# Patient Record
Sex: Male | Born: 1950 | Race: White | Hispanic: No | Smoking: Never smoker
Health system: Southern US, Community
[De-identification: ages and names within clinical notes are randomized; demographics above are authoritative.]

## PROBLEM LIST (undated history)

## (undated) DIAGNOSIS — I480 Paroxysmal atrial fibrillation: Secondary | ICD-10-CM

## (undated) DIAGNOSIS — F32A Depression, unspecified: Secondary | ICD-10-CM

## (undated) DIAGNOSIS — D126 Benign neoplasm of colon, unspecified: Secondary | ICD-10-CM

## (undated) DIAGNOSIS — K5790 Diverticulosis of intestine, part unspecified, without perforation or abscess without bleeding: Secondary | ICD-10-CM

## (undated) DIAGNOSIS — E538 Deficiency of other specified B group vitamins: Secondary | ICD-10-CM

## (undated) DIAGNOSIS — E559 Vitamin D deficiency, unspecified: Secondary | ICD-10-CM

## (undated) DIAGNOSIS — H269 Unspecified cataract: Secondary | ICD-10-CM

## (undated) DIAGNOSIS — R635 Abnormal weight gain: Secondary | ICD-10-CM

## (undated) DIAGNOSIS — Z9889 Other specified postprocedural states: Secondary | ICD-10-CM

## (undated) DIAGNOSIS — F329 Major depressive disorder, single episode, unspecified: Secondary | ICD-10-CM

## (undated) DIAGNOSIS — E785 Hyperlipidemia, unspecified: Secondary | ICD-10-CM

## (undated) DIAGNOSIS — A770 Spotted fever due to Rickettsia rickettsii: Secondary | ICD-10-CM

## (undated) DIAGNOSIS — F419 Anxiety disorder, unspecified: Secondary | ICD-10-CM

## (undated) DIAGNOSIS — G459 Transient cerebral ischemic attack, unspecified: Secondary | ICD-10-CM

## (undated) DIAGNOSIS — M419 Scoliosis, unspecified: Secondary | ICD-10-CM

## (undated) DIAGNOSIS — I10 Essential (primary) hypertension: Secondary | ICD-10-CM

## (undated) DIAGNOSIS — C61 Malignant neoplasm of prostate: Secondary | ICD-10-CM

## (undated) HISTORY — DX: Vitamin D deficiency, unspecified: E55.9

## (undated) HISTORY — DX: Hyperlipidemia, unspecified: E78.5

## (undated) HISTORY — DX: Anxiety disorder, unspecified: F41.9

## (undated) HISTORY — DX: Spotted fever due to Rickettsia rickettsii: A77.0

## (undated) HISTORY — PX: HERNIA REPAIR: SHX51

## (undated) HISTORY — DX: Malignant neoplasm of prostate: C61

## (undated) HISTORY — DX: Essential (primary) hypertension: I10

## (undated) HISTORY — DX: Transient cerebral ischemic attack, unspecified: G45.9

## (undated) HISTORY — DX: Major depressive disorder, single episode, unspecified: F32.9

## (undated) HISTORY — DX: Unspecified cataract: H26.9

## (undated) HISTORY — DX: Other specified postprocedural states: Z98.890

## (undated) HISTORY — DX: Abnormal weight gain: R63.5

## (undated) HISTORY — DX: Deficiency of other specified B group vitamins: E53.8

## (undated) HISTORY — DX: Scoliosis, unspecified: M41.9

## (undated) HISTORY — PX: CATARACT EXTRACTION W/ INTRAOCULAR LENS  IMPLANT, BILATERAL: SHX1307

## (undated) HISTORY — PX: NASAL SEPTUM SURGERY: SHX37

## (undated) HISTORY — DX: Diverticulosis of intestine, part unspecified, without perforation or abscess without bleeding: K57.90

## (undated) HISTORY — DX: Paroxysmal atrial fibrillation: I48.0

## (undated) HISTORY — DX: Depression, unspecified: F32.A

## (undated) HISTORY — DX: Benign neoplasm of colon, unspecified: D12.6

---

## 1999-08-22 ENCOUNTER — Ambulatory Visit (HOSPITAL_COMMUNITY): Admission: RE | Admit: 1999-08-22 | Discharge: 1999-08-22 | Payer: Self-pay | Admitting: Cardiology

## 2004-05-04 ENCOUNTER — Encounter (INDEPENDENT_AMBULATORY_CARE_PROVIDER_SITE_OTHER): Payer: Self-pay | Admitting: Specialist

## 2004-05-04 ENCOUNTER — Ambulatory Visit (HOSPITAL_COMMUNITY): Admission: RE | Admit: 2004-05-04 | Discharge: 2004-05-04 | Payer: Self-pay | Admitting: General Surgery

## 2004-12-11 ENCOUNTER — Ambulatory Visit: Payer: Self-pay | Admitting: Internal Medicine

## 2005-01-11 ENCOUNTER — Ambulatory Visit: Payer: Self-pay | Admitting: Gastroenterology

## 2005-01-24 ENCOUNTER — Ambulatory Visit: Payer: Self-pay | Admitting: Gastroenterology

## 2005-01-24 HISTORY — PX: COLONOSCOPY: SHX174

## 2006-11-21 ENCOUNTER — Ambulatory Visit (HOSPITAL_BASED_OUTPATIENT_CLINIC_OR_DEPARTMENT_OTHER): Admission: RE | Admit: 2006-11-21 | Discharge: 2006-11-21 | Payer: Self-pay | Admitting: General Surgery

## 2006-11-21 ENCOUNTER — Encounter (INDEPENDENT_AMBULATORY_CARE_PROVIDER_SITE_OTHER): Payer: Self-pay | Admitting: Specialist

## 2007-01-14 ENCOUNTER — Inpatient Hospital Stay (HOSPITAL_COMMUNITY): Admission: EM | Admit: 2007-01-14 | Discharge: 2007-01-15 | Payer: Self-pay | Admitting: Emergency Medicine

## 2007-10-20 ENCOUNTER — Ambulatory Visit: Payer: Self-pay | Admitting: Internal Medicine

## 2007-10-21 LAB — CONVERTED CEMR LAB
ALT: 14 units/L (ref 0–53)
Albumin: 3.6 g/dL (ref 3.5–5.2)
Bacteria, UA: NEGATIVE
CO2: 30 meq/L (ref 19–32)
Calcium: 9 mg/dL (ref 8.4–10.5)
Cholesterol: 180 mg/dL (ref 0–200)
Creatinine, Ser: 1.1 mg/dL (ref 0.4–1.5)
Crystals: NEGATIVE
GFR calc non Af Amer: 74 mL/min
Glucose, Bld: 112 mg/dL — ABNORMAL HIGH (ref 70–99)
HCT: 44.4 % (ref 39.0–52.0)
HDL: 54.4 mg/dL (ref >39.0)
Hemoglobin: 15.6 g/dL (ref 13.0–17.0)
Ketones, ur: NEGATIVE mg/dL
LDL Cholesterol: 114 mg/dL — ABNORMAL HIGH (ref 0–99)
Leukocytes, UA: NEGATIVE
Lymphocytes Relative: 20.6 % (ref 12.0–46.0)
MCHC: 35.1 g/dL (ref 30.0–36.0)
MCV: 98.2 fL (ref 78.0–100.0)
Nitrite: NEGATIVE
Potassium: 4.6 meq/L (ref 3.5–5.1)
RBC: 4.52 M/uL (ref 4.22–5.81)
RDW: 11.7 % (ref 11.5–14.6)
Sodium: 138 meq/L (ref 135–145)
Specific Gravity, Urine: >=1.03 (ref 1.000–1.03)
TSH: 0.87 microintl units/mL (ref 0.35–5.50)
Urine Glucose: NEGATIVE mg/dL
WBC: 4.7 10*3/uL (ref 4.5–10.5)

## 2007-10-24 ENCOUNTER — Ambulatory Visit: Payer: Self-pay | Admitting: Internal Medicine

## 2007-10-24 DIAGNOSIS — I4891 Unspecified atrial fibrillation: Secondary | ICD-10-CM | POA: Insufficient documentation

## 2007-10-24 DIAGNOSIS — R972 Elevated prostate specific antigen [PSA]: Secondary | ICD-10-CM | POA: Insufficient documentation

## 2007-10-24 DIAGNOSIS — E785 Hyperlipidemia, unspecified: Secondary | ICD-10-CM

## 2007-10-24 DIAGNOSIS — N529 Male erectile dysfunction, unspecified: Secondary | ICD-10-CM | POA: Insufficient documentation

## 2007-10-24 DIAGNOSIS — N419 Inflammatory disease of prostate, unspecified: Secondary | ICD-10-CM | POA: Insufficient documentation

## 2007-10-24 DIAGNOSIS — F329 Major depressive disorder, single episode, unspecified: Secondary | ICD-10-CM | POA: Insufficient documentation

## 2008-03-02 ENCOUNTER — Ambulatory Visit: Payer: Self-pay | Admitting: Internal Medicine

## 2008-03-02 DIAGNOSIS — M66259 Spontaneous rupture of extensor tendons, unspecified thigh: Secondary | ICD-10-CM

## 2008-04-20 ENCOUNTER — Ambulatory Visit: Payer: Self-pay | Admitting: Internal Medicine

## 2008-04-20 LAB — CONVERTED CEMR LAB
BUN: 11 mg/dL (ref 6–23)
Calcium: 8.7 mg/dL (ref 8.4–10.5)
Creatinine, Ser: 1 mg/dL (ref 0.4–1.5)
Glucose, Bld: 108 mg/dL — ABNORMAL HIGH (ref 70–99)
Hgb A1c MFr Bld: 5.4 % (ref 4.6–6.0)

## 2008-04-23 ENCOUNTER — Ambulatory Visit: Payer: Self-pay | Admitting: Internal Medicine

## 2008-06-07 ENCOUNTER — Telehealth: Payer: Self-pay | Admitting: Internal Medicine

## 2008-07-09 ENCOUNTER — Telehealth: Payer: Self-pay | Admitting: Internal Medicine

## 2008-07-10 ENCOUNTER — Ambulatory Visit: Payer: Self-pay | Admitting: Internal Medicine

## 2008-07-10 DIAGNOSIS — R197 Diarrhea, unspecified: Secondary | ICD-10-CM

## 2008-07-11 ENCOUNTER — Encounter: Payer: Self-pay | Admitting: Internal Medicine

## 2008-07-12 ENCOUNTER — Telehealth: Payer: Self-pay | Admitting: Internal Medicine

## 2008-07-22 ENCOUNTER — Encounter: Payer: Self-pay | Admitting: Internal Medicine

## 2008-07-26 ENCOUNTER — Encounter: Payer: Self-pay | Admitting: Internal Medicine

## 2008-09-07 ENCOUNTER — Encounter: Payer: Self-pay | Admitting: Internal Medicine

## 2008-09-15 ENCOUNTER — Encounter: Payer: Self-pay | Admitting: Internal Medicine

## 2008-11-12 HISTORY — PX: PROSTATECTOMY: SHX69

## 2008-11-18 ENCOUNTER — Encounter (INDEPENDENT_AMBULATORY_CARE_PROVIDER_SITE_OTHER): Payer: Self-pay | Admitting: Urology

## 2008-11-18 ENCOUNTER — Inpatient Hospital Stay (HOSPITAL_COMMUNITY): Admission: RE | Admit: 2008-11-18 | Discharge: 2008-11-19 | Payer: Self-pay | Admitting: Urology

## 2008-11-26 ENCOUNTER — Encounter: Payer: Self-pay | Admitting: Internal Medicine

## 2008-12-17 ENCOUNTER — Telehealth: Payer: Self-pay | Admitting: Internal Medicine

## 2009-08-10 ENCOUNTER — Telehealth: Payer: Self-pay | Admitting: Internal Medicine

## 2009-08-11 ENCOUNTER — Ambulatory Visit: Payer: Self-pay | Admitting: Internal Medicine

## 2009-08-11 LAB — CONVERTED CEMR LAB
AST: 15 units/L (ref 0–37)
Albumin: 3.9 g/dL (ref 3.5–5.2)
Alkaline Phosphatase: 53 units/L (ref 39–117)
Bilirubin, Direct: 0.2 mg/dL (ref 0.0–0.3)
CO2: 33 meq/L — ABNORMAL HIGH (ref 19–32)
Calcium: 8.7 mg/dL (ref 8.4–10.5)
Cholesterol: 177 mg/dL (ref 0–200)
Eosinophils Absolute: 0 10*3/uL (ref 0.0–0.7)
Eosinophils Relative: 0.9 % (ref 0.0–5.0)
Glucose, Bld: 97 mg/dL (ref 70–99)
Ketones, ur: NEGATIVE mg/dL
LDL Cholesterol: 114 mg/dL — ABNORMAL HIGH (ref 0–99)
Lymphocytes Relative: 26.5 % (ref 12.0–46.0)
MCHC: 34.2 g/dL (ref 30.0–36.0)
MCV: 101.4 fL — ABNORMAL HIGH (ref 78.0–100.0)
Monocytes Absolute: 0.3 10*3/uL (ref 0.1–1.0)
Platelets: 145 10*3/uL — ABNORMAL LOW (ref 150.0–400.0)
RDW: 12.2 % (ref 11.5–14.6)
Sodium: 140 meq/L (ref 135–145)
Total Protein: 6.4 g/dL (ref 6.0–8.3)
Urobilinogen, UA: 0.2 (ref 0.0–1.0)
WBC: 4.1 10*3/uL — ABNORMAL LOW (ref 4.5–10.5)

## 2009-08-15 ENCOUNTER — Ambulatory Visit: Payer: Self-pay | Admitting: Internal Medicine

## 2009-08-15 DIAGNOSIS — R490 Dysphonia: Secondary | ICD-10-CM | POA: Insufficient documentation

## 2009-08-15 DIAGNOSIS — R799 Abnormal finding of blood chemistry, unspecified: Secondary | ICD-10-CM | POA: Insufficient documentation

## 2009-08-15 DIAGNOSIS — Z8546 Personal history of malignant neoplasm of prostate: Secondary | ICD-10-CM

## 2009-08-18 LAB — CONVERTED CEMR LAB: Vitamin B-12: 191 pg/mL — ABNORMAL LOW (ref 211–911)

## 2009-08-19 ENCOUNTER — Ambulatory Visit: Payer: Self-pay | Admitting: Internal Medicine

## 2009-08-19 DIAGNOSIS — E538 Deficiency of other specified B group vitamins: Secondary | ICD-10-CM | POA: Insufficient documentation

## 2009-09-05 ENCOUNTER — Ambulatory Visit: Payer: Self-pay | Admitting: Internal Medicine

## 2009-09-19 ENCOUNTER — Encounter: Payer: Self-pay | Admitting: Internal Medicine

## 2009-11-29 ENCOUNTER — Ambulatory Visit: Payer: Self-pay | Admitting: Internal Medicine

## 2009-12-01 LAB — CONVERTED CEMR LAB
Basophils Absolute: 0 10*3/uL (ref 0.0–0.1)
CO2: 31 meq/L (ref 19–32)
Chloride: 108 meq/L (ref 96–112)
HCT: 48 % (ref 39.0–52.0)
Hemoglobin: 15.9 g/dL (ref 13.0–17.0)
Lymphs Abs: 0.9 10*3/uL (ref 0.7–4.0)
MCV: 100.5 fL — ABNORMAL HIGH (ref 78.0–100.0)
Monocytes Relative: 6.8 % (ref 3.0–12.0)
Neutro Abs: 2.5 10*3/uL (ref 1.4–7.7)
Potassium: 4.3 meq/L (ref 3.5–5.1)
Sodium: 142 meq/L (ref 135–145)
Vitamin B-12: 467 pg/mL (ref 211–911)
WBC: 3.7 10*3/uL — ABNORMAL LOW (ref 4.5–10.5)

## 2009-12-09 ENCOUNTER — Encounter: Payer: Self-pay | Admitting: Internal Medicine

## 2010-01-14 ENCOUNTER — Ambulatory Visit: Payer: Self-pay | Admitting: Family Medicine

## 2010-01-14 DIAGNOSIS — J159 Unspecified bacterial pneumonia: Secondary | ICD-10-CM | POA: Insufficient documentation

## 2010-01-16 ENCOUNTER — Telehealth: Payer: Self-pay | Admitting: Internal Medicine

## 2010-02-10 ENCOUNTER — Ambulatory Visit: Payer: Self-pay | Admitting: Internal Medicine

## 2010-02-10 DIAGNOSIS — R002 Palpitations: Secondary | ICD-10-CM

## 2010-02-13 DIAGNOSIS — F411 Generalized anxiety disorder: Secondary | ICD-10-CM

## 2010-02-13 DIAGNOSIS — F419 Anxiety disorder, unspecified: Secondary | ICD-10-CM | POA: Insufficient documentation

## 2010-03-21 ENCOUNTER — Ambulatory Visit: Payer: Self-pay | Admitting: Internal Medicine

## 2010-03-22 LAB — CONVERTED CEMR LAB
CO2: 31 meq/L (ref 19–32)
Calcium: 8.8 mg/dL (ref 8.4–10.5)
GFR calc non Af Amer: 71.03 mL/min (ref >60)
TSH: 1.04 microintl units/mL (ref 0.35–5.50)
Vitamin B-12: 499 pg/mL (ref 211–911)

## 2010-03-30 ENCOUNTER — Ambulatory Visit: Payer: Self-pay | Admitting: Internal Medicine

## 2010-03-30 DIAGNOSIS — E559 Vitamin D deficiency, unspecified: Secondary | ICD-10-CM | POA: Insufficient documentation

## 2010-06-02 ENCOUNTER — Encounter: Payer: Self-pay | Admitting: Internal Medicine

## 2010-08-08 ENCOUNTER — Ambulatory Visit: Payer: Self-pay | Admitting: Cardiology

## 2010-08-11 ENCOUNTER — Telehealth: Payer: Self-pay | Admitting: Internal Medicine

## 2010-08-15 ENCOUNTER — Ambulatory Visit: Payer: Self-pay | Admitting: Internal Medicine

## 2010-09-20 ENCOUNTER — Ambulatory Visit: Payer: Self-pay | Admitting: Internal Medicine

## 2010-11-07 ENCOUNTER — Ambulatory Visit
Admission: RE | Admit: 2010-11-07 | Discharge: 2010-11-07 | Payer: Self-pay | Source: Home / Self Care | Attending: Internal Medicine | Admitting: Internal Medicine

## 2010-11-07 LAB — CONVERTED CEMR LAB
ALT: 16 units/L (ref 0–53)
BUN: 23 mg/dL (ref 6–23)
CO2: 30 meq/L (ref 19–32)
Calcium: 8.6 mg/dL (ref 8.4–10.5)
Cholesterol: 187 mg/dL (ref 0–200)
Glucose, Bld: 86 mg/dL (ref 70–99)
HCT: 46.3 % (ref 39.0–52.0)
Hemoglobin: 15.9 g/dL (ref 13.0–17.0)
Leukocytes, UA: NEGATIVE
MCHC: 34.3 g/dL (ref 30.0–36.0)
Monocytes Absolute: 0.3 10*3/uL (ref 0.1–1.0)
Neutrophils Relative %: 82 % — ABNORMAL HIGH (ref 43.0–77.0)
Nitrite: NEGATIVE
PSA: 0.01 ng/mL — ABNORMAL LOW (ref 0.10–4.00)
Platelets: 173 10*3/uL (ref 150.0–400.0)
RDW: 13.3 % (ref 11.5–14.6)
Sodium: 139 meq/L (ref 135–145)
Specific Gravity, Urine: >=1.03 (ref 1.000–1.030)
TSH: 0.46 microintl units/mL (ref 0.35–5.50)
Total CHOL/HDL Ratio: 4
Total Protein, Urine: NEGATIVE mg/dL
Urine Glucose: NEGATIVE mg/dL
pH: 5.5 (ref 5.0–8.0)

## 2010-11-08 ENCOUNTER — Ambulatory Visit: Payer: Self-pay | Admitting: Internal Medicine

## 2010-11-08 ENCOUNTER — Encounter: Payer: Self-pay | Admitting: Internal Medicine

## 2010-12-06 ENCOUNTER — Encounter: Payer: Self-pay | Admitting: Internal Medicine

## 2010-12-12 NOTE — Assessment & Plan Note (Signed)
Summary: 4 MO ROV /NWS  #   Vital Signs:  Patient profile:   60 year old male Height:      68 inches Weight:      160 pounds BMI:     24.42 O2 Sat:      97 % on Room air Temp:     97.9 degrees F oral Pulse rate:   58 / minute BP sitting:   120 / 72  (left arm) Cuff size:   large  Vitals Entered By: Lucious Groves (Mar 30, 2010 10:13 AM)  O2 Flow:  Room air CC: 4 mo rtn ov/follow up./kb Is Patient Diabetic? No Pain Assessment Patient in pain? no        Primary Care Provider:  Georgina Quint Plotnikov MD  CC:  4 mo rtn ov/follow up./kb.  History of Present Illness: F/u anxiety, depression, palpitations  Current Medications (verified): 1)  Lipitor 10 Mg Tabs (Atorvastatin Calcium) .Marland Kitchen.. 1 By Mouth Qd 2)  Aspir-Low 81 Mg Tbec (Aspirin) .Marland Kitchen.. 1 Once Daily Pc 3)  Flecainide Acetate 100 Mg  Tabs (Flecainide Acetate) .Marland Kitchen.. 1 By Mouth Bid 4)  Atenolol 50 Mg Tabs (Atenolol) .Marland Kitchen.. 1 By Mouth Qd 5)  Levitra 20 Mg Tabs (Vardenafil Hcl) .... 1/2-1 Once Daily As Needed 6)  Vitamin D3 1000 Unit  Tabs (Cholecalciferol) .Marland Kitchen.. 1 By Mouth Daily 7)  Nascobal 500 Mcg/0.59ml Soln (Cyanocobalamin) .... As Dirrected 8)  Zoloft 100 Mg Tabs (Sertraline Hcl) .Marland Kitchen.. 1 By Mouth Qd 9)  Alprazolam 0.5 Mg Tabs (Alprazolam) .Marland Kitchen.. 1 By Mouth Two Times A Day As Needed Anxiety  Allergies (verified): 1)  ! Clonidine Hcl 2)  ! 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    Physical Exam  General:   Well-developed,well-nourished,in no acute distress; alert,appropriate and cooperative throughout examination Eyes:  No corneal or conjunctival inflammation noted. EOMI. Perrla.  Nose:  no external deformity.   Mouth:  Oral mucosa and oropharynx without lesions or exudates.  Teeth in good repair. Lungs:  Normal respiratory effort, chest expands symmetrically. bilateral crackles in lung bases. No rhonchi. No wheezes. Heart:  Normal rate and regular rhythm. S1 and S2 normal without gallop, murmur, click, rub or other extra sounds. Abdomen:  Bowel sounds positive,abdomen soft and non-tender without masses, organomegaly or hernias noted. Msk:  LS NT Extremities:  No clubbing, cyanosis, edema, or deformity noted with normal full range of motion of all joints.   Neurologic:  alert & oriented X3 and gait normal.   Skin:  turgor normal, color normal, no rashes, and no suspicious lesions.   Psych:  Cognition and judgment appear intact. Alert and cooperative with normal attention span and concentration. No apparent delusions, illusions, hallucinations   Impression & Recommendations:  Problem # 1:  ANXIETY (ICD-300.00) Assessment Improved  His updated medication list for this problem includes:    Zoloft 100 Mg Tabs (Sertraline hcl) .Marland Kitchen... 1 by mouth qd    Alprazolam 0.5 Mg Tabs (Alprazolam) .Marland Kitchen... 1 by mouth two times a day as needed anxiety  Problem #  2:  B12 DEFICIENCY (ICD-266.2) Assessment: Improved  Problem # 3:  DEPRESSION (ICD-311) Assessment: Improved  His updated medication list for this problem includes:    Zoloft 100 Mg Tabs (Sertraline hcl) .Marland Kitchen... 1 by mouth qd    Alprazolam 0.5 Mg Tabs (Alprazolam) .Marland Kitchen... 1 by mouth two times a day as needed anxiety  Problem # 4:  VITAMIN D DEFICIENCY (ICD-268.9) Assessment: Improved The labs were reviewed with the patient. Risks of noncompliance with treatment discussed. Compliance encouraged.  Problem # 5:  PALPITATIONS (ICD-785.1) Assessment:  Improved  His updated medication list for this problem includes:    Atenolol 50 Mg Tabs (Atenolol) .Marland Kitchen... 1 by mouth qd  Problem # 6:  ATRIAL FIBRILLATION (ICD-427.31) Assessment: Unchanged  His updated medication list for this problem includes:    Aspir-low 81 Mg Tbec (Aspirin) .Marland Kitchen... 1 once daily pc    Flecainide Acetate 100 Mg Tabs (Flecainide acetate) .Marland Kitchen... 1 by mouth bid    Atenolol 50 Mg Tabs (Atenolol) .Marland Kitchen... 1 by mouth qd  Complete Medication List: 1)  Lipitor 10 Mg Tabs (Atorvastatin calcium) .Marland Kitchen.. 1 by mouth qd 2)  Aspir-low 81 Mg Tbec (Aspirin) .Marland Kitchen.. 1 once daily pc 3)  Flecainide Acetate 100 Mg Tabs (Flecainide acetate) .Marland Kitchen.. 1 by mouth bid 4)  Atenolol 50 Mg Tabs (Atenolol) .Marland Kitchen.. 1 by mouth qd 5)  Levitra 20 Mg Tabs (Vardenafil hcl) .... 1/2-1 once daily as needed 6)  Vitamin D3 1000 Unit Tabs (Cholecalciferol) .Marland Kitchen.. 1 by mouth daily 7)  Nascobal 500 Mcg/0.49ml Soln (Cyanocobalamin) .... As dirrected 8)  Zoloft 100 Mg Tabs (Sertraline hcl) .Marland Kitchen.. 1 by mouth qd 9)  Alprazolam 0.5 Mg Tabs (Alprazolam) .Marland Kitchen.. 1 by mouth two times a day as needed anxiety  Patient Instructions: 1)  Please schedule a follow-up appointment in 6 months well w/labs and vit D v70.0  268.9. Prescriptions: LIPITOR 10 MG TABS (ATORVASTATIN CALCIUM) 1 by mouth qd  #30 x 12   Entered and Authorized by:   [name] MD   Signed by:   [name] MD on 03/30/2010   Method used:   Print then Give to Patient   RxID:   [id number]

## 2010-12-12 NOTE — Assessment & Plan Note (Signed)
Summary: PER KELLY--INCREASE BP--DIFFICULTY EATING--STC   Vital Signs:  Patient profile:   60 year old male Height:      68 inches Weight:      161 pounds BMI:     24.57 O2 Sat:      97 % on Room air Temp:     97.0 degrees F oral Pulse rate:   56 / minute Pulse rhythm:   regular BP sitting:   118 / 70  (left arm) Cuff size:   large  Vitals Entered By: Rock Nephew CMA (February 10, 2010 4:15 PM)  O2 Flow:  Room air CC: discuss lack of appetite and anxiety Is Patient Diabetic? No Pain Assessment Patient in pain? no        Primary Care Provider:  Georgina Quint Plotnikov MD  CC:  discuss lack of appetite and anxiety.  History of Present Illness: C/o anxiety - worse C/o palpitations; had a CP on Fri or Sat x 1 sec. He had a CL 1-1.5 y ago.  Current Medications (verified): 1)  Lipitor 10 Mg Tabs (Atorvastatin Calcium) .Marland Kitchen.. 1 By Mouth Qd 2)  Aspir-Low 81 Mg Tbec (Aspirin) .Marland Kitchen.. 1 Once Daily Pc 3)  Flecainide Acetate 100 Mg  Tabs (Flecainide Acetate) .Marland Kitchen.. 1 By Mouth Bid 4)  Atenolol 50 Mg Tabs (Atenolol) .Marland Kitchen.. 1 By Mouth Qd 5)  Levitra 20 Mg Tabs (Vardenafil Hcl) .... 1/2-1 Once Daily As Needed 6)  Zoloft 50 Mg Tabs (Sertraline Hcl) .Marland Kitchen.. 1 By Mouth Daily 7)  Vitamin D3 1000 Unit  Tabs (Cholecalciferol) .Marland Kitchen.. 1 By Mouth Daily 8)  Nascobal 500 Mcg/0.77ml Soln (Cyanocobalamin) .... As Dirrected  Allergies (verified): 1)  ! Clonidine Hcl 2)  ! 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    Physical Exam  General:   Well-developed,well-nourished,in no acute distress; alert,appropriate and cooperative throughout examination Nose:  no external deformity.   Mouth:  Oral mucosa and oropharynx without lesions or exudates.  Teeth in good repair. Neck:  No deformities, masses, or tenderness noted. Lungs:  Normal respiratory effort, chest expands symmetrically. bilateral crackles in lung bases. No rhonchi. No wheezes. Heart:  Normal rate and regular rhythm. S1 and S2 normal without gallop, murmur, click, rub or other extra sounds. Abdomen:  Bowel sounds positive,abdomen soft and non-tender without masses, organomegaly or hernias noted. Msk:  LS NT Neurologic:  alert & oriented X3 and gait normal.   Skin:  turgor normal, color normal, no rashes, and no suspicious lesions.   Psych:  Cognition and judgment appear intact. Alert and cooperative with normal attention span and concentration. No apparent delusions, illusions, hallucinations   Impression & Recommendations:  Problem # 1:  ATRIAL FIBRILLATION (ICD-427.31) - in NSR Assessment Improved  His updated medication list for this problem includes:    Aspir-low 81 Mg Tbec (Aspirin) .Marland Kitchen... 1 once daily pc    Flecainide Acetate 100 Mg Tabs (Flecainide acetate) .Marland Kitchen... 1 by mouth bid    Atenolol 50 Mg Tabs (Atenolol) .Marland Kitchen... 1 by mouth qd  Problem # 2:  B12 DEFICIENCY (ICD-266.2) Assessment: Improved On prescription/OTC  therapy  Problem # 3:  DEPRESSION (ICD-311)/ anxiety Assessment: Deteriorated  Alprazolam prn Increase Zoloft  Complete Medication List: 1)  Lipitor 10 Mg Tabs (Atorvastatin calcium) .Marland Kitchen.. 1 by mouth qd 2)  Aspir-low 81 Mg Tbec (Aspirin) .Marland Kitchen.. 1 once daily pc 3)  Flecainide Acetate 100 Mg Tabs (Flecainide acetate) .Marland Kitchen.. 1 by mouth bid 4)  Atenolol 50 Mg Tabs (Atenolol) .Marland Kitchen.. 1 by mouth qd 5)  Levitra 20 Mg Tabs (Vardenafil hcl) .... 1/2-1 once daily as needed 6)  Vitamin D3 1000 Unit Tabs (Cholecalciferol) .Marland Kitchen.. 1 by mouth daily 7)  Nascobal 500  Mcg/0.87ml Soln (Cyanocobalamin) .... As dirrected 8)  Zoloft 100 Mg Tabs (Sertraline hcl) .Marland Kitchen.. 1 by mouth qd 9)  Alprazolam 0.5 Mg Tabs (Alprazolam) .Marland Kitchen.. 1 by mouth two times a day as needed anxiety  Other Orders: EKG w/ Interpretation (93000)  Patient Instructions: 1)  Please schedule a follow-up appointment in 3 months. 2)  BMP prior to visit, ICD-9: 3)  Vit B12 4)  TSH 266.20 786.50 Prescriptions: ALPRAZOLAM 0.5 MG TABS (ALPRAZOLAM) 1 by mouth two times a day as needed anxiety  #60 x 1   Entered and Authorized by:   [name] MD   Signed by:   [name] MD on 02/10/2010   Method used:   Print then Give to Patient   RxID:   [id number] ZOLOFT 100 MG TABS (SERTRALINE HCL) 1 by mouth qd  #30 x 6   Entered and Authorized by:   [name] MD   Signed by:   [name] MD on 02/10/2010   Method used:   Print then Give to Patient   RxID:   [id number]   Laboratory Results

## 2010-12-12 NOTE — Assessment & Plan Note (Signed)
Summary: 3 MOS F/U #/CD   Vital Signs:  Patient profile:   60 year old male Weight:      163 pounds Temp:     96.7 degrees F oral Pulse rate:   65 / minute BP sitting:   144 / 76  (left arm)  Vitals Entered By: Tora Perches (November 29, 2009 9:38 AM) CC: f/u Is Patient Diabetic? No   Primary Care Provider:  [name] MD  CC:  f/u.  History of Present Illness: F/u  B12 def, A fib, prostate CA  Preventive Screening-Counseling & Management  Alcohol-Tobacco     Smoking Status: never  Current Medications (verified): 1)  Lipitor 10 Mg Tabs (Atorvastatin Calcium) .Marland Kitchen.. 1 By Mouth Qd 2)  Aspir-Low 81 Mg Tbec (Aspirin) .Marland Kitchen.. 1 Once Daily Pc 3)  Flecainide Acetate 100 Mg  Tabs (Flecainide Acetate) .Marland Kitchen.. 1 By Mouth Bid 4)  Atenolol 50 Mg Tabs (Atenolol) .Marland Kitchen.. 1 By Mouth Qd 5)  Levitra 20 Mg Tabs (Vardenafil Hcl) .... 1/2-1 Once Daily As Needed 6)  Zoloft 50 Mg Tabs (Sertraline Hcl) .Marland Kitchen.. 1 By Mouth Daily 7)  Vitamin D3 1000 Unit  Tabs (Cholecalciferol) .Marland Kitchen.. 1 By Mouth Daily 8)  Calomist .... 1 Spr Each Nostril Qd  Allergies: 1)  ! Clonidine Hcl 2)  ! 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  The patient denies fever, abdominal pain, melena, muscle weakness, difficulty walking, and breast masses.         Better  Physical Exam  General:  Well-developed,well-nourished,in no acute distress; alert,appropriate and cooperative throughout examination Mouth:  Oral mucosa and  oropharynx without lesions or exudates.  Teeth in good repair. Neck:  No deformities, masses, or tenderness noted. Heart:  Normal rate and regular rhythm. S1 and S2 normal without gallop, murmur, click, rub or other extra sounds. Abdomen:  Bowel sounds positive,abdomen soft and non-tender without masses, organomegaly or hernias noted. Msk:  LS NT Neurologic:  alert & oriented X3 and gait normal.  cranial nerves II-XII intact, strength normal in all extremities, and DTRs symmetrical and normal.   Skin:  turgor normal, color normal, no rashes, and no suspicious lesions.   Psych:  Oriented X3, not depressed appearing, not agitated, and not suicidal.     Impression & Recommendations:  Problem # 1:  B12 DEFICIENCY (ICD-266.2) Assessment Improved  On prescription therapy   Orders: TLB-B12, Serum-Total ONLY (16109-U04) TLB-CBC Platelet - w/Differential (85025-CBCD) TLB-BMP (Basic Metabolic Panel-BMET) (80048-METABOL)  Problem # 2:  CBC, ABNORMAL (ICD-790.99) Assessment: Comment Only Get labs  Problem # 3:  PROSTATE CANCER, HX OF (ICD-V10.46) Assessment: Comment Only  Problem # 4:  ATRIAL FIBRILLATION (ICD-427.31) Assessment: Unchanged  His updated medication list for this problem includes:    Aspir-low 81 Mg Tbec (Aspirin) .Marland Kitchen... 1 once daily pc    Flecainide Acetate 100 Mg Tabs (Flecainide acetate) .Marland Kitchen... 1 by mouth bid    Atenolol 50 Mg Tabs (Atenolol) .Marland Kitchen... 1 by mouth qd  Complete Medication List: 1)  Lipitor 10 Mg Tabs (Atorvastatin calcium) .Marland KitchenMarland KitchenMarland Kitchen  1 by mouth qd 2)  Aspir-low 81 Mg Tbec (Aspirin) .Marland Kitchen.. 1 once daily pc 3)  Flecainide Acetate 100 Mg Tabs (Flecainide acetate) .Marland Kitchen.. 1 by mouth bid 4)  Atenolol 50 Mg Tabs (Atenolol) .Marland Kitchen.. 1 by mouth qd 5)  Levitra 20 Mg Tabs (Vardenafil hcl) .... 1/2-1 once daily as needed 6)  Zoloft 50 Mg Tabs (Sertraline hcl) .Marland Kitchen.. 1 by mouth daily 7)  Vitamin D3 1000 Unit Tabs (Cholecalciferol) .Marland Kitchen.. 1 by mouth daily 8)  Nascobal 500 Mcg/0.27ml Soln  (Cyanocobalamin) .... As dirrected  Patient Instructions: 1)  Please schedule a follow-up appointment in 4 months. 2)  BMP prior to visit, ICD-9: 3)  TSH prior to visit, ICD-9:266.20 4)  Vit D Prescriptions: NASCOBAL 500 MCG/0.1ML SOLN (CYANOCOBALAMIN) as dirrected  #1 x 12   Entered and Authorized by:   [name] MD   Signed by:   [name] MD on 11/29/2009   Method used:   Print then Give to Patient   RxID:   [id number]

## 2010-12-12 NOTE — Progress Notes (Signed)
Summary: Call A NURSE  Phone Note From Other Clinic   Summary of Call: [facility] Triage Call Report [place] Martes Operator: [id number] Triage Record Num: Call Date & Time: Haston Casebolt Patient Name: 01/14/2010 10:53:49AM PCP: [phone] Patient Phone: PCP Fax : Patient Gender: Male 1951-08-31 Patient DOB: Practice Name: [facility] Reason for Call: Pt sts that he has had a fever x 4 days and is requesting appt; declines triage. Transferred to Lupita Leash in office for appt. Office Note Protocol(s) Used: Information Noted and Sent to Office Recommended Outcome per Protocol: Reason for Outcome: Caller information to office Initial call taken by: Lamar Sprinkles, CMA,  January 16, 2010 1:38 PM

## 2010-12-12 NOTE — Assessment & Plan Note (Signed)
Summary: FEVER/COLD/DLO   Vital Signs:  Patient profile:   60 year old male Weight:      165 pounds Temp:     100.4 degrees F oral Pulse rate:   88 / minute Pulse rhythm:   regular BP sitting:   140 / 64  (left arm) Cuff size:   regular  Vitals Entered By: Lowella Petties CMA (January 14, 2010 12:12 PM) CC: Flu like sxs, fever   Primary Care Provider:  [name] MD   History of Present Illness: 60 year old male:  2 weeks ago, got some col dor flu  Wed has had a fever, got up to almost 102 Some congestion and headache.  coughing, has had some  sputum production.  Fever, to a MAXIMUM TEMPERATURE of 102. No nausea, vomiting, diarrhea, minimal bodyaches.  No significant sore throat. No earache.  Review systems as above: No rashes:  No night sweats: Patient is on multiple cardiac medications.Marland Kitchen  He is having some mild nasal congestion  Allergies: 1)  ! Clonidine Hcl 2)  ! Procainamide Hcl (Procainamide Hcl)  Past History:  Past medical, surgical, family and social histories (including risk factors) reviewed, and no changes noted (except as noted below).  Past Medical History: Reviewed history from 11/29/2009 and no changes required.  Atrial fibrillation  Dr Deborah Chalk Depression h/o Hyperlipidemia H/o prostatitis Part rupture of l quad 2008 Prostate cancer 2010 [name] Vit B12 def 2010  Past Surgical History: Reviewed history from 08/15/2009 and no changes required. Prostatectomy, robotic 11/2008  Family History: Reviewed history from 10/24/2007 and no changes required. Family History Lung cancer Family History of Prostate CA 1st degree relative <50  Social History: Reviewed history from 08/19/2009 and no changes required. 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 No apparent alopecia or balding. Ears:  External ear exam shows no significant lesions or deformities.  Otoscopic examination reveals clear canals, tympanic membranes are intact bilaterally without bulging, retraction, inflammation or discharge. Hearing is grossly normal bilaterally. Nose:  no external deformity.   Mouth:  Oral mucosa and oropharynx without lesions or exudates.  Teeth in good repair. Neck:  No deformities, masses, or tenderness noted. Lungs:  Normal respiratory effort, chest expands symmetrically. bilateral crackles in lung bases. No rhonchi. No wheezes. Heart:  Normal rate and regular rhythm. S1 and S2 normal without gallop, murmur, click, rub or other extra sounds. Extremities:  No clubbing, cyanosis, edema, or deformity noted with normal full range of motion of all joints.   Neurologic:  alert & oriented X3 and gait normal.   Cervical Nodes:  No lymphadenopathy noted Psych:  Cognition and judgment appear intact. Alert and cooperative with normal attention span and concentration. No apparent delusions, illusions, hallucinations   Impression & Recommendations:  Problem # 1:  BACTERIAL PNEUMONIA (ICD-482.9) Assessment New the crackles on exam, consistent with pneumonia.  Will treat with high-dose amoxicillin.  No distress and stable for outpatient management.  His updated medication list for this problem includes:    Amoxicillin 875 Mg Tabs (Amoxicillin) .Marland Kitchen... 1 by mouth two times a day  Problem # 2:  ATRIAL FIBRILLATION (ICD-427.31) on flecainide  His updated medication list for this problem includes:    Aspir-low 81 Mg Tbec (Aspirin) .Marland Kitchen... 1 once daily pc  Flecainide Acetate 100 Mg Tabs (Flecainide acetate) .Marland Kitchen... 1 by mouth bid    Atenolol 50 Mg Tabs (Atenolol) .Marland Kitchen... 1 by mouth qd  Complete Medication List: 1)  Lipitor 10 Mg Tabs (Atorvastatin calcium) .Marland Kitchen.. 1 by mouth  qd 2)  Aspir-low 81 Mg Tbec (Aspirin) .Marland Kitchen.. 1 once daily pc 3)  Flecainide Acetate 100 Mg Tabs (Flecainide acetate) .Marland Kitchen.. 1 by mouth bid 4)  Atenolol 50 Mg Tabs (Atenolol) .Marland Kitchen.. 1 by mouth qd 5)  Levitra 20 Mg Tabs (Vardenafil hcl) .... 1/2-1 once daily as needed 6)  Zoloft 50 Mg Tabs (Sertraline hcl) .Marland Kitchen.. 1 by mouth daily 7)  Vitamin D3 1000 Unit Tabs (Cholecalciferol) .Marland Kitchen.. 1 by mouth daily 8)  Nascobal 500 Mcg/0.64ml Soln (Cyanocobalamin) .... As dirrected 9)  Amoxicillin 875 Mg Tabs (Amoxicillin) .Marland Kitchen.. 1 by mouth two times a day Prescriptions: AMOXICILLIN 875 MG TABS (AMOXICILLIN) 1 by mouth two times a day  #20 x 0   Entered and Authorized by:   Hannah Beat MD   Signed by:   Hannah Beat MD on 01/14/2010   Method used:   Electronically to        [facility] (retail)       [address]       [place], [place]  [address]       Ph: [phone] or [phone]       Fax: [phone]   RxID:   [id number]

## 2010-12-12 NOTE — Progress Notes (Signed)
Summary: REQ FOR RX  Phone Note Call from Patient Call back at Home Phone [phone]   Summary of Call: Patient is requesting written prescription - did not give name of med. Also said he may need a b12 shot.  Initial call taken by: Lamar Sprinkles, CMA,  August 11, 2010 3:26 PM  Follow-up for Phone Call        left mess to call office back.................Marland KitchenLamar Sprinkles, CMA  August 11, 2010 5:57 PM   Additional Follow-up for Phone Call Additional follow up Details #1::        MEDICINE IS NASCOBAL 2.300  NEEDS WRITTEN RX TO MAIL ORDER.  ALSO NEEDS TO KNOW IF HE NEED TO COME FOR B12 SHOT TO LAST UNTIL MAIL ORDER COMES. Additional Follow-up by: Hilarie Fredrickson,  August 14, 2010 10:26 AM    Additional Follow-up for Phone Call Additional follow up Details #2::    OK to come for a shot OK Nascobal -pls mail him a discount card too Follow-up by: [name] MD,  August 14, 2010 12:52 PM  Additional Follow-up for Phone Call Additional follow up Details #3:: Details for Additional Follow-up Action Taken: Patient notified/ will pick up rx (upfront). Pt transferred to scheduling for nurse visit Additional Follow-up by: Rock Nephew CMA,  August 14, 2010 4:29 PM  New/Updated Medications: NASCOBAL 500 MCG/0.1ML SOLN (CYANOCOBALAMIN) 1 spr in 1 nostril q 1 wk for Vit B12 deficiency Prescriptions: NASCOBAL 500 MCG/0.1ML SOLN (CYANOCOBALAMIN) 1 spr in 1 nostril q 1 wk for Vit B12 deficiency  #3 x 3   Entered and Authorized by:   [name] MD   Signed by:   Lamar Sprinkles, CMA on 08/14/2010   Method used:     RxID:   [id number]

## 2010-12-12 NOTE — Assessment & Plan Note (Signed)
Summary: b12 shot/plot/cd  Nurse Visit   Allergies: 1)  ! Clonidine Hcl 2)  ! Procainamide Hcl (Procainamide Hcl)  Medication Administration  Injection # 1:    Medication: Vit B12 1000 mcg    Diagnosis: B12 DEFICIENCY (ICD-266.2)    Route: IM    Site: R deltoid    Exp Date: 05/12/2012    Lot #: [id number]    Mfr: [employer]ent    Patient tolerated injection without complications    Given by: Lamar Sprinkles, CMA (August 15, 2010 5:34 PM)  Orders Added: 1)  Vit B12 1000 mcg [J3420] 2)  Admin of Therapeutic Inj  intramuscular or subcutaneous [96372]   Medication Administration  Injection # 1:    Medication: Vit B12 1000 mcg    Diagnosis: B12 DEFICIENCY (ICD-266.2)    Route: IM    Site: R deltoid    Exp Date: 05/12/2012    Lot #: [id number]    Mfr: [employer]ent    Patient tolerated injection without complications    Given by: Lamar Sprinkles, CMA (August 15, 2010 5:34 PM)  Orders Added: 1)  Vit B12 1000 mcg [J3420] 2)  Admin of Therapeutic Inj  intramuscular or subcutaneous [[id number]]

## 2010-12-12 NOTE — Letter (Signed)
Summary: [facility]   Imported By: Sherian Rein 12/20/2009 14:46:46  _____________________________________________________________________  External Attachment:    Type:   Image     Comment:   External Document

## 2010-12-12 NOTE — Letter (Signed)
Summary: [facility]   Imported By: Sherian Rein 06/13/2010 13:12:05  _____________________________________________________________________  External Attachment:    Type:   Image     Comment:   External Document

## 2010-12-14 NOTE — Assessment & Plan Note (Signed)
Summary: 6 mos well/cigna/#/cd   Vital Signs:  Patient profile:   60 year old male Height:      68 inches Weight:      167 pounds BMI:     25.48 Temp:     99.2 degrees F oral Pulse rate:   72 / minute Pulse rhythm:   regular Resp:     16 per minute BP sitting:   150 / 88  (right arm) Cuff size:   regular  Vitals Entered By: Lanier Prude, Beverly Gust) (November 08, 2010 2:18 PM) CC: CPX Is Patient Diabetic? No Comments he is not taking alprazolam   History of Present Illness: The patient presents for a preventive health examination  He fell on a console 2 wks ago - hit L chest, much better now  Current Medications (verified): 1)  Lipitor 10 Mg Tabs (Atorvastatin Calcium) .Marland Kitchen.. 1 By Mouth Qd 2)  Aspir-Low 81 Mg Tbec (Aspirin) .Marland Kitchen.. 1 Once Daily Pc 3)  Flecainide Acetate 100 Mg  Tabs (Flecainide Acetate) .Marland Kitchen.. 1 By Mouth Bid 4)  Atenolol 50 Mg Tabs (Atenolol) .Marland Kitchen.. 1 By Mouth Qd 5)  Levitra 20 Mg Tabs (Vardenafil Hcl) .... 1/2-1 Once Daily As Needed 6)  Vitamin D3 1000 Unit  Tabs (Cholecalciferol) .Marland Kitchen.. 1 By Mouth Daily 7)  Nascobal 500 Mcg/0.80ml Soln (Cyanocobalamin) .... As Dirrected 8)  Zoloft 100 Mg Tabs (Sertraline Hcl) .Marland Kitchen.. 1 By Mouth Qd 9)  Alprazolam 0.5 Mg Tabs (Alprazolam) .Marland Kitchen.. 1 By Mouth Two Times A Day As Needed Anxiety 10)  Nascobal 500 Mcg/0.46ml Soln (Cyanocobalamin) .Marland Kitchen.. 1 Spr in 1 Nostril Q 1 Wk For Vit B12 Deficiency  Allergies (verified): 1)  ! Clonidine Hcl 2)  ! Procainamide Hcl (Procainamide Hcl)  Past History:  Past Medical History: Last updated: 02/10/2010  Atrial fibrillation  Dr Deborah Chalk Depression h/o Hyperlipidemia H/o prostatitis Part rupture of l quad 2008 Prostate cancer 2010 [name] Vit B12 def 2010 Anxiety  Family History: Last updated: 10/24/2007 Family History Lung cancer Family History of Prostate CA 1st degree relative <50  Social History: Last updated: 11/08/2010 Occupation: post office retired 2009; working part  time Divorced Never Smoked   Alcohol use-no Drug use-no Regular exercise-yes  Past Surgical History: Prostatectomy, robotic 11/2008 Colon Dr Jarold Motto  Social History: Occupation: post office retired 2009; working part time Divorced Never Smoked   Alcohol use-no Drug use-no Regular exercise-yes  Review of Systems  The patient denies anorexia, fever, weight loss, weight gain, vision loss, decreased hearing, hoarseness, chest pain, syncope, dyspnea on exertion, peripheral edema, prolonged cough, headaches, hemoptysis, abdominal pain, melena, hematochezia, severe indigestion/heartburn, hematuria, incontinence, genital sores, muscle weakness, suspicious skin lesions, transient blindness, difficulty walking, depression, unusual weight change, abnormal bleeding, enlarged lymph nodes, angioedema, and testicular masses.         BP is OK at home  Physical Exam  General:  Well-developed,well-nourished,in no acute distress; alert,appropriate and cooperative throughout examination Head:  Normocephalic and atraumatic without obvious abnormalities. No apparent alopecia or balding. Eyes:  No corneal or conjunctival inflammation noted. EOMI. Perrla.  Ears:  External ear exam shows no significant lesions or deformities.  Otoscopic examination reveals clear canals, tympanic membranes are intact bilaterally without bulging, retraction, inflammation or discharge. Hearing is grossly normal bilaterally. Nose:  no external deformity.   Mouth:  Oral mucosa and oropharynx without lesions or exudates.  Teeth in good repair. Neck:  No deformities, masses, or tenderness noted. Chest Wall:  L rib is tender lat Lungs:  Normal  respiratory effort, chest expands symmetrically. bilateral crackles in lung bases. No rhonchi. No wheezes. Heart:  Normal rate and regular rhythm. S1 and S2 normal without gallop, murmur, click, rub or other extra sounds. Abdomen:  Bowel sounds positive,abdomen soft and non-tender without  masses, organomegaly or hernias noted. Msk:  LS NT Neurologic:  alert & oriented X3 and gait normal.   Skin:  turgor normal, color normal, no rashes, and no suspicious lesions.   Psych:  Cognition and judgment appear intact. Alert and cooperative with normal attention span and concentration. No apparent delusions, illusions, hallucinations   Impression & Recommendations:  Problem # 1:  HEALTH MAINTENANCE EXAM (ICD-V70.0) Assessment New Health and age related issues were discussed. Available screening tests and vaccinations were discussed as well. Healthy life style including good diet and exercise was discussed.  The labs were reviewed with the patient.   Problem # 2:  B12 DEFICIENCY (ICD-266.2) Assessment: Comment Only On the regimen of medicine(s) reflected in the chart    Problem # 3:  PROSTATE CANCER, HX OF (ICD-V10.46) Dr Laverle Patter appt is pending   Problem # 4:  Rib contusion,better   Complete Medication List: 1)  Lipitor 10 Mg Tabs (Atorvastatin calcium) .Marland Kitchen.. 1 by mouth qd 2)  Aspir-low 81 Mg Tbec (Aspirin) .Marland Kitchen.. 1 once daily pc 3)  Flecainide Acetate 100 Mg Tabs (Flecainide acetate) .Marland Kitchen.. 1 by mouth bid 4)  Atenolol 50 Mg Tabs (Atenolol) .Marland Kitchen.. 1 by mouth qd 5)  Levitra 20 Mg Tabs (Vardenafil hcl) .... 1/2-1 once daily as needed 6)  Vitamin D3 1000 Unit Tabs (Cholecalciferol) .Marland Kitchen.. 1 by mouth daily 7)  Nascobal 500 Mcg/0.67ml Soln (Cyanocobalamin) .... As dirrected 8)  Zoloft 100 Mg Tabs (Sertraline hcl) .Marland Kitchen.. 1 by mouth qd 9)  Alprazolam 0.5 Mg Tabs (Alprazolam) .Marland Kitchen.. 1 by mouth two times a day as needed anxiety 10)  Nascobal 500 Mcg/0.64ml Soln (Cyanocobalamin) .Marland Kitchen.. 1 spr in 1 nostril q 1 wk for vit b12 deficiency  Other Orders: EKG w/ Interpretation (93000) Flu Vaccine 4yrs + (04540) Admin 1st Vaccine (98119)  Patient Instructions: 1)  Please schedule a follow-up appointment in 6 months. 2)  BMP prior to visit, ICD-9: 3)  Lipid Panel prior to visit, ICD-9:272.0 4)  Vit B12  266.20   Orders Added: 1)  EKG w/ Interpretation [93000] 2)  Flu Vaccine 90yrs + [90658] 3)  Admin 1st Vaccine [90471] 4)  Est. Patient age 87-64 [67]   Immunizations Administered:  Influenza Vaccine # 1:    Vaccine Type: Fluvax 3+    Site: left deltoid    Mfr: Sanofi Pasteur    Dose: 0.5 ml    Route: IM    Given by: Lanier Prude, CMA(AAMA)    Exp. Date: 05/12/2011    Lot #: [id number]    VIS given: 06/06/10 version given November 08, 2010.   Immunizations Administered:  Influenza Vaccine # 1:    Vaccine Type: Fluvax 3+    Site: left deltoid    Mfr: Sanofi Pasteur    Dose: 0.5 ml    Route: IM    Given by: Lanier Prude, CMA(AAMA)    Exp. Date: 05/12/2011    Lot #: [id number]    VIS given: 06/06/10 version given November 08, 2010.

## 2010-12-28 NOTE — Letter (Signed)
[facility]   Imported By: Sherian Rein 12/20/2010 11:08:24  _____________________________________________________________________  External Attachment:    Type:   Image     Comment:   External Document

## 2011-01-19 ENCOUNTER — Emergency Department (HOSPITAL_COMMUNITY): Payer: Managed Care, Other (non HMO)

## 2011-01-19 ENCOUNTER — Observation Stay (HOSPITAL_COMMUNITY)
Admission: EM | Admit: 2011-01-19 | Discharge: 2011-01-20 | Disposition: A | Discharge status: Home / Self Care | Payer: Managed Care, Other (non HMO) | Attending: Internal Medicine | Admitting: Internal Medicine

## 2011-01-19 DIAGNOSIS — F329 Major depressive disorder, single episode, unspecified: Secondary | ICD-10-CM | POA: Insufficient documentation

## 2011-01-19 DIAGNOSIS — Z9079 Acquired absence of other genital organ(s): Secondary | ICD-10-CM | POA: Insufficient documentation

## 2011-01-19 DIAGNOSIS — R209 Unspecified disturbances of skin sensation: Principal | ICD-10-CM | POA: Insufficient documentation

## 2011-01-19 DIAGNOSIS — F3289 Other specified depressive episodes: Secondary | ICD-10-CM | POA: Insufficient documentation

## 2011-01-19 DIAGNOSIS — I4891 Unspecified atrial fibrillation: Secondary | ICD-10-CM | POA: Insufficient documentation

## 2011-01-19 DIAGNOSIS — I10 Essential (primary) hypertension: Secondary | ICD-10-CM | POA: Insufficient documentation

## 2011-01-19 DIAGNOSIS — E785 Hyperlipidemia, unspecified: Secondary | ICD-10-CM | POA: Insufficient documentation

## 2011-01-19 DIAGNOSIS — Z79899 Other long term (current) drug therapy: Secondary | ICD-10-CM | POA: Insufficient documentation

## 2011-01-19 LAB — CBC
Hemoglobin: 16.1 g/dL (ref 13.0–17.0)
MCV: 96.3 fL (ref 78.0–100.0)
RBC: 4.88 MIL/uL (ref 4.22–5.81)
WBC: 5.3 10*3/uL (ref 4.0–10.5)

## 2011-01-19 LAB — POCT CARDIAC MARKERS
CKMB, poc: <1 ng/mL — ABNORMAL LOW (ref 1.0–8.0)
Troponin i, poc: <0.05 ng/mL (ref 0.00–0.09)

## 2011-01-19 LAB — DIFFERENTIAL
Basophils Relative: 1 % (ref 0–1)
Eosinophils Absolute: 0 10*3/uL (ref 0.0–0.7)
Eosinophils Relative: 0 % (ref 0–5)
Lymphs Abs: 1.1 10*3/uL (ref 0.7–4.0)
Monocytes Absolute: 0.3 10*3/uL (ref 0.1–1.0)
Neutro Abs: 3.8 10*3/uL (ref 1.7–7.7)

## 2011-01-19 LAB — POCT I-STAT, CHEM 8: Creatinine, Ser: 1.3 mg/dL (ref 0.4–1.5)

## 2011-01-20 ENCOUNTER — Observation Stay (HOSPITAL_COMMUNITY): Payer: Managed Care, Other (non HMO)

## 2011-01-20 DIAGNOSIS — I517 Cardiomegaly: Secondary | ICD-10-CM

## 2011-01-20 LAB — CARDIAC PANEL(CRET KIN+CKTOT+MB+TROPI)
Relative Index: INVALID (ref 0.0–2.5)
Total CK: 55 U/L (ref 7–232)

## 2011-01-20 LAB — URINALYSIS, ROUTINE W REFLEX MICROSCOPIC
Specific Gravity, Urine: 1.028 (ref 1.005–1.030)
Urobilinogen, UA: 1 mg/dL (ref 0.0–1.0)

## 2011-01-20 LAB — CBC
HCT: 45.1 % (ref 39.0–52.0)
Hemoglobin: 15.4 g/dL (ref 13.0–17.0)
RBC: 4.68 MIL/uL (ref 4.22–5.81)
RDW: 12.3 % (ref 11.5–15.5)
WBC: 5 10*3/uL (ref 4.0–10.5)

## 2011-01-20 LAB — COMPREHENSIVE METABOLIC PANEL
ALT: 14 U/L (ref 0–53)
Calcium: 8.5 mg/dL (ref 8.4–10.5)
Glucose, Bld: 119 mg/dL — ABNORMAL HIGH (ref 70–99)
Sodium: 138 mEq/L (ref 135–145)
Total Protein: 5.9 g/dL — ABNORMAL LOW (ref 6.0–8.3)

## 2011-01-20 LAB — LIPID PANEL
Cholesterol: 210 mg/dL — ABNORMAL HIGH (ref 0–200)
HDL: 52 mg/dL (ref >39)
LDL Cholesterol: 142 mg/dL — ABNORMAL HIGH (ref 0–99)
Total CHOL/HDL Ratio: 4 RATIO

## 2011-01-20 LAB — POCT CARDIAC MARKERS
Myoglobin, poc: 62.4 ng/mL (ref 12–200)
Troponin i, poc: <0.05 ng/mL (ref 0.00–0.09)

## 2011-01-20 LAB — APTT: aPTT: 29 seconds (ref 24–37)

## 2011-01-20 LAB — URINE MICROSCOPIC-ADD ON

## 2011-01-20 LAB — GLUCOSE, CAPILLARY: Glucose-Capillary: 83 mg/dL (ref 70–99)

## 2011-01-21 NOTE — Discharge Summary (Signed)
NAME:  Evan Farmer, Evan Farmer                ACCOUNT NO.:  [id number]  MEDICAL RECORD NO.:  [id number]           PATIENT TYPE:  I  LOCATION:  1440                         FACILITY:  [facility]  PHYSICIAN:  Talmage Nap, MD  DATE OF BIRTH:  1951-03-22  DATE OF ADMISSION:  01/19/2011 DATE OF DISCHARGE:  01/20/2011                        DISCHARGE SUMMARY - REFERRING   PRIMARY CARE PHYSICIAN:  Georgina Quint. Plotnikov, MD  CARDIOLOGIST:  Colleen Can. Deborah Chalk, MD  DISCHARGE DIAGNOSES: 1. Numbness/tingling left arm and face, resolved, questionable     transient ischemic attack. 2. Atrial fibrillation. 3. Hypertension. 4. Dyslipidemia. 5. History of depression.  The patient is a very pleasant 60 year old Caucasian male with history of atrial fibrillation status post cardioversion, on flecainide, was admitted to the hospital on January 19, 2011, by Dr. Conley Canal with complaint of numbness of the left arm and the left face.  There was, however, no associated chest pain.  There is no history of shortness of breath.  No fever.  No chills.  No rigor.  In the emergency room, the patient was found to have a markedly elevated blood pressure 197/100 and subsequently was admitted for further evaluation and stabilization.  Preadmission medications without doses include atenolol, Tambocor, and Zoloft.  ALLERGIES: 1. QUININE. 2. PRONESTYL.  PAST SURGICAL HISTORY:  Atrial fibrillation status post cardioversion and prostatectomy.  SOCIAL HISTORY:  Negative for tobacco use.  Occasionally takes alcohol and lives with his son.  FAMILY HISTORY:  York Spaniel to be positive for atrial fibrillation.  REVIEW OF SYSTEMS:  Essentially documented in initial history and physical.  PHYSICAL EXAMINATION:  GENERAL:  At time the patient was seen by the admitting physician, he was said to be very pleasant, not in any distress. VITAL SIGNS:  Blood pressure was 137/82, pulse 60, temperature 98.4, respiratory rate 16,  saturating 97% on room air. HEENT:  Pupils were reactive to light and extraocular muscles are intact. NECK:  No jugular venous distention.  No carotid bruit.  No lymphadenopathy. CHEST:  Clear to auscultation. CARDIAC:  Heart sounds 1 and 2.  No murmur. ABDOMEN:  Soft, nontender.  Liver, spleen tip not palpable.  Bowel sounds are positive. EXTREMITIES:  No pedal edema. NEUROLOGIC:  Nonfocal. MUSCULOSKELETAL SYSTEM:  Unremarkable. SKIN:  Normal turgor.  LABORATORY DATA:  Chemistry stat showed sodium of 142, potassium of 4.1, chloride of 103, glucose is 101, BUN is 19, creatinine is 1.3.  Complete blood count with differential showed a WBC of 5.3, hemoglobin of 16.1, hematocrit of 7.0, MCV of 96.3 with a platelet count of 179.  Cardiac markers, troponin-I less than 0.05 and 0.01. Urinalysis unremarkable. Urine microscopy unremarkable.  Coagulation profile showed PT 13.5, INR 1.01, and APTT of 29.  Lipid panel showed total cholesterol 210, triglyceride 80, HDL 52, LDL 142, hemoglobin A1c 5.2.  A repeat complete blood count with no differential done on January 19, 2009, showed WBC of 5.0, hemoglobin of 15.4, hematocrit of 45.1, MCV of 96.4 with a platelet count of 146.  Comprehensive metabolic panel showed sodium of 138, potassium of 3.9, chloride of 105 with a bicarbonate of 27, glucose is  101, BUN is 16, creatinine is 1.08.  EKG done on admission was said to have showed normal sinus rhythm with no significant ST-wave changes.  IMAGING STUDIES DONE:  Chest x-ray which showed hyperinflated lungs with severe rotatory scoliosis, no acute cardiopulmonary process seen.  CT of the head without contrast did not show any acute infarct.  MRI of the head without contrast showed no acute infarct. MRI of the brain without contrast showed no aneurysm.  There are scattered minimal nonspecific white matter changes.  A 2-D echo at time of this discharge summary was pending.  HOSPITAL COURSE:  The  patient was admitted to the cardiac tele, he had neuro checks done q.2 h. and he was also given atenolol 50 mg p.o. daily.  Other medication given to the patient include Lipitor 10 mg p.o. nightly, Protonix 40 mg p.o. daily, Tambocor/flecainide 100 mg p.o. b.i.d., Zoloft 50 mg p.o. daily, Zocor 20 mg p.o. daily, and also aspirin 325 mg p.o. daily.  The patient was seen by me for the very first time in this admission today, denied any tingling sensation in his left arm or left face.  The patient also said he would like to be discharged because he had a wedding to attend.  Examination of the patient was essentially unremarkable.  His vital signs blood pressure is 137/72, temperature 97.9, pulse 65, respiratory rate 18, medically stable.  Plan is for the patient to be discharged home today on activity as tolerated.  Low-sodium, low-cholesterol diet.  Follow up with his primary care physician as well as his cardiologist in 1-2 weeks. Medication to be taken at home will include simvastatin and Zocor 20 mg p.o. daily, atenolol 50 mg p.o. b.i.d., aspirin 325 mg p.o. daily, flecainide/Tambocor 100 mg 1 p.o. b.i.d., cyanocobalamin nasal spray 1 spray weekly, and Zoloft (sertraline) 100 mg 1 p.o. daily.     Talmage Nap, MD     CN/MEDQ  D:  01/20/2011  T:  01/20/2011  Job:  [id number]  cc:   Georgina Quint. Plotnikov, MD [address] [place] [address]  Colleen Can. Deborah Chalk, M.D. Fax: [phone]  Electronically Signed by Talmage Nap  on 01/21/2011 06:29:15 AM

## 2011-01-25 ENCOUNTER — Encounter: Payer: Self-pay | Admitting: Cardiology

## 2011-01-25 DIAGNOSIS — I480 Paroxysmal atrial fibrillation: Secondary | ICD-10-CM | POA: Insufficient documentation

## 2011-01-25 DIAGNOSIS — R635 Abnormal weight gain: Secondary | ICD-10-CM | POA: Insufficient documentation

## 2011-02-02 ENCOUNTER — Encounter: Payer: Self-pay | Admitting: Nurse Practitioner

## 2011-02-02 ENCOUNTER — Ambulatory Visit (INDEPENDENT_AMBULATORY_CARE_PROVIDER_SITE_OTHER): Payer: Managed Care, Other (non HMO) | Admitting: Nurse Practitioner

## 2011-02-02 VITALS — BP 138/90 | HR 58 | Ht 68.0 in | Wt 164.0 lb

## 2011-02-02 DIAGNOSIS — I10 Essential (primary) hypertension: Secondary | ICD-10-CM

## 2011-02-02 DIAGNOSIS — I4891 Unspecified atrial fibrillation: Secondary | ICD-10-CM

## 2011-02-02 DIAGNOSIS — G459 Transient cerebral ischemic attack, unspecified: Secondary | ICD-10-CM

## 2011-02-02 NOTE — Assessment & Plan Note (Signed)
Blood pressure is currently satisfactory. He does have a resting bradycardia. If he needs antihypertensives in the future, I would consider other alternatives such as ACE or ARB.   He will continue to monitor at home. He is encouraged to restrict salt and have alcohol in moderation.

## 2011-02-02 NOTE — Progress Notes (Signed)
History of Present Illness: Evan Farmer is seen today for a post hospital visit. He is seen for Dr. Deborah Chalk. He was hospitalized earlier this month with a questionable TIA.  He says he just did not feel right. He may have had some tingling in his left arm. He admits that he panicked but sought care. His CT and MRI showed no acute abnormality. Echo showed normal LV systolic function with grade 2 diastolic dysfunction. He has mild LVH.  His blood pressure was grossly elevated. He was discharged on an increased dose of Atenolol. He did not take the double dose because of his resting bradycardia. He bought himself a blood pressure monitor. His diary from home is reviewed and is shows very good control. In thinking back, he reports that he had probably had too much salt, alcohol and was under more stress.He is now trying to watch his salt and minimize his alcohol.  He feels good now with no complaint.   Current Outpatient Prescriptions on File Prior to Visit  Medication Sig Dispense Refill  . aspirin 81 MG tablet Take 81 mg by mouth daily.        Marland Kitchen atenolol (TENORMIN) 50 MG tablet Take 50 mg by mouth daily.        Marland Kitchen atorvastatin (LIPITOR) 10 MG tablet Take 10 mg by mouth daily. 2-3 TIMES WEEKLY       . flecainide (TAMBOCOR) 100 MG tablet Take 100 mg by mouth 2 (two) times daily.        . tadalafil (CIALIS) 5 MG tablet Take 5 mg by mouth daily as needed.        Marland Kitchen VITAMIN D, ERGOCALCIFEROL, PO Take by mouth. Patient taking one a day. Strength is unknown.         Allergies  Allergen Reactions  . Clonidine Hydrochloride   . Procainamide Hcl     REACTION: sun dermatitis    Past Medical History  Diagnosis Date  . Paroxysmal a-fib   . Weight gain   . HTN (hypertension)   . Hyperlipidemia   . [place] spotted fever     age 60  . Scoliosis   . TIA (transient ischemic attack)     2012    Past Surgical History  Procedure Date  . Prostatectomy   . Nasal septum surgery     History  Smoking  status  . Former Smoker  . Types: Cigarettes  . Quit date: 11/12/1968  Smokeless tobacco  . Not on file    History  Alcohol Use  . Yes    Social alcohol use    Family History  Problem Relation Age of Onset  . Arrhythmia Mother     A-FIB  . Lung cancer Father     Review of Systems: He denies chest pain. He is not short of breath. He is exercising some. His rhythm has been good. He is not lightheaded or dizzy.  All other systems were reviewed and are negative.  Physical Exam: BP 138/90  Pulse 58  Ht 5\' 8"  (1.727 m)  Wt 164 lb (74.39 kg)  BMI 24.94 kg/m2 He is pleasant. He is a little anxious. Skin is warm and dry. Color is normal. HEENT is unremarkable. Lungs are clear. Cardiac exam shows a regular rate and rhythm. Abdomen is soft. He has no edema. Gait and ROM are intact. He has no gross neurologic deficits.   ECG: N/A  Assessment / Plan:

## 2011-02-02 NOTE — Patient Instructions (Signed)
Must monitor your blood pressure daily. Call for consistent readings above 140 systolic See me in 3 months.  We need to check lab and EKG on return.

## 2011-02-02 NOTE — Assessment & Plan Note (Signed)
According to his account, it seems this was mostly triggered by his HTN. He has not had recurrence. MRI and CT were negative. Echo showed no embolus.

## 2011-02-09 NOTE — H&P (Signed)
NAME:  Evan Farmer, Evan Farmer NO.:  [id number]  MEDICAL RECORD NO.:  [id number]           PATIENT TYPE:  E  LOCATION:  WLED                         FACILITY:  [facility]  PHYSICIAN:  Conley Canal, MD      DATE OF BIRTH:  [birth year]  DATE OF ADMISSION:  01/19/2011 DATE OF DISCHARGE:                             HISTORY & PHYSICAL   PRIMARY CARE PHYSICIAN:  Georgina Quint. Plotnikov, MD.  CARDIOLOGIST:  Colleen Can. Deborah Chalk, MD.  CHIEF COMPLAINT:  Numbness of the left arm and left face.  HISTORY OF PRESENT ILLNESS:  This is a pleasant 60 year old male with history of hypertension, atrial fibrillation, status post cardioversion, on aspirin, hyperlipidemia, depression, came in with complaints of numbness and tingling sensation of the left arm for the last few days followed by numbness on the left side of the face.  This was not associated with any chest pain.  No headaches.  No fevers.  When he came to the emergency room, his blood pressure was noted 197/100.  He states that he has been checking his blood pressure three times a week at a local drug store and the blood pressure has not been this high.  CT of the brain was normal and labs so far including cardiac enzymes. Electrolytes have been unremarkable.  PAST MEDICAL HISTORY: 1. Atrial fibrillation. 2. Depression. 3. Hypertension. 4. Hyperlipidemia. 5. Status post prostatectomy.  ALLERGIES:  QUINIDINE, PRONESTYL.  HOME MEDICATIONS:  Atenolol, Tambocor, Zoloft.  SOCIAL HISTORY:  The patient lives with his son.  Denies cigarette smoking.  No illicit drugs.  Occasionally drinks.  FAMILY HISTORY:  Mother had atrial fibrillation, died in her 45s.  REVIEW OF SYSTEMS:  Unremarkable except as highlighted in the history of present illness.  PHYSICAL EXAMINATION:  GENERAL:  On examination, this is a pleasant, middle-aged gentleman, who is not in acute distress. VITAL SIGNS:  His blood pressure is 137/82, heart rate is in the  60s, temperature 98.4, respirations 16, oxygen saturation 97% on room air. HEAD, EYES, EARS, NOSE, AND THROAT:  Pupils are equal and reacting to light.  No jugular venous distention.  No carotid bruits.  No facial symmetry. RESPIRATORY SYSTEM:  Good air entry bilaterally with no rhonchi, rales, or wheezes. CARDIOVASCULAR SYSTEM:  First and second heart sounds heard.  No murmurs.  Pulse regular. ABDOMEN:  Scaphoid, soft, nontender.  No palpable organomegaly.  Bowel sounds are normal. CNS:  The patient is alert and oriented to person, place, and time.  He does not have any acute focal neurological deficits. EXTREMITIES:  No pedal edema.  Peripheral pulses are equal.  LABS:  Discussed above.  EKG shows normal sinus rhythm with no significant ST-T wave changes.  CT of the brain was also normal.  Chest x-ray showed no acute cardiopulmonary disease, some hyperinflated lungs with severe rotatory scoliosis.  IMPRESSION:  Transient ischemic attack in a 60 year old male with history of atrial fibrillation who is status post cardioversion and is currently in normal sinus rhythm.  Transient ischemic attack is most likely related to uncontrolled hypertension.  PLAN: 1. TIA.  We will admit the patient  to telemetry for rule out stroke     workup including MRI, carotid duplex, 2-D echocardiogram, check     lipids panel.  Meanwhile, risk factor modification with     optimization of blood pressure control, statin, aspirin. 2. Atrial fibrillation.  The patient currently in sinus rhythm, rate     controlled.  We will continue atenolol and flecainide. 3. Depression.  Zoloft to adjust dosage once confirmed. 4. DVT, GI prophylaxis. 5. The patient's condition is fair.     Conley Canal, MD     SR/MEDQ  D:  01/20/2011  T:  01/20/2011  Job:  [id number]  cc:   Colleen Can. Deborah Chalk, M.D. Fax: [phone]  Georgina Quint. Plotnikov, MD [address] [place] [address]  Electronically Signed by  Conley Canal  on 02/09/2011 02:08:49 AM

## 2011-02-11 ENCOUNTER — Emergency Department (HOSPITAL_COMMUNITY): Payer: Managed Care, Other (non HMO)

## 2011-02-11 ENCOUNTER — Inpatient Hospital Stay (HOSPITAL_COMMUNITY)
Admission: EM | Admit: 2011-02-11 | Discharge: 2011-02-12 | DRG: 287 | Disposition: A | Discharge status: Home / Self Care | Payer: Managed Care, Other (non HMO) | Attending: Cardiology | Admitting: Cardiology

## 2011-02-11 DIAGNOSIS — F329 Major depressive disorder, single episode, unspecified: Secondary | ICD-10-CM | POA: Diagnosis present

## 2011-02-11 DIAGNOSIS — E785 Hyperlipidemia, unspecified: Secondary | ICD-10-CM | POA: Diagnosis present

## 2011-02-11 DIAGNOSIS — I4891 Unspecified atrial fibrillation: Secondary | ICD-10-CM | POA: Diagnosis present

## 2011-02-11 DIAGNOSIS — F3289 Other specified depressive episodes: Secondary | ICD-10-CM | POA: Diagnosis present

## 2011-02-11 DIAGNOSIS — R079 Chest pain, unspecified: Secondary | ICD-10-CM

## 2011-02-11 DIAGNOSIS — Z9889 Other specified postprocedural states: Secondary | ICD-10-CM

## 2011-02-11 DIAGNOSIS — Z7982 Long term (current) use of aspirin: Secondary | ICD-10-CM

## 2011-02-11 DIAGNOSIS — R0789 Other chest pain: Principal | ICD-10-CM | POA: Diagnosis present

## 2011-02-11 DIAGNOSIS — R0609 Other forms of dyspnea: Secondary | ICD-10-CM

## 2011-02-11 DIAGNOSIS — Z8546 Personal history of malignant neoplasm of prostate: Secondary | ICD-10-CM

## 2011-02-11 DIAGNOSIS — R0989 Other specified symptoms and signs involving the circulatory and respiratory systems: Secondary | ICD-10-CM

## 2011-02-11 DIAGNOSIS — I10 Essential (primary) hypertension: Secondary | ICD-10-CM | POA: Diagnosis present

## 2011-02-11 HISTORY — DX: Other specified postprocedural states: Z98.890

## 2011-02-12 DIAGNOSIS — R079 Chest pain, unspecified: Secondary | ICD-10-CM

## 2011-02-12 LAB — BASIC METABOLIC PANEL
Chloride: 103 mEq/L (ref 96–112)
GFR calc non Af Amer: >60 mL/min (ref >60)
Glucose, Bld: 109 mg/dL — ABNORMAL HIGH (ref 70–99)
Potassium: 3.4 mEq/L — ABNORMAL LOW (ref 3.5–5.1)
Sodium: 138 mEq/L (ref 135–145)

## 2011-02-12 LAB — COMPREHENSIVE METABOLIC PANEL
AST: 16 U/L (ref 0–37)
Albumin: 3.6 g/dL (ref 3.5–5.2)
BUN: 12 mg/dL (ref 6–23)
Calcium: 9.1 mg/dL (ref 8.4–10.5)
Creatinine, Ser: 0.98 mg/dL (ref 0.4–1.5)
GFR calc Af Amer: >60 mL/min (ref >60)
Total Bilirubin: 0.8 mg/dL (ref 0.3–1.2)
Total Protein: 6 g/dL (ref 6.0–8.3)

## 2011-02-12 LAB — CK TOTAL AND CKMB (NOT AT ARMC): CK, MB: 0.6 ng/mL (ref 0.3–4.0)

## 2011-02-12 LAB — CARDIAC PANEL(CRET KIN+CKTOT+MB+TROPI)
CK, MB: 0.5 ng/mL (ref 0.3–4.0)
Relative Index: INVALID (ref 0.0–2.5)
Total CK: 46 U/L (ref 7–232)
Troponin I: 0.01 ng/mL (ref 0.00–0.06)

## 2011-02-12 LAB — CBC
HCT: 44 % (ref 39.0–52.0)
Hemoglobin: 15.7 g/dL (ref 13.0–17.0)
RBC: 4.7 MIL/uL (ref 4.22–5.81)
WBC: 4.8 10*3/uL (ref 4.0–10.5)

## 2011-02-12 LAB — POCT CARDIAC MARKERS
CKMB, poc: <1 ng/mL — ABNORMAL LOW (ref 1.0–8.0)
Myoglobin, poc: 41.2 ng/mL (ref 12–200)
Troponin i, poc: <0.05 ng/mL (ref 0.00–0.09)

## 2011-02-12 LAB — D-DIMER, QUANTITATIVE: D-Dimer, Quant: <0.22 ug/mL-FEU (ref 0.00–0.48)

## 2011-02-12 LAB — PROTIME-INR
INR: 0.95 (ref 0.00–1.49)
Prothrombin Time: 12.9 seconds (ref 11.6–15.2)

## 2011-02-19 ENCOUNTER — Telehealth: Payer: Self-pay | Admitting: Cardiovascular Disease

## 2011-02-19 NOTE — Telephone Encounter (Signed)
Spoke w/pt he states there is a dark spot at cath site and greenish color around it like a bruise, advised bruising is normal, he states no knot no oozing or drainage, he just wanted to make sure it was ok advised ok cont. To monitor and let us know if dev a knot or drainage

## 2011-02-26 LAB — BASIC METABOLIC PANEL
CO2: 29 mEq/L (ref 19–32)
Calcium: 9 mg/dL (ref 8.4–10.5)
Chloride: 106 mEq/L (ref 96–112)
Creatinine, Ser: 1 mg/dL (ref 0.4–1.5)
GFR calc Af Amer: >60 mL/min (ref >60)
Sodium: 140 mEq/L (ref 135–145)

## 2011-02-26 LAB — CBC
HCT: 31.3 % — ABNORMAL LOW (ref 39.0–52.0)
Hemoglobin: 16.9 g/dL (ref 13.0–17.0)
MCV: 98.3 fL (ref 78.0–100.0)
MCV: 98.4 fL (ref 78.0–100.0)
Platelets: 117 10*3/uL — ABNORMAL LOW (ref 150–400)
RBC: 5.08 MIL/uL (ref 4.22–5.81)
RDW: 12.5 % (ref 11.5–15.5)
WBC: 3.7 10*3/uL — ABNORMAL LOW (ref 4.0–10.5)

## 2011-02-26 LAB — ABO/RH: ABO/RH(D): A NEG

## 2011-02-26 LAB — TYPE AND SCREEN
ABO/RH(D): A NEG
Antibody Screen: NEGATIVE

## 2011-02-26 LAB — HEMOGLOBIN AND HEMATOCRIT, BLOOD
HCT: 40.6 % (ref 39.0–52.0)
Hemoglobin: 11.5 g/dL — ABNORMAL LOW (ref 13.0–17.0)
Hemoglobin: 13.7 g/dL (ref 13.0–17.0)

## 2011-02-28 ENCOUNTER — Encounter: Payer: Self-pay | Admitting: Nurse Practitioner

## 2011-02-28 NOTE — H&P (Signed)
NAME:  Evan Farmer, Evan Farmer NO.:  [id number]  MEDICAL RECORD NO.:  [id number]           PATIENT TYPE:  E  LOCATION:  [facility]                         FACILITY:  [facility]  PHYSICIAN:  Georga Hacking, M.D.DATE OF BIRTH:  10/20/1951  DATE OF ADMISSION:  02/12/2011                              HISTORY & PHYSICAL   This 60 year old male was admitted to rule out myocardial infarction. He has a history of hypertensive heart disease and had atrial fibrillation with a previous cardioversion in March of 2008.  He has largely maintained sinus rhythm since then he has a history of significant depression as well as hyperlipidemia.  He was admitted about 3 weeks ago with numbness and tingling in the left arm for past few days followed by numbness in the left side of his face.  His blood pressure was high and the echocardiogram showed concentric LVH with normal systolic function.  MRI was unremarkable and he was discharged to followup with Dr. Deborah Chalk.  He had the onset of dyspnea with exertion and dyspnea that did not feel well, they were worried that he might be out of rhythm and then began to have vague substernal tightness.  The tightness occurred at rest and was described as a tight feeling in the center part of his chest, it is not pleuritic and not associated with diaphoresis or with sweating.  He did not have radiation to the arm or up into the neck.  He came to the emergency room and initial point-of- care enzymes was negative, but he had new T-wave inversions in V3 and V4.  He is admitted at this time to rule out myocardial infarction for further therapy.  PAST HISTORY:  Remarkable for atrial fibrillation, depression, hypertension, hyperlipidemia.  He has a previous diagnosis of prostate cancer and has had previous prostatectomy.  PREVIOUS SURGERIES:  Hernia repair x2, prostatectomy for prostate cancer 2 years ago.  ALLERGIES:  To QUINIDINE and PROCAINAMIDE.  CURRENT  MEDICATIONS: 1. Atenolol 50 mg daily. 2. Flecainide 100 mg twice a day. 3. Zoloft 100 mg daily. 4. Vitamin B12 nasal spray. 5. Protonix 40 mg daily. 6. Lipitor 10 mg nightly. 7. Aspirin 325 mg daily.  FAMILY HISTORY:  Positive for atrial fibrillation.  Negative for premature cardiac disease.  SOCIAL HISTORY:  He lives with his son.  He does not use alcohol to excess, but does drink socially.  He has no significant tobacco use.  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  REVIEW OF SYSTEMS:  He has been hypertensive for years.  He has a history of anxiety, situational depression.  He has had some recent numbness of his arm and his face, but this has not recurred.  He does not normally have exercise intolerance.  He has no GI problems.  No diarrhea, constipation, or hematochezia.  He does have some bladder urgency.  Does have impotence since his prostatectomy.  No significant arthritis other than as noted above.  The remainder of review of systems is unremarkable.  PHYSICAL EXAMINATION:  GENERAL:  He is a pleasant male appearing  stated age in no acute distress. VITAL SIGNS:  Blood pressure is currently 137/80, pulse is currently 50 and regular. SKIN:  Warm and dry. HEENT:  EOMI.  PERRLA.  Pharynx negative. CNS:  Clear.  Funduscopic exam unremarkable. NECK:  Supple without masses, JVD, thyromegaly, or bruits. LUNGS:  Clear to A and P. CARDIAC:  Normal S1 and S2.  No S3, S4, or murmur. ABDOMEN:  Soft and nontender. EXTREMITIES:  Femoral pulses are 2+ without bruits.  His pulses are 2+. There is no edema.  No Denna Haggard' sign noted. NEUROLOGIC:  Shows normal cranial nerves.  Sensory motor intact.  No cerebellar deficits.  Chest x-ray was unremarkable.  EKG shows an IV conduction delay.  There is no T-wave inversions in V3 and V4.  His potassium is 3.2 on admission, otherwise lab is normal with normal BUN and creatinine. Glucose is  100.  IMPRESSION: 1. Chest pain and dyspnea consistent with unstable angina factors, new     T-wave changes in V3 and V4. 2. Atrial fibrillation with previous cardioversion in 2008, currently     maintaining sinus rhythm. 3. Hypertensive heart disease. 4. History of prostate cancer with previous prostatectomy. 5. Hyperlipidemia, under treatment. 6. Anxiety, situational stress. 7. Hypokalemia.  RECOMMENDATIONS:  He will be placed on IV heparin.  We will continue his medicines and check serial cardiac enzymes, keep him p.o. for possible catheterization, replete potassium, and recheck.     Georga Hacking, M.D.     WST/MEDQ  D:  02/12/2011  T:  02/12/2011  Job:  [id number]  cc:   Colleen Can. Deborah Chalk, M.D. Georgina Quint. Plotnikov, MD  Electronically Signed by Lacretia Nicks. Donnie Aho M.D. on 02/28/2011 02:05:20 PM

## 2011-03-05 ENCOUNTER — Ambulatory Visit: Payer: Managed Care, Other (non HMO) | Admitting: Nurse Practitioner

## 2011-03-07 NOTE — Discharge Summary (Signed)
  NAME:  Evan Farmer, Evan Farmer NO.:  [id number]  MEDICAL RECORD NO.:  [id number]           PATIENT TYPE:  I  LOCATION:  6522                         FACILITY:  [facility]  PHYSICIAN:  Veverly Fells. Excell Seltzer, MD  DATE OF BIRTH:  02-26-1951  DATE OF ADMISSION:  02/11/2011 DATE OF DISCHARGE:  02/12/2011                              DISCHARGE SUMMARY   PROCEDURES: 1. Cardiac catheterization. 2. Coronary arteriogram. 3. Left ventriculogram. 4. Portable chest x-ray.  PRIMARY FINAL DISCHARGE DIAGNOSES: 1. Chest pain, coronary arteries without disease and ejection fraction     normal.  Secondary Diagnoses: 1. Paroxysmal atrial fibrillation, status post cardioversion in 2008. 2. Depression. 3. Hypertension. 4. Hyperlipidemia. 5. Possible transient ischemic attack, discharged on January 20, 2011. 6. Allergy or intolerance to QUINIDINE and PRONESTYL. 7. History of prostate cancer, status post laparoscopic radical    prostatectomy in 2010. 8. History of mild left ventricular hypertrophy with an ejection     fraction of 65% and grade 2 diastolic dysfunction by echocardiogram     on January 20, 2011.  TIME AT DISCHARGE:  32 minutes.  HOSPITAL COURSE:  Evan Farmer is a 60 year old male with a history of PAF, but no history of coronary artery disease.  He had chest pain and came to the hospital where he was admitted for further evaluation.  His cardiac enzymes were negative for MI.  It was felt that the best option was to do a heart catheterization and further define his anatomy. The heart catheterization showed no obstructive disease and normal left ventricular function.  On his labs, his platelets were slightly low at 145 and his potassium was low at 3.2, but this was supplemented.  On February 12, 2011 p.m., Evan Farmer was ambulating without chest pain or shortness of breath.  He is considered stable for discharge, to follow up as an outpatient.  DISCHARGE INSTRUCTIONS: 1. His activity  level is to be increased gradually. 2. He is to call our office for problems with the cath site. 3. He is encouraged to stick to a low-sodium, heart-healthy diet. 4. He is to follow up with Dr. Deborah Chalk on March 04, 2101 at 11:00 a.m.     and with Dr. Posey Rea as needed.  DISCHARGE MEDICATIONS: 1. Flecainide 100 mg p.o. b.i.d. 2. Zoloft 100 mg a day. 3. Atenolol 50 mg a day. 4. Lipitor 10 mg a day. 5. Aspirin 325 mg daily. 6. Nascobal nasal spray weekly. 7. Levitra daily p.r.n. 8. Vitamin D OTC daily.     Theodore Demark, PA-C   ______________________________ Veverly Fells. Excell Seltzer, MD    RB/MEDQ  D:  02/12/2011  T:  02/13/2011  Job:  [id number]  cc:   Georgina Quint. Plotnikov, MD  Electronically Signed by Theodore Demark PA-C on 02/14/2011 01:36:30 PM Electronically Signed by Tonny Bollman MD on 03/07/2011 10:26:41 AM

## 2011-03-27 NOTE — Op Note (Signed)
NAME:  Evan, Farmer NO.:  [id number]   MEDICAL RECORD NO.:  [id number]          PATIENT TYPE:  INP   LOCATION:  0003                         FACILITY:  [facility]   PHYSICIAN:  Heloise Purpura, MD      DATE OF BIRTH:  03-May-1951   DATE OF PROCEDURE:  11/18/2008  DATE OF DISCHARGE:                               OPERATIVE REPORT   PREOPERATIVE DIAGNOSIS:  Clinically localized adenocarcinoma of prostate  (clinical stage T1C NX MX).   POSTOPERATIVE DIAGNOSIS:  Clinically localized adenocarcinoma of  prostate (clinical stage T1C NX MX).   PROCEDURE:  Robotic assisted laparoscopic radical prostatectomy  (bilateral nerve sparing).   SURGEON:  Dr. Heloise Purpura.   ASSISTANT:  Delia Chimes, nurse practitioner.   ANESTHESIA:  General.   COMPLICATIONS:  None.   ESTIMATED BLOOD LOSS:  75 mL.   SPECIMENS:  Prostate and seminal vesicles specimens to pathology.   DRAINS:  1. A 20-French coude catheter.  2. A #19 Blake pelvic drain.   INDICATION:  Evan Farmer is a 60 year old gentleman with clinically  localized adenocarcinoma of the prostate.  After discussion regarding  management options for treatment, he elected to proceed with surgical  therapy and the above procedure.  Potential risks, complications, and  alternative options were discussed in detail with the patient and  informed consent was obtained.   DESCRIPTION OF PROCEDURE:  The patient was taken to the operating room  and a general anesthetic was administered.  He was given preoperative  antibiotics, placed in the dorsal lithotomy position, and prepped and  draped in the usual sterile fashion.  Next a preoperative time-out was  performed.  A Foley catheter was then inserted into the bladder and a  site was selected just superior to the umbilicus for placement of the  camera port.  This was placed using a standard open Hasson technique.  This allowed entry into the peritoneal cavity under direct vision  and  without difficulty.  A 12 mm port was then placed and a pneumoperitoneum  established.  The 0 degree lens was used to inspect the abdomen and  there was no evidence for any intra-abdominal injuries or other  abnormalities.  The remaining ports were then placed.  Bilateral 8 mm  robotic ports were placed 10 cm lateral to and just inferior to the  camera port site.  An additional 8 mm robotic port was placed in the far  left lateral abdominal wall.  A 5 mm port was placed between the camera  port and the right robotic port and an additional 12 mm port was placed  in the far right lateral abdominal wall for laparoscopic assistance.  All ports were placed under direct vision without difficulty.  The  surgical cart was then docked.  With the aid of the cautery scissors,  the bladder was reflected posteriorly allowing entry into the space of  Retzius and identification of the endopelvic fascia and prostate.  The  endopelvic fascia was then incised from the apex back to the base of  prostate bilaterally and the underlying levator muscle fibers were  swept  laterally off the prostate thereby isolating the dorsal venous complex.  The dorsal venous complex was then stapled and divided with a 45 mm flex  ETS stapler.  The bladder neck was then identified with the aid of Foley  catheter manipulation and divided anteriorly thereby exposing the Foley  catheter.  The catheter balloon was deflated and the catheter was  brought into the operative field and used to retract the prostate  anteriorly.  On inspection of the posterior bladder neck, there was  noted to be a small median lobe which was excised.  Dissection then  proceeded posteriorly between the bladder and the prostate until the  vasa deferentia and seminal vesicles were identified.  The vasa  deferentia were isolated, divided and lifted anteriorly.  The seminal  vesicles were then dissected down to their tips with care to control the   seminal vesicle arterial blood supply.  The seminal vesicles were then  lifted anteriorly and the space between Denonvilliers fascia and the  anterior rectum was bluntly developed thereby isolating the vascular  pedicles of the prostate.  The vascular pedicles of the prostate was  then ligated with Hem-o-lok clips after the lateral prostatic fascia had  been incised thereby releasing the neurovascular bundles.  The vascular  pedicles of prostate were then divided with sharp cold scissor  dissection and the neurovascular bundles were swept off the apex of the  prostate and urethra.  The urethra was sharply transected allowing the  prostate specimen to be disarticulated.  The pelvis was then copiously  irrigated and hemostasis was ensured.  The attention then turned to the  urethral anastomosis.  A 2-0 Vicryl slip-knot was placed between  Denonvilliers fascia, the posterior bladder neck and the posterior  urethra to reapproximate these structures.  A double-armed 3-0 Monocryl  suture was then used to perform a 360 degree running tension-free  anastomosis between the bladder neck and urethra.  A new 20-French coude  catheter was inserted into the bladder and irrigated.  The anastomosis  appeared to be watertight and there were no blood clots within the  bladder.  At this point, there was noted to be some venous oozing from  the neurovascular bundles bilaterally.  A small piece of Surgicel was  therefore placed over these venous bleeding sites and hemostasis  appeared to be excellent even with the pneumoperitoneum brought down.  A  #19 Blake drain was brought through the left robotic port and  appropriately positioned in the pelvis.  It was secured to the skin with  a nylon suture.  The surgical cart was then undocked.  The right lateral  12 mm port site was closed with a 0 Vicryl suture placed with aid of the  suture passer device.  All remaining ports were removed under direct  vision.  The  prostate specimen was then removed intact within the  Endopouch retrieval bag via the periumbilical port site.  This fascial  opening was then closed with a running 0 Vicryl suture.  All port sites  were then injected with 0.25% Marcaine and reapproximated at the skin  level with staples.  Sterile dressings were applied.  The patient  appeared to tolerate the procedure well without complications.  He was  able to be extubated and transferred to the recovery unit in  satisfactory condition.      Heloise Purpura, MD  Electronically Signed     LB/MEDQ  D:  11/18/2008  T:  11/18/2008  Job:  [id number] 

## 2011-03-30 NOTE — Op Note (Signed)
NAME:  Evan Farmer, Evan Farmer                          ACCOUNT NO.:  [id number]   MEDICAL RECORD NO.:  [id number]                   PATIENT TYPE:  AMB   LOCATION:  DAY                                  FACILITY:  [facility]   PHYSICIAN:  Timothy E. Earlene Plater, M.D.              DATE OF BIRTH:  May 27, 1951   DATE OF PROCEDURE:  05/04/2004  DATE OF DISCHARGE:                                 OPERATIVE REPORT   PREOPERATIVE DIAGNOSES:  Left inguinal hernia.   POSTOPERATIVE DIAGNOSES:  Left direct and indirect inguinal hernia.   PROCEDURE:  Repair of hernia with mesh.   SURGEON:  Timothy E. Earlene Plater, M.D.   ANESTHESIA:  General.   Evan Farmer is 42 in excellent health, cardiovascular disease uncontrolled,  prior right inguinal hernia now has a symptomatic left inguinal hernia.  After careful discussion, he wishes to proceed.  His laboratory data are  reviewed, he is identified and the correct site marked and identified and  permit signed.   The patient was taken to the operating room, general endotracheal anesthesia  administered.  The entire lower abdomen was prepped and draped in the usual  fashion.  I did check the right side as he had been having some symptoms, I  do not detect a hernia. The left side had an obvious defect.  The left side  was approached by first anesthetizing the skin, subcu and fascia with 0.25%  Marcaine with epinephrine. A slanting horizontal incision made over the  palpable defect, the scant subcutaneous tissue dissected. The external  oblique opened carefully in line with its fibers to the external ring.  The  ilioinguinal nerve protected and reflected medially. These cord tissues were  dissected from the pubic tubercle and dissected from the floor of the canal  which showed a visible and palpable direct defect. Likewise the cord was  thickened at the internal ring and a small lipoma and small indirect sac  were dissected from the cord structures, ligated at the neck at the  internal  ring with 2-0 Vicryl suture.  One suture was placed across the lower border  of the internal ring of #0 Prolene and then a flat piece of mesh was cut and  fashioned to fit the floor and sutured down with 2-0 and #0 Prolene suture  up to and around the internal ring and the cord and the cord exited normally  with adequate room.  The external oblique was closed with running 2-0  Vicryl, deep subcu 2-0 Vicryl and skin with 3-0 Monocryl.  Counts correct.  Steri-Strips and dry sterile dressing applied. He tolerated it well and was  awakened and taken to the recovery room in good condition.   Written and verbal instructions were given to the patient and his family and  he will be seen and followed as an outpatient.  Timothy E. Earlene Plater, M.D.    TED/MEDQ  D:  05/04/2004  T:  05/04/2004  Job:  [id number]   cc:   L. Lupe Carney, M.D.  [address] [place]  [place] [address]  Fax: [phone]   Colleen Can. Deborah Chalk, M.D.  Fax: [phone]

## 2011-03-30 NOTE — H&P (Signed)
NAME:  Evan Farmer, Evan Farmer NO.:  [id number]   MEDICAL RECORD NO.:  [id number]          PATIENT TYPE:  EMS   LOCATION:  [facility]JO                         FACILITY:  [facility]   PHYSICIAN:  Colleen Can. Deborah Chalk, M.D.DATE OF BIRTH:  Apr 06, 1951   DATE OF ADMISSION:  01/14/2007  DATE OF DISCHARGE:                              HISTORY & PHYSICAL   ADMISSION HISTORY AND PHYSICAL   CHIEF COMPLAINT:  Elevated heart rate.   HISTORY OF PRESENT ILLNESS:  Evan Farmer is a 60 year old white male who  has a known history of paroxysmal atrial fibrillation who presents to  the office as a work-in appointment this afternoon with elevation in his  heart rate that started last night.  It lasted for a few minutes and all  day today.  He really felt out of sorts.  He saw the nurse at work,  who noted his heart rate was elevated and he was referred here to the  office.  He has had no chest pain or shortness of breath.  He has had  very minimal lightheadedness and dizziness with no frank syncope.  He  has missed a couple of doses of Flecainide and has been under more  stress.  He was subsequently seen in the office.  He was treated with  one dose of IV Cardizem with some slowing.  He was noted to be in atrial  fibrillation.  He is now admitted for further medical management.   PAST MEDICAL HISTORY:  1. History of atrial fibrillation.  He has been maintained on      Flecainide but has had some noncompliance.  He has had a negative      EP study demonstrating no pre-excitation.  2. Hyperlipidemia.  3. Borderline hypertension.  4. Situational stress.  5. Depression.  6. Mild LVH.  7. Chronic anti-arrhythmic therapy.  8. History of hernia repair in 2005.   ALLERGIES:  PRONESTYL WHICH CAUSED A RASH AND QUINIDINE WHICH CAUSED  DRUG FEVER.   CURRENT MEDICATIONS:  Include:  Flecainide 100 mg b.i.d.  Baby aspirin daily.  Tenormin 50 mg a day.  Lipitor 10 mg taken occasionally.  Zoloft 1 a day  started recently.   FAMILY HISTORY:  Noncontributory.   SOCIAL HISTORY:  He works in the [employer].  He is divorced.  He has  no smoking or alcohol history.   REVIEW OF SYSTEMS:  Is basically as noted above.  He does admit he has  been under more stress just in general.  He has had no recent fever, flu  or cough.  No chest pain or shortness of breath.   PHYSICAL EXAMINATION:  On exam he is currently in no acute distress.  Weight is 156 pounds.  Blood pressure is 140/98 sitting.  Heart rate is  initially up into the 150s and irregular.  Respirations 18.  He is  afebrile.  Skin is warm and dry.  Color is unremarkable.  Heart shows an  irregular and tachycardic rhythm.  Abdomen is soft.  Extremities are  without edema.  Neurologic shows no gross focal deficits.  EKG shows atrial fibrillation with RVR.  Other labs are pending.   OVERALL IMPRESSION:  1. Recurrent atrial fibrillation with rapid ventricular response.  2. History of chronic anti-arrhythmic therapy.  3. Hyperlipidemia.  4. Situational stress.   PLAN:  He was treated with one dose of Cardizem here in the office with  some slowing, however it did not maintain.  He is subsequently admitted  for further evaluation and will be started on heparin as well as  continuous Cardizem drip.  It may be that we will proceed on with  cardioversion if he does convert spontaneously.  The further treatment  plan to follow per Dr. Ronnald Nian discretion.      Sharlee Blew, N.P.      Colleen Can. Deborah Chalk, M.D.  Electronically Signed    LC/MEDQ  D:  01/14/2007  T:  01/14/2007  Job:  [id number]

## 2011-03-30 NOTE — Discharge Summary (Signed)
NAME:  Evan Farmer, Evan Farmer NO.:  [id number]   MEDICAL RECORD NO.:  [id number]          PATIENT TYPE:  INP   LOCATION:  6529                         FACILITY:  [facility]   PHYSICIAN:  Colleen Can. Deborah Chalk, M.D.DATE OF BIRTH:  01-29-1951   DATE OF ADMISSION:  01/14/2007  DATE OF DISCHARGE:  01/15/2007                               DISCHARGE SUMMARY   PRIMARY DISCHARGE DIAGNOSES:  1. Paroxysmal atrial fibrillation with subsequent return to normal      sinus rhythm following cardioversion.  2. Antiarrhythmic therapy.  3. Hyperlipidemia.  4. Borderline hypertension.  5. Situational stress.  6. History of depression.  7. Known mild left ventricular hypertrophy.   HISTORY OF PRESENT ILLNESS:  The patient is a 60 year old white male who  has had a known history of paroxysmal atrial fibrillation.  He has been  on and off of his flecainide periodically.  He presented to the office  as a work-in appointment on the afternoon of admission with elevation in  his heart rate that had started the previous evening.  At that time, it  had lasted for a few minutes and he felt somewhat out of sorts.  He went  on the work the next morning where the nurse noted that his heart rate  was elevated.  He was subsequently referred to the office.  He was  treated with IV Cardizem and was admitted for further medical  management.   Please see dictated history and physical for further patient  presentation and profile.   LABORATORY DATA ON ADMISSION:  His EKG showed atrial fibrillation  initially with rapid ventricular response.   His chemistries showed a sodium 142, potassium 3.9, chloride 110, CO2  26, BUN 21, creatinine 1.  CBC was normal with hemoglobin 17, hematocrit  51, white count was 9 and platelets of 201.  PT and PTT were  unremarkable.  CK total was 166 with an MB of 5.1.  Troponin was 0.22.  Urinalysis was negative except for a small amount of bilirubin. His  flecainide level was  0.22.   Chest x-ray showed no active lung disease.  There was severe  thoracolumbar scoliosis.   HOSPITAL COURSE:  The patient was admitted electively.  He reported that  he had missed a few doses of flecainide which has been somewhat of a  chronic issue.  He was placed on IV heparin.  His rate was controlled  with IV Cardizem.  We proceeded on with cardioversion the following  afternoon and after having induction of anesthesia using 110 mg of IV  Pentothal 150 watt-seconds of biphasic energy was delivered in the  synchronize manner which converted the patient to normal sinus rhythm.  Postprocedure, the patient was watched for several hours and then was  found be a satisfactory candidate for discharge.   DISCHARGE CONDITION:  Stable.   DISCHARGE MEDICINES:  1. Flecainide 100 b.i.d.  2. Baby aspirin daily.  3. Tenormin 50 mg a day.  4. Lipitor 10 mg a day.  5. Zoloft as he was recently started on.   We will plan on seeing him  back in the office in 1 week.  He was  encouraged to stay compliant with his medicines.  He is to call if any  problems arise in the interim.      Sharlee Blew, N.P.      Colleen Can. Deborah Chalk, M.D.  Electronically Signed    LC/MEDQ  D:  01/17/2007  T:  01/17/2007  Job:  [id number]

## 2011-03-30 NOTE — Op Note (Signed)
NAME:  Evan Farmer, Evan Farmer                ACCOUNT NO.:  [id number]   MEDICAL RECORD NO.:  [id number]          PATIENT TYPE:  AMB   LOCATION:  [facility]                         FACILITY:  [facility]   PHYSICIAN:  Timothy E. Earlene Plater, M.D. DATE OF BIRTH:  13-May-1951   DATE OF PROCEDURE:  11/21/2006  DATE OF DISCHARGE:                               OPERATIVE REPORT   PREOPERATIVE DIAGNOSIS:  Lipoma.   POSTOPERATIVE DIAGNOSIS:  Lipoma.   PROCEDURE:  Excision of lipoma.   SURGEON:  Timothy E. Earlene Plater, M.D.   ANESTHESIA:  Sedation with local.   Mr. Grealish is an otherwise healthy 60 year old and has an enlarging  lipoma, right upper back, some interference in both leisure time and  work, and wishes to have it removed.  He was seen and identified, the  area marked.   He was taken to the operating room, proper monitors were applied and IV  started. He was turned towards his left so that the right shoulder was  exposed.  IV sedation was given.  The area was clipped, prepped and  draped in the usual fashion.  The lipoma palpated 7 x 5 cm.  The area  was anesthetized with 0.5% Marcaine with epinephrine and a shorter  incision made.  The lipoma was well encapsulated. Gentle blunt and sharp  dissection released the lipoma and it was delivered out of the wound.  It was submitted to pathology.  The wound was carefully scrutinized,  evaluated, irrigated, and cautery was used until dry.  The wound was  closed in layers with Vicryl and Monocryl.  Steri-Strips for the skin.  Counts were correct.  The patient tolerated it well and was removed to  recovery room.   Written and verbal instructions given including Vicodin for pain.  He  will be seen in follow up as an outpatient.      Timothy E. Earlene Plater, M.D.  Electronically Signed     TED/MEDQ  D:  11/21/2006  T:  11/21/2006  Job:  [id number]   cc:   L. Lupe Carney, M.D.  Fax: [phone]

## 2011-03-30 NOTE — Cardiovascular Report (Signed)
NAME:  Evan Farmer, Evan Farmer NO.:  [id number]   MEDICAL RECORD NO.:  [id number]          PATIENT TYPE:  INP   LOCATION:  6529                         FACILITY:  [facility]   PHYSICIAN:  Colleen Can. Deborah Chalk, M.D.DATE OF BIRTH:  04/29/1951   DATE OF PROCEDURE:  01/15/2007  DATE OF DISCHARGE:  01/15/2007                            CARDIAC CATHETERIZATION   PROCEDURE:  Cardioversion.   INDICATIONS FOR PROCEDURE:  Atrial fibrillation.   ANESTHESIA:  Provided by Dr. Diamantina Monks using 110 mg of IV Pentothal.   Using anterior/posterior paddles, 150 watt seconds of biphasic energy  was delivered in a synchronized manner converting the atrial  fibrillation to a normal sinus rhythm.  The patient tolerated well.      Colleen Can. Deborah Chalk, M.D.  Electronically Signed     SNT/MEDQ  D:  01/15/2007  T:  01/15/2007  Job:  [id number]

## 2011-04-12 ENCOUNTER — Other Ambulatory Visit: Payer: Self-pay | Admitting: Internal Medicine

## 2011-04-13 NOTE — Telephone Encounter (Signed)
Ok to RF? 

## 2011-04-14 ENCOUNTER — Telehealth: Payer: Self-pay | Admitting: Physician Assistant

## 2011-04-14 NOTE — Telephone Encounter (Signed)
Pt called this evening to report that his blood pressure is running high. Earlier it was 200/93, most recent BP was 173/86 with heart rate upper 60's-70s. He was wondering if he could take an extra atenolol tonight (he is on flecainide and atenolol 50mg ) - I advised him that this was okay, and to keep a close eye on his blood pressure over the next day or so. If he is still finding that it's markedly elevated, I told him to let Dr. Ronnald Nian office know as he may require an additional agent. I also advised him to go to ER if he develops symptoms related to high BP including blurry vision, headache, weakness or CP/SOB. He expressed understanding and gratitude.

## 2011-04-16 NOTE — Telephone Encounter (Signed)
Error - encounter was opened accidentally.

## 2011-04-18 ENCOUNTER — Telehealth: Payer: Self-pay | Admitting: Cardiology

## 2011-04-18 NOTE — Telephone Encounter (Signed)
Pt said bp has been high over last few days and wants to know if he should come in

## 2011-04-18 NOTE — Telephone Encounter (Signed)
Pt called c/o elevated BP over the last few days.  BP 140-150's/ 80's.  Pt does not feel like he is in a.fib.  But, pt has noticed his heart pounding some.  Pt would like to be evaluated by Dr. Deborah Chalk before he leaves.  RN set pt up to see Dr. Deborah Chalk on 04/20/11 at 10 am.

## 2011-04-19 ENCOUNTER — Other Ambulatory Visit: Payer: Self-pay | Admitting: *Deleted

## 2011-04-19 DIAGNOSIS — Z79899 Other long term (current) drug therapy: Secondary | ICD-10-CM

## 2011-04-20 ENCOUNTER — Ambulatory Visit (INDEPENDENT_AMBULATORY_CARE_PROVIDER_SITE_OTHER): Payer: Managed Care, Other (non HMO) | Admitting: Cardiology

## 2011-04-20 ENCOUNTER — Other Ambulatory Visit (INDEPENDENT_AMBULATORY_CARE_PROVIDER_SITE_OTHER): Payer: Managed Care, Other (non HMO) | Admitting: *Deleted

## 2011-04-20 ENCOUNTER — Encounter: Payer: Self-pay | Admitting: Cardiology

## 2011-04-20 DIAGNOSIS — Z79899 Other long term (current) drug therapy: Secondary | ICD-10-CM

## 2011-04-20 DIAGNOSIS — I10 Essential (primary) hypertension: Secondary | ICD-10-CM

## 2011-04-20 DIAGNOSIS — I4891 Unspecified atrial fibrillation: Secondary | ICD-10-CM

## 2011-04-20 LAB — BASIC METABOLIC PANEL
BUN: 18 mg/dL (ref 6–23)
Calcium: 8.9 mg/dL (ref 8.4–10.5)
Creatinine, Ser: 1.1 mg/dL (ref 0.4–1.5)
GFR: 73.76 mL/min (ref >60.00)
Glucose, Bld: 110 mg/dL — ABNORMAL HIGH (ref 70–99)

## 2011-04-20 LAB — HEPATIC FUNCTION PANEL
AST: 16 U/L (ref 0–37)
Albumin: 4.2 g/dL (ref 3.5–5.2)

## 2011-04-20 NOTE — Progress Notes (Signed)
Subjective:   Evan Farmer comes in today for followup visit. He had remote history of paroxysmal atrial fibrillation and at one time, had a dull away with preexcitation that has disappeared since his been on flecainide therapy. Overall, he continues to do well has not had any recurrent atrial fibrillation. 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 He is gained approximately 10 pounds since retiring. He does have a history of hypertension as been well-controlled. His anticoagulation his with aspirin.  Current Outpatient Prescriptions  Medication Sig Dispense Refill  . aspirin 81 MG tablet Take 81 mg by mouth daily.        Marland Kitchen atenolol (TENORMIN) 50 MG tablet Take 50 mg by mouth daily.        Marland Kitchen atorvastatin (LIPITOR) 10 MG tablet Take 10 mg by mouth daily. 3-4 TIMES WEEKLY      . flecainide (TAMBOCOR) 100 MG tablet Take 100 mg by mouth 2 (two) times daily.        Marland Kitchen NASCOBAL 500 MCG/0.1ML SOLN Inhale 1 spray into the lungs Once a week.      . sertraline (ZOLOFT) 100 MG tablet Take 100 mg by mouth daily.        . vardenafil (LEVITRA) 20 MG tablet Take 20 mg by mouth daily as needed.        Marland Kitchen VITAMIN D, ERGOCALCIFEROL, PO Take by mouth. Patient taking one a day. Strength is unknown.       Marland Kitchen DISCONTD: tadalafil (CIALIS) 5 MG tablet Take 5 mg by mouth daily as needed.          Allergies  Allergen Reactions  . Clonidine Hydrochloride   . Procainamide Hcl     REACTION: sun dermatitis    Patient Active Problem List  Diagnoses  . DIARRHEA OF PRESUMED INFECTIOUS ORIGIN  . B12 DEFICIENCY  . VITAMIN D DEFICIENCY  . HYPERLIPIDEMIA  . ANXIETY  . DEPRESSION  . ATRIAL FIBRILLATION  . BACTERIAL PNEUMONIA  . PROSTATITIS, RECURRENT  . ERECTILE DYSFUNCTION  . RUPTURE, QUADRICEPS TENDON  . HOARSENESS  . PALPITATIONS  . ELEVATED PROSTATE SPECIFIC ANTIGEN  . CBC, ABNORMAL  . PROSTATE CANCER, HX OF  . Paroxysmal a-fib  . Weight gain  . HTN (hypertension)  . TIA (transient ischemic  attack)    History  Smoking status  . Never Smoker   Smokeless tobacco  . Never Used    History  Alcohol Use  . Yes    Social alcohol use    Family History  Problem Relation Age of Onset  . Arrhythmia Mother     A-FIB  . Lung cancer Father     Review of Systems:   The patient denies any heat or cold intolerance.  No weight gain or weight loss.  The patient denies headaches or blurry vision.  There is no cough or sputum production.  The patient denies dizziness.  There is no hematuria or hematochezia.  The patient denies any muscle aches or arthritis.  The patient denies any rash.  The patient denies frequent falling or instability.  There is no history of depression or anxiety.  All other systems were reviewed and are negative.   Physical Exam:   He has scoliosis. Blood pressure 134 bradycardia. Heart rate 68. Weights 166.The head is normocephalic and atraumatic.  Pupils are equally round and reactive to light.  Sclerae nonicteric.  Conjunctiva is clear.  Oropharynx is unremarkable.  There's adequate oral airway.  Neck is supple  there are no masses.  Thyroid is not enlarged.  There is no lymphadenopathy.  Lungs are clear.  Chest is symmetric.  Heart shows a regular rate and rhythm.  S1 and S2 are normal.  There is no murmur click or gallop.  Abdomen is soft normal bowel sounds.  There is no organomegaly.  Genital and rectal deferred.  Extremities are without edema.  Peripheral pulses are adequate.  Neurologically intact.  Full range of motion.  The patient is not depressed.  Skin is warm and dry.  Assessment / Plan:

## 2011-04-20 NOTE — Assessment & Plan Note (Signed)
Blood pressure is well-controlled today. 

## 2011-04-20 NOTE — Assessment & Plan Note (Signed)
He does have a remote history of paroxysmal atrial fibrillation as been managed on flecainide therapy without recurrence of arrhythmia. 5 benign has diminished his degree of pre-excitation. I'll have him see Dr. Johney Frame.  He was admitted to the hospital on January 19, 2011 with numbness of his left arm and left face and associated with hypertension. Final diagnoses was questionable TIA. With his questionable diagnosis, I will not start him on warfarin anticoagulation now but it certainly needs to be a consideration in the future.

## 2011-04-23 NOTE — Progress Notes (Signed)
Patient called with lab results. Pt verbalized understanding. Jodette Tykel Badie RN  

## 2011-05-04 ENCOUNTER — Other Ambulatory Visit: Payer: Self-pay | Admitting: Internal Medicine

## 2011-05-04 ENCOUNTER — Other Ambulatory Visit: Payer: Self-pay

## 2011-05-04 DIAGNOSIS — E7800 Pure hypercholesterolemia, unspecified: Secondary | ICD-10-CM

## 2011-05-04 DIAGNOSIS — E538 Deficiency of other specified B group vitamins: Secondary | ICD-10-CM

## 2011-05-09 ENCOUNTER — Encounter: Payer: Self-pay | Admitting: Internal Medicine

## 2011-05-09 ENCOUNTER — Other Ambulatory Visit (INDEPENDENT_AMBULATORY_CARE_PROVIDER_SITE_OTHER): Payer: Managed Care, Other (non HMO)

## 2011-05-09 DIAGNOSIS — E7800 Pure hypercholesterolemia, unspecified: Secondary | ICD-10-CM

## 2011-05-09 DIAGNOSIS — E538 Deficiency of other specified B group vitamins: Secondary | ICD-10-CM

## 2011-05-09 LAB — LIPID PANEL
HDL: 57.6 mg/dL (ref >39.00)
Total CHOL/HDL Ratio: 3
Triglycerides: 67 mg/dL (ref 0.0–149.0)
VLDL: 13.4 mg/dL (ref 0.0–40.0)

## 2011-05-09 LAB — BASIC METABOLIC PANEL
CO2: 30 mEq/L (ref 19–32)
Calcium: 8.8 mg/dL (ref 8.4–10.5)
Chloride: 103 mEq/L (ref 96–112)
Potassium: 4.2 mEq/L (ref 3.5–5.1)
Sodium: 140 mEq/L (ref 135–145)

## 2011-05-10 ENCOUNTER — Encounter: Payer: Self-pay | Admitting: Internal Medicine

## 2011-05-11 ENCOUNTER — Encounter: Payer: Self-pay | Admitting: Internal Medicine

## 2011-05-11 ENCOUNTER — Ambulatory Visit (INDEPENDENT_AMBULATORY_CARE_PROVIDER_SITE_OTHER): Payer: Managed Care, Other (non HMO) | Admitting: Internal Medicine

## 2011-05-11 DIAGNOSIS — E538 Deficiency of other specified B group vitamins: Secondary | ICD-10-CM

## 2011-05-11 DIAGNOSIS — F329 Major depressive disorder, single episode, unspecified: Secondary | ICD-10-CM

## 2011-05-11 DIAGNOSIS — E785 Hyperlipidemia, unspecified: Secondary | ICD-10-CM

## 2011-05-11 DIAGNOSIS — I10 Essential (primary) hypertension: Secondary | ICD-10-CM

## 2011-05-11 DIAGNOSIS — I4891 Unspecified atrial fibrillation: Secondary | ICD-10-CM

## 2011-05-11 MED ORDER — SERTRALINE HCL 100 MG PO TABS: ORAL_TABLET | ORAL | Status: DC

## 2011-05-11 NOTE — Assessment & Plan Note (Signed)
Cont rx

## 2011-05-11 NOTE — Progress Notes (Signed)
Subjective:    Patient ID: Evan Farmer, male    DOB: [birth year], 60 y.o.   MRN: [id number]  HPI   The patient presents for a follow-up of  chronic hypertension, chronic dyslipidemia, depression controlled with medicines   Review of Systems  Constitutional: Negative for appetite change, fatigue and unexpected weight change.  HENT: Negative for nosebleeds, congestion, sore throat, sneezing, trouble swallowing and neck pain.   Eyes: Negative for itching and visual disturbance.  Respiratory: Negative for cough.   Cardiovascular: Negative for chest pain, palpitations and leg swelling.  Gastrointestinal: Negative for nausea, diarrhea, blood in stool and abdominal distention.  Genitourinary: Negative for frequency and hematuria.  Musculoskeletal: Negative for back pain, joint swelling and gait problem.  Skin: Negative for rash.  Neurological: Negative for dizziness, tremors, speech difficulty and weakness.  Psychiatric/Behavioral: Negative for suicidal ideas, sleep disturbance, dysphoric mood and agitation. The patient is nervous/anxious.        Objective:   Physical Exam  Constitutional: He is oriented to person, place, and time. He appears well-developed.  HENT:  Mouth/Throat: Oropharynx is clear and moist.  Eyes: Conjunctivae are normal. Pupils are equal, round, and reactive to light.  Neck: Normal range of motion. No JVD present. No thyromegaly present.  Cardiovascular: Normal rate, regular rhythm, normal heart sounds and intact distal pulses.  Exam reveals no gallop and no friction rub.   No murmur heard. Pulmonary/Chest: Effort normal and breath sounds normal. No respiratory distress. He has no wheezes. He has no rales. He exhibits no tenderness.  Abdominal: Soft. Bowel sounds are normal. He exhibits no distension and no mass. There is no tenderness. There is no rebound and no guarding.  Musculoskeletal: Normal range of motion. He exhibits no edema and no tenderness.    Lymphadenopathy:    He has no cervical adenopathy.  Neurological: He is alert and oriented to person, place, and time. He has normal reflexes. No cranial nerve deficit. He exhibits normal muscle tone. Coordination normal.  Skin: Skin is warm and dry. No rash noted.  Psychiatric: He has a normal mood and affect. His behavior is normal. Judgment and thought content normal.       Lab Results  Component Value Date   WBC 4.8 02/11/2011   HGB 15.7 02/11/2011   HCT 44.0 02/11/2011   PLT 145* 02/11/2011   CHOL 195 05/09/2011   TRIG 67.0 05/09/2011   HDL 57.60 05/09/2011   ALT 13 04/20/2011   AST 16 04/20/2011   NA 140 05/09/2011   K 4.2 05/09/2011   CL 103 05/09/2011   CREATININE 1.0 05/09/2011   BUN 14 05/09/2011   CO2 30 05/09/2011   TSH 0.46 11/07/2010   PSA 0.01* 11/07/2010   INR 0.95 02/12/2011   HGBA1C  Value: 5.2 (NOTE)                                                                       According to the ADA Clinical Practice Recommendations for 2011, when HbA1c is used as a screening test:   >=6.5%   Diagnostic of Diabetes Mellitus           (if abnormal result  is confirmed)  5.7-6.4%   Increased risk of developing Diabetes  [place]  References:Diagnosis and Classification of Diabetes Mellitus,Diabetes Care,2011,34(Suppl 1):S62-S69 and Standards of Medical Care in         Diabetes - 2011,Diabetes Care,2011,34  (Suppl 1):S11-S61. 01/20/2011      Assessment & Plan:

## 2011-05-11 NOTE — Assessment & Plan Note (Signed)
On Rx 

## 2011-05-11 NOTE — Assessment & Plan Note (Signed)
On rx 

## 2011-09-17 ENCOUNTER — Other Ambulatory Visit: Payer: Self-pay | Admitting: Internal Medicine

## 2011-09-17 MED ORDER — ATENOLOL 50 MG PO TABS: 50.0000 mg | ORAL_TABLET | Freq: Every day | ORAL | Status: DC

## 2011-09-18 ENCOUNTER — Other Ambulatory Visit: Payer: Self-pay | Admitting: *Deleted

## 2011-09-18 MED ORDER — FLECAINIDE ACETATE 100 MG PO TABS: 100.0000 mg | ORAL_TABLET | Freq: Two times a day (BID) | ORAL | Status: DC

## 2011-09-24 ENCOUNTER — Other Ambulatory Visit: Payer: Self-pay | Admitting: Internal Medicine

## 2011-09-24 NOTE — Telephone Encounter (Signed)
Spoke to [place] at [facility]  Atenolol was shipped on 09/19/11 and the Flecanide will be shipped on 09/25/11  Spoke with patient and he says he has enough to last until it arives

## 2011-09-24 NOTE — Telephone Encounter (Signed)
Pt said [facility] will not refill pt RX until we return refill request. Pt send request to pharmacy one week ago and now pt is out of the atenolol. Please call [facility] to get pt RX's filled.

## 2011-10-03 ENCOUNTER — Other Ambulatory Visit: Payer: Self-pay | Admitting: *Deleted

## 2011-10-03 MED ORDER — CYANOCOBALAMIN 500 MCG/0.1ML NA SOLN: 1.0000 mL | NASAL | Status: DC

## 2011-10-05 ENCOUNTER — Ambulatory Visit (INDEPENDENT_AMBULATORY_CARE_PROVIDER_SITE_OTHER): Payer: Managed Care, Other (non HMO)

## 2011-10-05 DIAGNOSIS — E538 Deficiency of other specified B group vitamins: Secondary | ICD-10-CM

## 2011-10-05 MED ORDER — CYANOCOBALAMIN 1000 MCG/ML IJ SOLN
1000.0000 ug | Freq: Once | INTRAMUSCULAR | Status: AC
  Administered 2011-10-05: 1000 ug via INTRAMUSCULAR

## 2011-10-16 ENCOUNTER — Other Ambulatory Visit: Payer: Self-pay | Admitting: *Deleted

## 2011-10-16 MED ORDER — CYANOCOBALAMIN 500 MCG/0.1ML NA SOLN: 1.0000 mL | NASAL | Status: DC

## 2011-10-26 ENCOUNTER — Ambulatory Visit: Payer: Managed Care, Other (non HMO) | Admitting: Internal Medicine

## 2011-12-31 ENCOUNTER — Other Ambulatory Visit: Payer: Self-pay | Admitting: Internal Medicine

## 2011-12-31 MED ORDER — ATENOLOL 50 MG PO TABS: 50.0000 mg | ORAL_TABLET | Freq: Every day | ORAL | Status: DC

## 2011-12-31 NOTE — Telephone Encounter (Signed)
[facility] [phone]

## 2012-03-07 ENCOUNTER — Ambulatory Visit (INDEPENDENT_AMBULATORY_CARE_PROVIDER_SITE_OTHER): Payer: Managed Care, Other (non HMO) | Admitting: Nurse Practitioner

## 2012-03-07 ENCOUNTER — Encounter: Payer: Self-pay | Admitting: Nurse Practitioner

## 2012-03-07 ENCOUNTER — Ambulatory Visit: Payer: Managed Care, Other (non HMO) | Admitting: Physician Assistant

## 2012-03-07 VITALS — BP 120/88 | HR 56 | Ht 68.0 in | Wt 168.0 lb

## 2012-03-07 DIAGNOSIS — E785 Hyperlipidemia, unspecified: Secondary | ICD-10-CM

## 2012-03-07 DIAGNOSIS — I10 Essential (primary) hypertension: Secondary | ICD-10-CM

## 2012-03-07 DIAGNOSIS — I480 Paroxysmal atrial fibrillation: Secondary | ICD-10-CM

## 2012-03-07 DIAGNOSIS — I498 Other specified cardiac arrhythmias: Secondary | ICD-10-CM

## 2012-03-07 DIAGNOSIS — R001 Bradycardia, unspecified: Secondary | ICD-10-CM

## 2012-03-07 DIAGNOSIS — I4891 Unspecified atrial fibrillation: Secondary | ICD-10-CM

## 2012-03-07 LAB — HEPATIC FUNCTION PANEL
ALT: 14 U/L (ref 0–53)
AST: 18 U/L (ref 0–37)
Albumin: 3.9 g/dL (ref 3.5–5.2)
Alkaline Phosphatase: 66 U/L (ref 39–117)
Bilirubin, Direct: 0.1 mg/dL (ref 0.0–0.3)
Total Bilirubin: 0.9 mg/dL (ref 0.3–1.2)
Total Protein: 6.9 g/dL (ref 6.0–8.3)

## 2012-03-07 LAB — LIPID PANEL
Cholesterol: 190 mg/dL (ref 0–200)
HDL: 53.4 mg/dL (ref >39.00)
LDL Cholesterol: 121 mg/dL — ABNORMAL HIGH (ref 0–99)
Total CHOL/HDL Ratio: 4
Triglycerides: 76 mg/dL (ref 0.0–149.0)
VLDL: 15.2 mg/dL (ref 0.0–40.0)

## 2012-03-07 LAB — BASIC METABOLIC PANEL
BUN: 16 mg/dL (ref 6–23)
CO2: 29 mEq/L (ref 19–32)
Calcium: 9 mg/dL (ref 8.4–10.5)
Chloride: 103 mEq/L (ref 96–112)
Creatinine, Ser: 1.1 mg/dL (ref 0.4–1.5)
GFR: 74.32 mL/min (ref >60.00)
Glucose, Bld: 98 mg/dL (ref 70–99)
Potassium: 4.1 mEq/L (ref 3.5–5.1)
Sodium: 139 mEq/L (ref 135–145)

## 2012-03-07 NOTE — Assessment & Plan Note (Signed)
Blood pressure is ok. He will continue to monitor.
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
## 2012-03-07 NOTE — Patient Instructions (Addendum)
We are going to check your labs today to include cholesterol levels and flecainide levels  Stay active  Stay on your current medicines. If your heart rate remains low and/or you start having dizzy spells, let us know  Keep your June appointment with Dr. Johney Frame  Call the [facility] office at [phone] if you have any questions, problems or concerns.

## 2012-03-07 NOTE — Assessment & Plan Note (Signed)
He is in sinus. He has had some very brief and transient low heart rates but is asymptomatic. I have explained to him that if this is persistent, we could cut the atenolol back and start other anti-hypertensive agents. We are rechecking all of his labs today to include drug levels for his Flecainide. He will keep his appointment with Dr. Johney Frame at the end of June. Exercise is encouraged. He is cutting down on his alcohol and I think that will help him for long term benefits. Patient is agreeable to this plan and will call if any problems develop in the interim.

## 2012-03-15 LAB — FLECAINIDE LEVEL: Flecainide: 0.5 ug/mL (ref 0.20–1.00)

## 2012-03-17 ENCOUNTER — Telehealth: Payer: Self-pay | Admitting: *Deleted

## 2012-03-17 NOTE — Telephone Encounter (Signed)
Advised patient of results.  

## 2012-03-17 NOTE — Telephone Encounter (Signed)
Message copied by Royanne Foots on Mon Mar 17, 2012  3:23 PM ------      Message from: Evan Farmer      Created: Mon Mar 17, 2012 10:33 AM       Ok to report. His flecainide level is good. No change in his medicines.

## 2012-04-10 ENCOUNTER — Other Ambulatory Visit: Payer: Self-pay | Admitting: Internal Medicine

## 2012-04-10 NOTE — Telephone Encounter (Signed)
Pt sent off to get refill from mailorder but needs 10 day supply called [facility] until he receives [employer]

## 2012-04-11 MED ORDER — FLECAINIDE ACETATE 100 MG PO TABS: 100.0000 mg | ORAL_TABLET | Freq: Two times a day (BID) | ORAL | Status: DC

## 2012-05-08 ENCOUNTER — Ambulatory Visit: Payer: Managed Care, Other (non HMO) | Admitting: Internal Medicine

## 2012-05-12 ENCOUNTER — Other Ambulatory Visit: Payer: Self-pay | Admitting: Internal Medicine

## 2012-05-12 MED ORDER — FLECAINIDE ACETATE 100 MG PO TABS: 100.0000 mg | ORAL_TABLET | Freq: Two times a day (BID) | ORAL | Status: DC

## 2012-05-20 ENCOUNTER — Other Ambulatory Visit: Payer: Self-pay | Admitting: Internal Medicine

## 2012-05-20 MED ORDER — FLECAINIDE ACETATE 100 MG PO TABS: 100.0000 mg | ORAL_TABLET | Freq: Two times a day (BID) | ORAL | Status: DC

## 2012-05-23 ENCOUNTER — Telehealth: Payer: Self-pay | Admitting: Internal Medicine

## 2012-05-23 NOTE — Telephone Encounter (Signed)
New msg [facility] wants to verify dosage and quantity of flecainide ref number [id number]

## 2012-05-26 ENCOUNTER — Other Ambulatory Visit: Payer: Self-pay | Admitting: *Deleted

## 2012-05-26 MED ORDER — FLECAINIDE ACETATE 100 MG PO TABS: 100.0000 mg | ORAL_TABLET | Freq: Two times a day (BID) | ORAL | Status: DC

## 2012-05-26 NOTE — Telephone Encounter (Signed)
One b.i.d..

## 2012-05-26 NOTE — Telephone Encounter (Signed)
Pharmacy calling to verify how patient is suppose to be taking his Tambocor. Per Tresa Endo patient is taking 100mg  BID. Spoke to [employer] from [facility].Fax Received. Refill Completed. [facility] ([facility])   mailorder.

## 2012-05-30 ENCOUNTER — Telehealth: Payer: Self-pay | Admitting: Physician Assistant

## 2012-05-30 NOTE — Telephone Encounter (Signed)
Evan Farmer called regarding flecainide prescription through [facility]. Still has not received flecainide. Reviewing prior notes, dosage and quantity needed to be specified, and it looks like it was on 05/26/12. The patient called [facility] today and this was still being processed. He states he has enough flecainide for the weekend. Advised to call office on Monday and [facility] once more to check status. Patient understood and agreed.    Jacqulyn Bath, PA-C 05/30/2012 5:23 PM

## 2012-06-18 ENCOUNTER — Ambulatory Visit: Payer: Managed Care, Other (non HMO) | Admitting: Cardiovascular Disease

## 2012-07-02 ENCOUNTER — Other Ambulatory Visit: Payer: Self-pay | Admitting: Internal Medicine

## 2012-07-02 NOTE — Telephone Encounter (Signed)
Refilled tenormin 

## 2012-07-03 ENCOUNTER — Ambulatory Visit: Payer: Managed Care, Other (non HMO) | Admitting: Internal Medicine

## 2012-07-03 ENCOUNTER — Institutional Professional Consult (permissible substitution): Payer: Managed Care, Other (non HMO) | Admitting: Internal Medicine

## 2012-07-29 ENCOUNTER — Other Ambulatory Visit: Payer: Self-pay | Admitting: Internal Medicine

## 2012-07-30 ENCOUNTER — Other Ambulatory Visit: Payer: Self-pay | Admitting: General Practice

## 2012-07-31 ENCOUNTER — Encounter: Payer: Self-pay | Admitting: Internal Medicine

## 2012-07-31 ENCOUNTER — Ambulatory Visit (INDEPENDENT_AMBULATORY_CARE_PROVIDER_SITE_OTHER): Payer: Managed Care, Other (non HMO) | Admitting: Internal Medicine

## 2012-07-31 VITALS — BP 148/90 | HR 58 | Ht 68.0 in | Wt 165.0 lb

## 2012-07-31 DIAGNOSIS — I4891 Unspecified atrial fibrillation: Secondary | ICD-10-CM

## 2012-07-31 DIAGNOSIS — I10 Essential (primary) hypertension: Secondary | ICD-10-CM

## 2012-07-31 DIAGNOSIS — I480 Paroxysmal atrial fibrillation: Secondary | ICD-10-CM
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
## 2012-07-31 NOTE — Assessment & Plan Note (Signed)
Well controlled with flecainide Given prior TIA, we should consider anticoagulation long term. Consider initiation of pradaxa upon return  Obtain an echo to evaluate for structural changes

## 2012-07-31 NOTE — Patient Instructions (Addendum)
Your physician has requested that you have an echocardiogram. Echocardiography is a painless test that uses sound waves to create images of your heart. It provides your doctor with information about the size and shape of your heart and how well your heart's chambers and valves are working. This procedure takes approximately one hour. There are no restrictions for this procedure.  Your physician wants you to follow-up in: 6 months with Norma Fredrickson, NP.  You will receive a reminder letter in the mail two months in advance. If you don't receive a letter, please call our office to schedule the follow-up appointment.  Your physician wants you to follow-up in: 1 year with Dr. Johney Frame.  You will receive a reminder letter in the mail two months in advance. If you don't receive a letter, please call our office to schedule the follow-up appointment.

## 2012-08-05 ENCOUNTER — Ambulatory Visit (HOSPITAL_COMMUNITY): Payer: Managed Care, Other (non HMO) | Attending: Internal Medicine | Admitting: Radiology

## 2012-08-05 ENCOUNTER — Other Ambulatory Visit: Payer: Self-pay

## 2012-08-05 DIAGNOSIS — I10 Essential (primary) hypertension: Secondary | ICD-10-CM

## 2012-08-05 DIAGNOSIS — I080 Rheumatic disorders of both mitral and aortic valves: Secondary | ICD-10-CM | POA: Insufficient documentation

## 2012-08-05 DIAGNOSIS — I4891 Unspecified atrial fibrillation: Secondary | ICD-10-CM | POA: Insufficient documentation

## 2012-08-05 DIAGNOSIS — I369 Nonrheumatic tricuspid valve disorder, unspecified: Secondary | ICD-10-CM | POA: Insufficient documentation

## 2012-08-05 NOTE — Progress Notes (Signed)
Echocardiogram performed.  

## 2012-10-05 IMAGING — CR DG CHEST 2V
2 series · 2 of 2 positions shown · non-contrast
Comparison: Chest radiograph 11/16/2008

CLINICAL DATA: Hypertension, numbness

CHEST - 2 VIEW

[w chest pa]
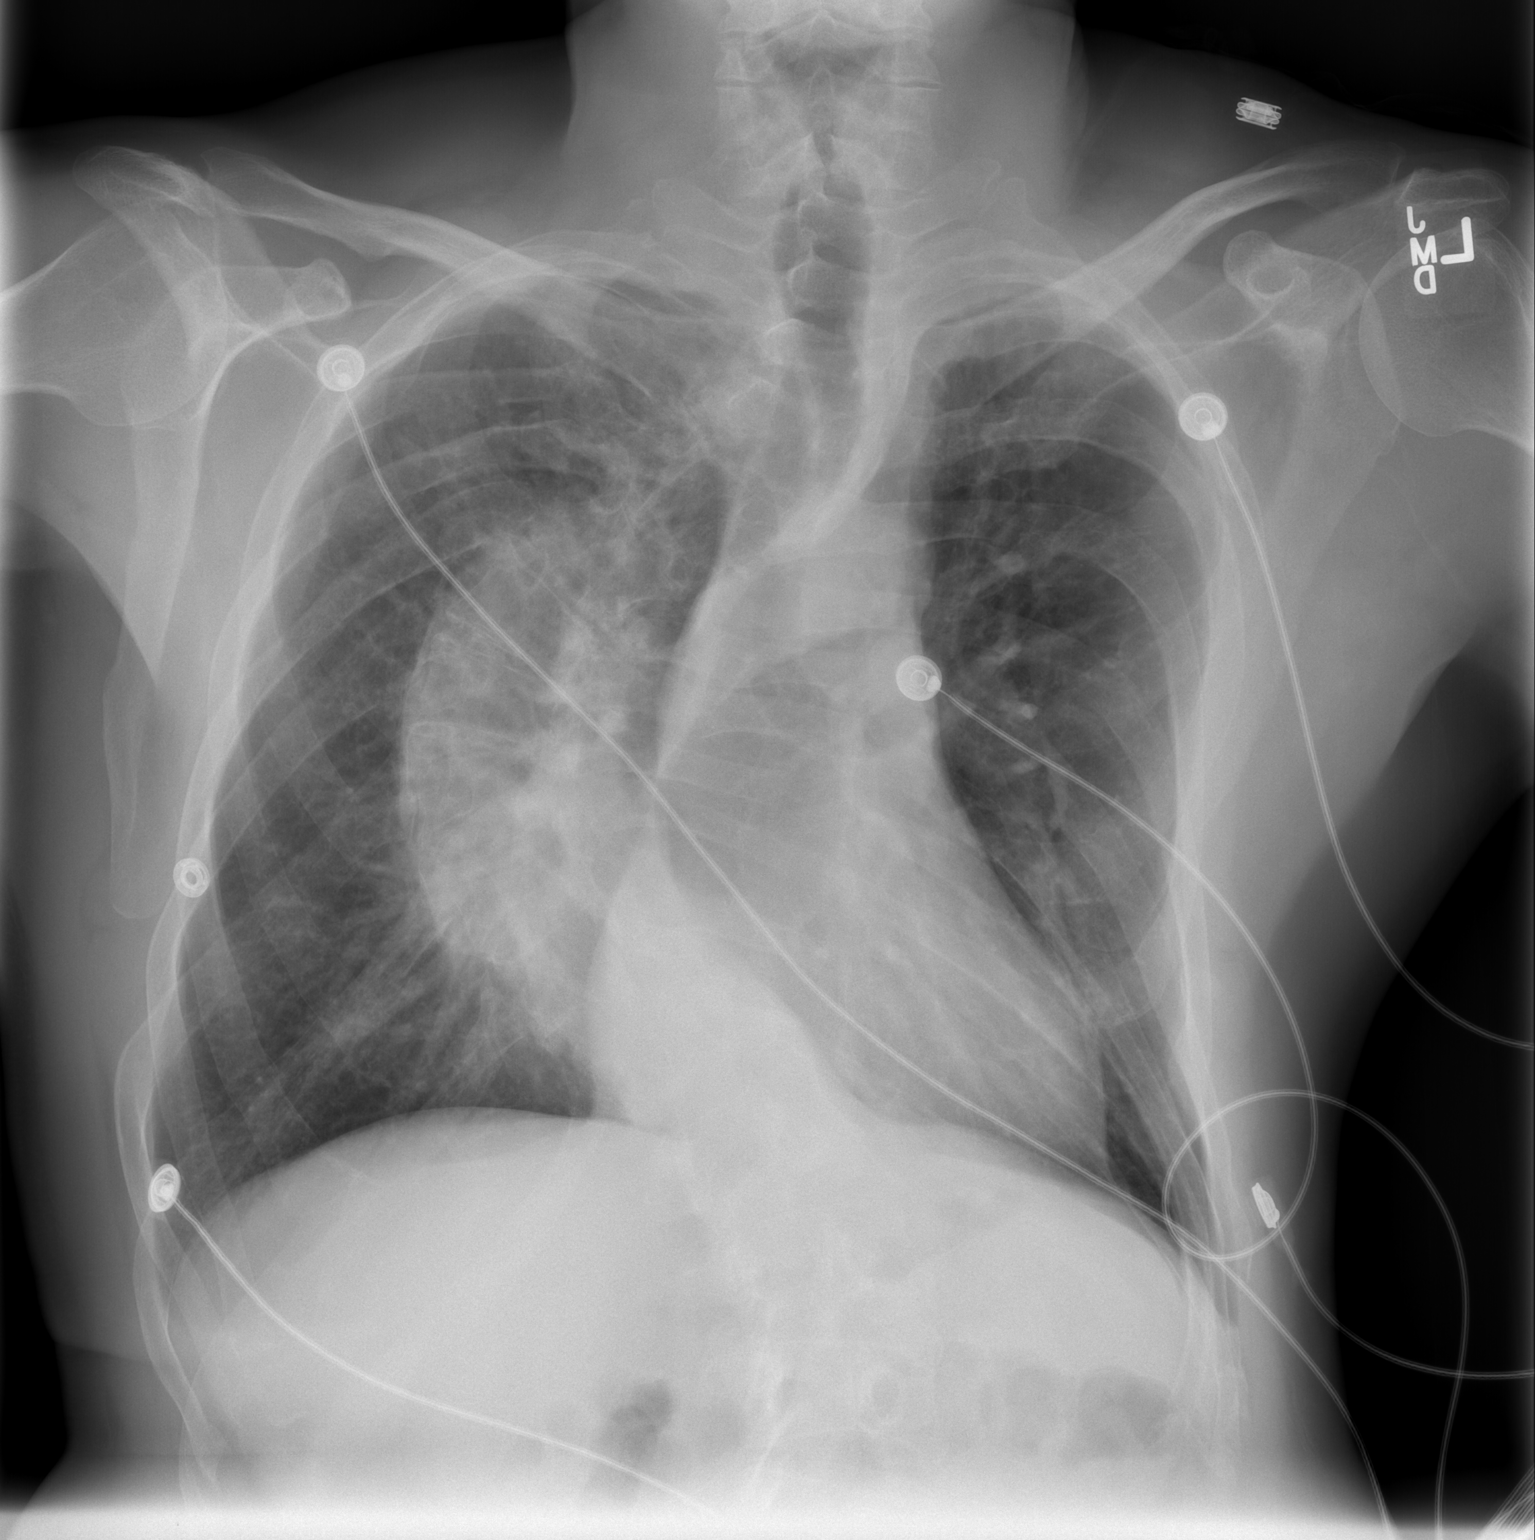

[w chest lat]
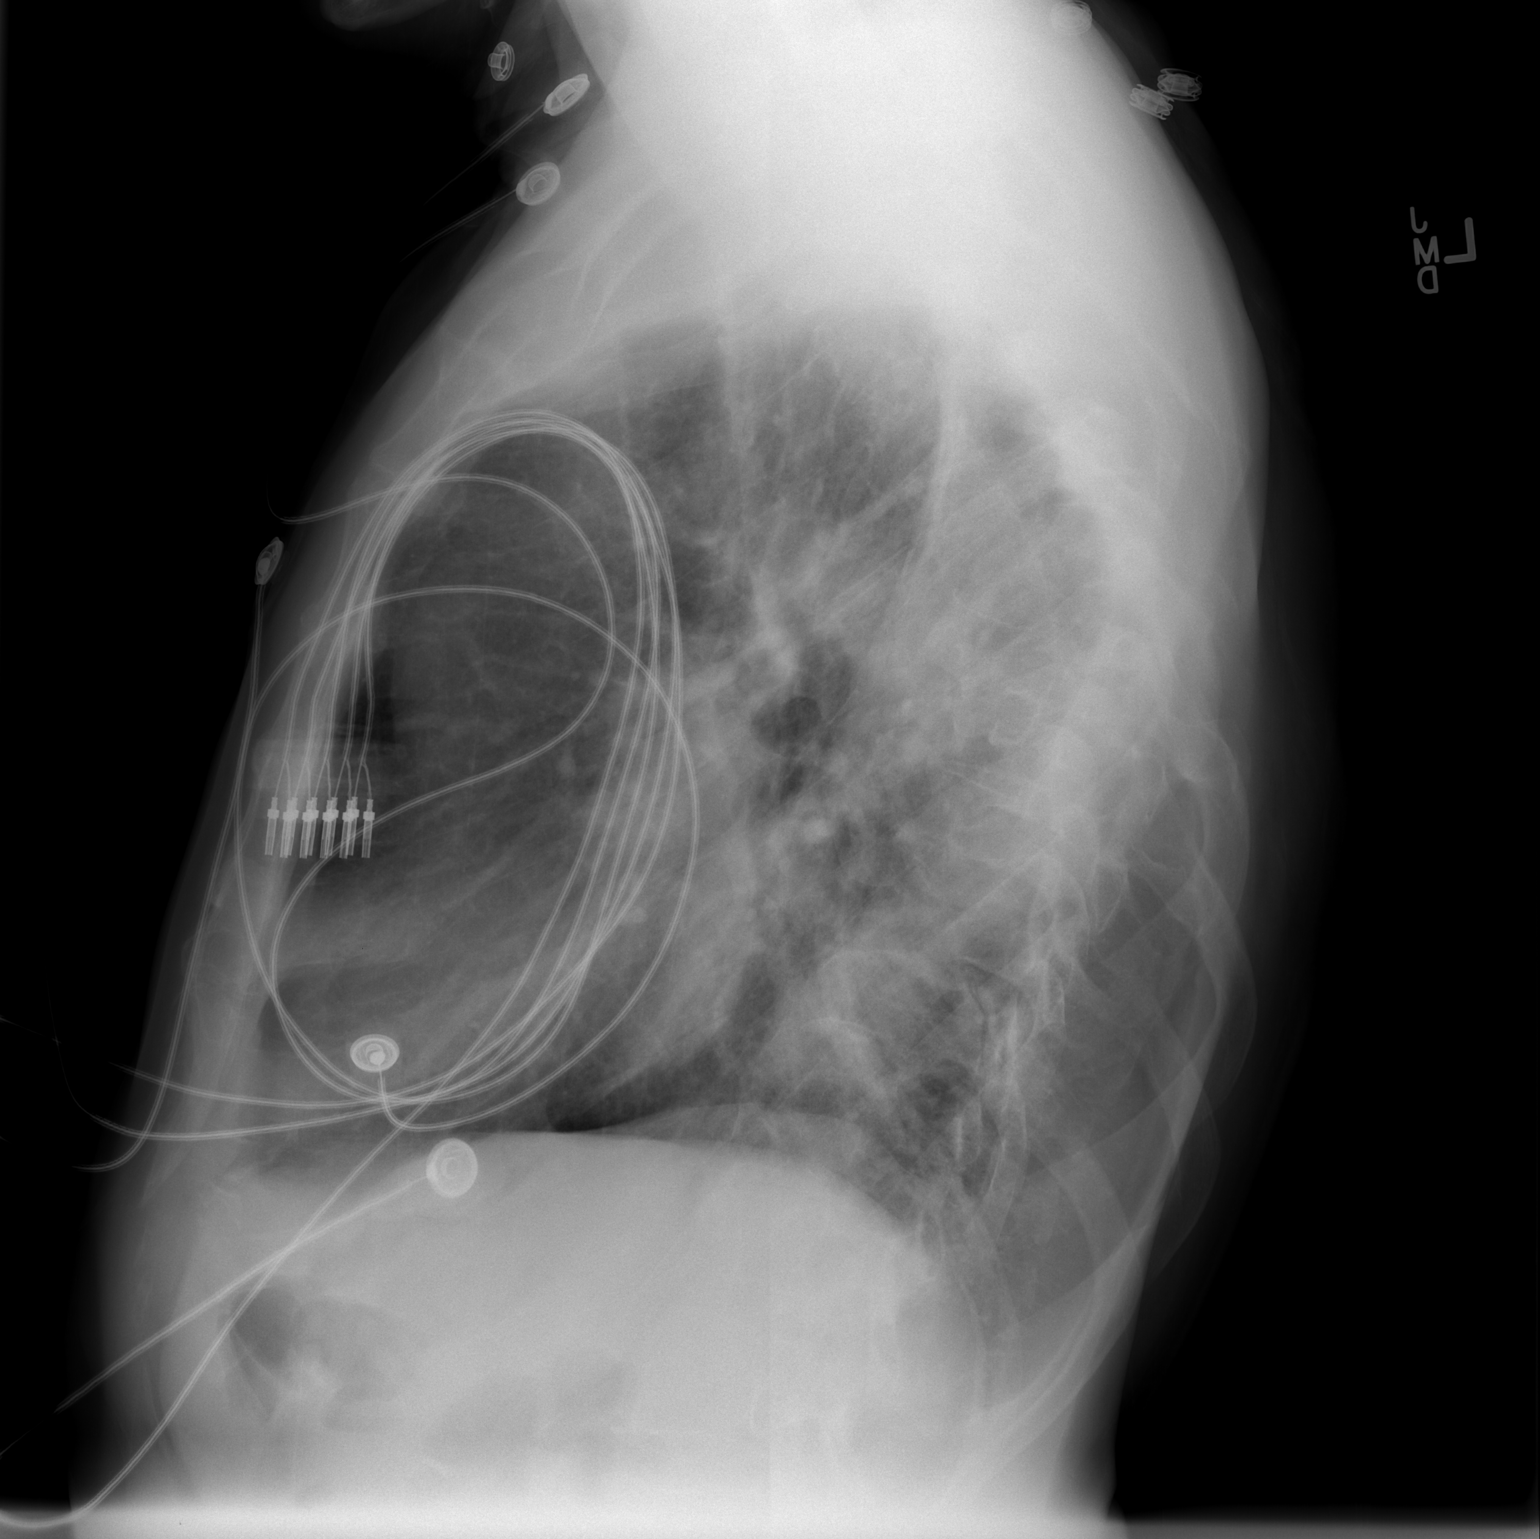

[2 of 2 positions shown; findings below may reference images not displayed]

FINDINGS: Rotatory scoliosis of the spine.  Stable cardiac
silhouette.  Lungs are hyperinflated.  No evidence of effusion,
infiltrate, or pneumothorax.
IMPRESSION: 1.  No acute cardiopulmonary process.
2.  Hyperinflated lungs.
3.  Severe rotatory scoliosis.

## 2012-10-06 IMAGING — CT CT HEAD W/O CM
1 of 2 series · 13 of 30 positions shown, 17 images · non-contrast
Comparison: None.

CLINICAL DATA: Headache, left facial and left upper extremity
tingling/numbness

CT HEAD WITHOUT CONTRAST
TECHNIQUE: Contiguous axial images were obtained from the base of
the skull through the vertex without contrast.

[Series 2: headseq 4.8 h45s · axial · 0.47mm/px · z∈[-127,-9]mm · 13 of 30 slices shown, 17 images]
[im 3/30  brain]
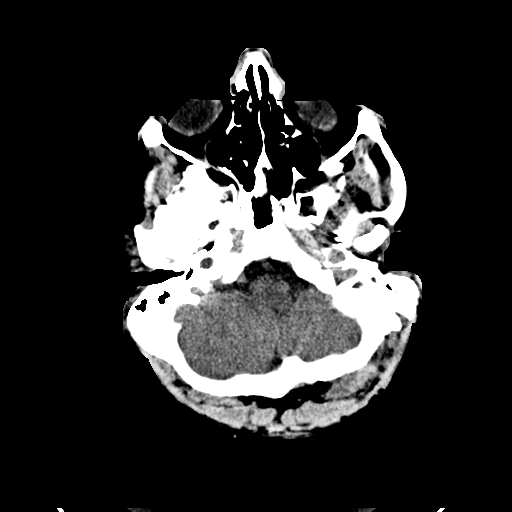
[im 3/30  bone]
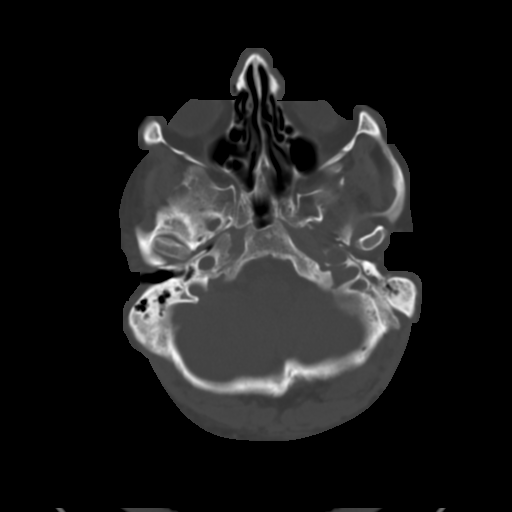
[im 5/30  brain]
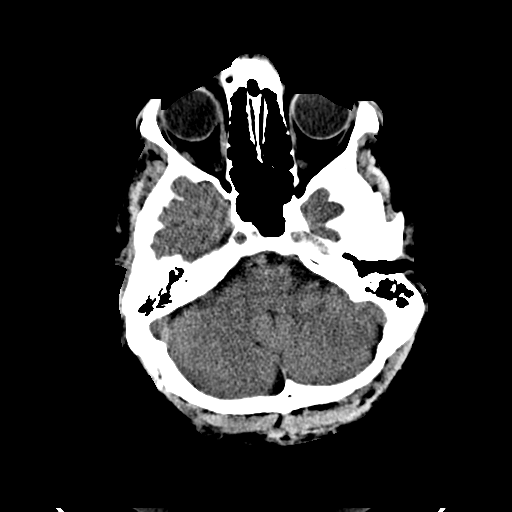
[im 7/30  brain]
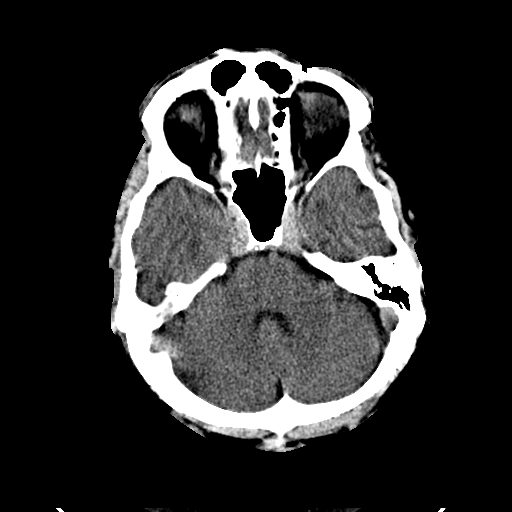
[im 9/30  brain]
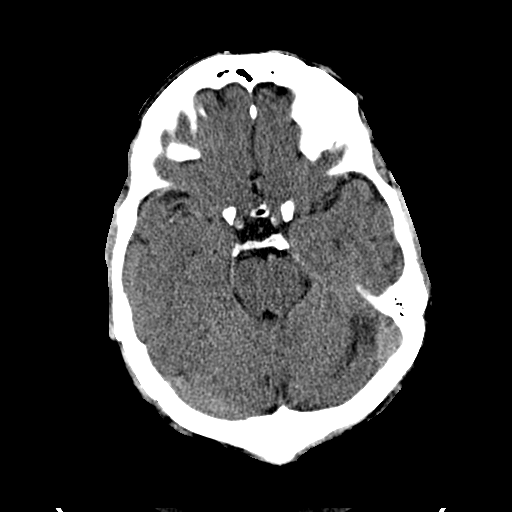
[im 11/30  brain]
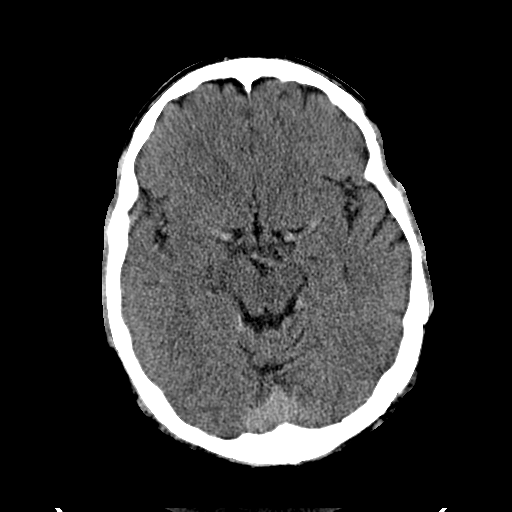
[im 11/30  bone]
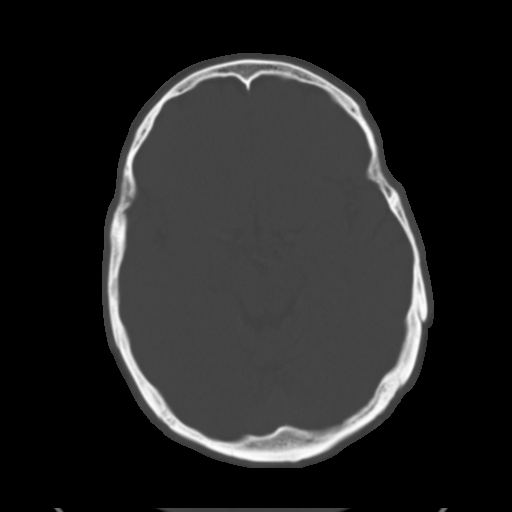
[im 13/30  brain]
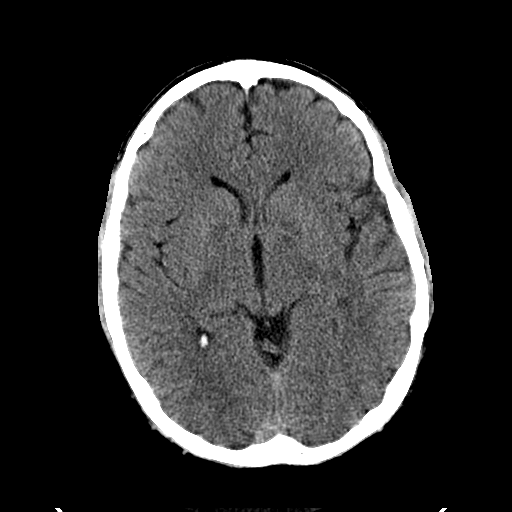
[im 15/30  brain]
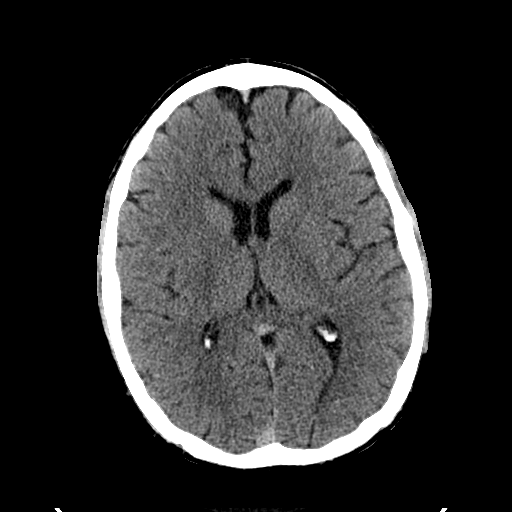
[im 17/30  brain]
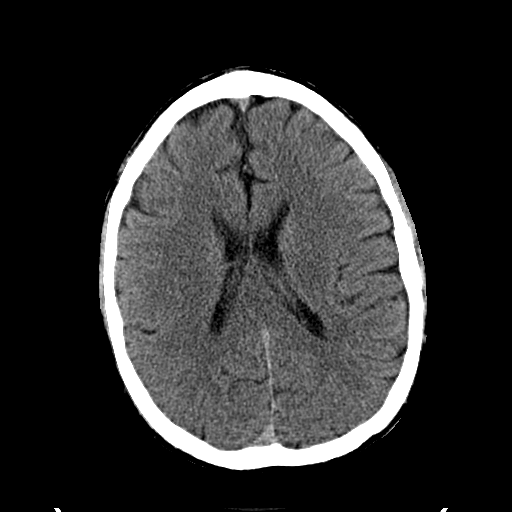
[im 19/30  brain]
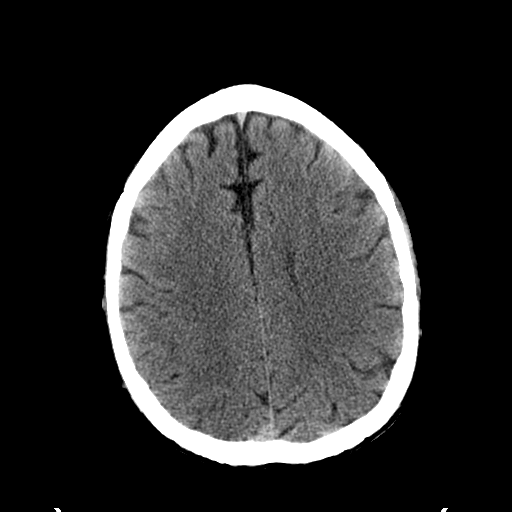
[im 19/30  bone]
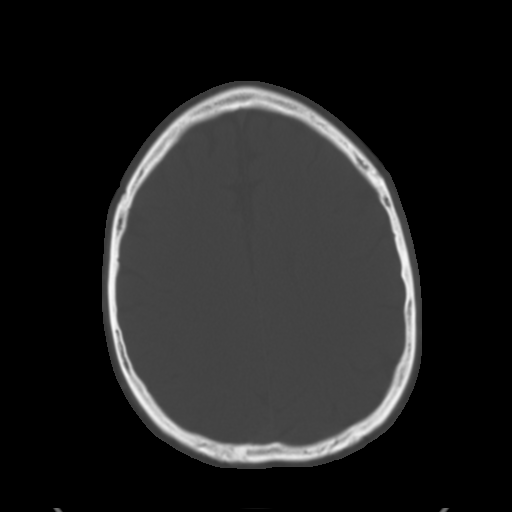
[im 21/30  brain]
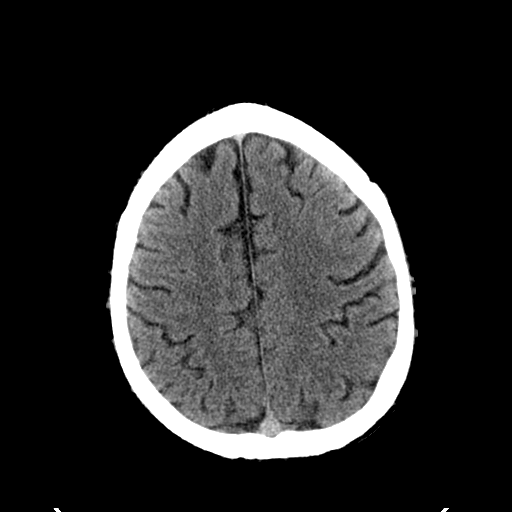
[im 23/30  brain]
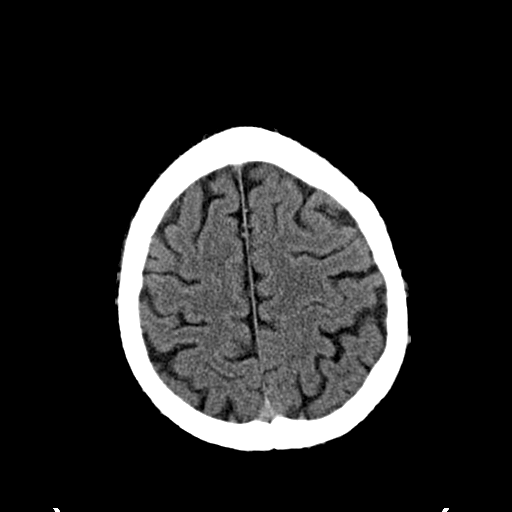
[im 25/30  brain]
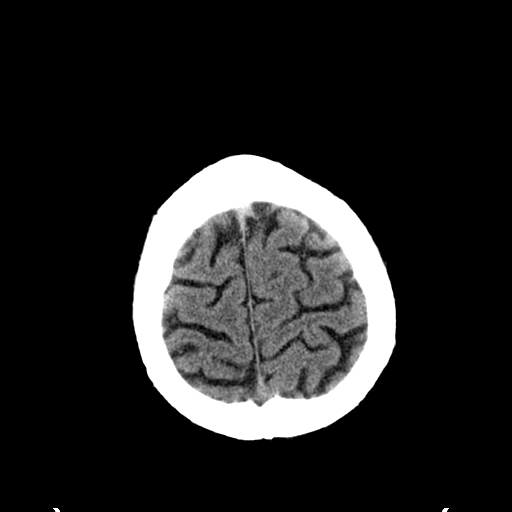
[im 27/30  brain]
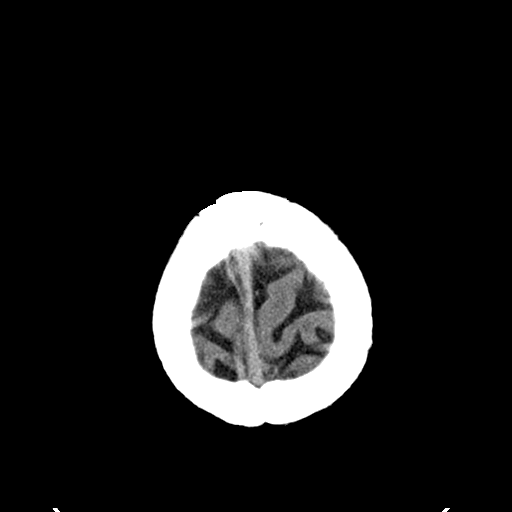
[im 27/30  bone]
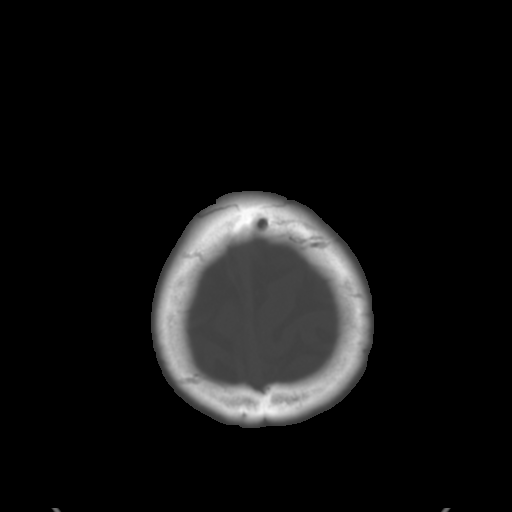

[13 of 30 positions shown; findings below may reference images not displayed]

FINDINGS: No acute hemorrhage, acute infarction, or mass lesion is
identified.  No midline shift.  No ventriculomegaly.  No skull
fracture.  Orbits and paranasal sinuses are intact.
IMPRESSION: Normal head CT.

## 2012-10-10 ENCOUNTER — Other Ambulatory Visit: Payer: Self-pay | Admitting: Internal Medicine

## 2013-01-09 ENCOUNTER — Emergency Department (INDEPENDENT_AMBULATORY_CARE_PROVIDER_SITE_OTHER)
Admission: EM | Admit: 2013-01-09 | Discharge: 2013-01-09 | Disposition: A | Discharge status: Home / Self Care | Payer: Managed Care, Other (non HMO) | Source: Home / Self Care | Attending: Family Medicine | Admitting: Family Medicine

## 2013-01-09 ENCOUNTER — Encounter (HOSPITAL_COMMUNITY): Payer: Self-pay | Admitting: *Deleted

## 2013-01-09 DIAGNOSIS — F411 Generalized anxiety disorder: Secondary | ICD-10-CM

## 2013-01-09 DIAGNOSIS — F418 Other specified anxiety disorders: Secondary | ICD-10-CM

## 2013-01-09 MED ORDER — CLONAZEPAM 1 MG PO TABS: ORAL_TABLET | ORAL | Status: DC

## 2013-01-09 NOTE — ED Notes (Signed)
States he is anxious about his brother who is dying and it ran his BP up today.  His brother has a brain tumor.

## 2013-01-09 NOTE — ED Provider Notes (Signed)
History     CSN: [id number]  Arrival date & time 01/09/13  5284   First MD Initiated Contact with Patient 01/09/13 1844      Chief Complaint  Patient presents with  . Hypertension    (Consider location/radiation/quality/duration/timing/severity/associated sxs/prior treatment) Patient is a 62 y.o. male presenting with hypertension. The history is provided by the patient.  Hypertension This is a chronic problem. Episode onset: pt noticed bp elevated today after news that is brother is near death.pt upset. Pertinent negatives include no chest pain, no abdominal pain and no shortness of breath.    Past Medical History  Diagnosis Date  . Paroxysmal a-fib   . Weight gain   . HTN (hypertension)   . Hyperlipidemia   . Rocky [place] spotted fever     age 34  . Scoliosis   . TIA (transient ischemic attack)     2012  . S/P cardiac cath April 2012    No obstructive disease. Normal LV function  . Prostate cancer     2010 [name]   . Vitamin B 12 deficiency   . Depression     Past Surgical History  Procedure Laterality Date  . Prostatectomy  11/2008    Robotic   . Nasal septum surgery      Family History  Problem Relation Age of Onset  . Arrhythmia Mother     A-FIB  . Cancer Other     lung and prostate    History  Substance Use Topics  . Smoking status: Never Smoker   . Smokeless tobacco: Never Used  . Alcohol Use: Yes     Comment: Social alcohol use      Review of Systems  Constitutional: Negative.   Respiratory: Negative.  Negative for shortness of breath.   Cardiovascular: Negative.  Negative for chest pain.  Gastrointestinal: Negative for abdominal pain.  Psychiatric/Behavioral: The patient is nervous/anxious.     Allergies  Procainamide hcl and Quinidine  Home Medications   Current Outpatient Rx  Name  Route  Sig  Dispense  Refill  . atenolol (TENORMIN) 50 MG tablet   Oral   Take 1 tablet (50 mg total) by mouth daily.   90 tablet   1   .  flecainide (TAMBOCOR) 100 MG tablet   Oral   Take 1 tablet (100 mg total) by mouth 2 (two) times daily.   180 tablet   3   . NASCOBAL 500 MCG/0.1ML SOLN      USE 1 SPRAY IN 1 NOSTRIL   PER WEEK   2.3 mL   0   . sertraline (ZOLOFT) 100 MG tablet               . VITAMIN D, ERGOCALCIFEROL, PO   Oral   Take by mouth. Patient taking one a day. Strength is unknown.          Marland Kitchen aspirin 81 MG tablet   Oral   Take 81 mg by mouth daily.           Marland Kitchen atorvastatin (LIPITOR) 10 MG tablet   Oral   Take 10 mg by mouth daily. 3-4 TIMES WEEKLY         . clonazePAM (KLONOPIN) 1 MG tablet      Take 1/2 or 1 pill twice a day as needed for anxiety   20 tablet   0   . vardenafil (LEVITRA) 20 MG tablet   Oral   Take 20 mg by mouth daily  as needed.             There were no vitals taken for this visit.  Physical Exam  Nursing note and vitals reviewed. Constitutional: He is oriented to person, place, and time. He appears well-developed and well-nourished. He appears distressed.  Eyes: Conjunctivae are normal. Pupils are equal, round, and reactive to light.  Neck: Normal range of motion. Neck supple.  Cardiovascular: Normal rate, regular rhythm, normal heart sounds and intact distal pulses.   Pulmonary/Chest: Effort normal and breath sounds normal.  Musculoskeletal: He exhibits no edema.  Lymphadenopathy:    He has no cervical adenopathy.  Neurological: He is alert and oriented to person, place, and time.  Skin: Skin is warm and dry.    ED Course  Procedures (including critical care time)  Labs Reviewed - No data to display No results found.   1. Situational anxiety       MDM          Linna Hoff, MD 01/09/13 [phone]

## 2013-01-12 ENCOUNTER — Telehealth: Payer: Self-pay | Admitting: Internal Medicine

## 2013-01-12 NOTE — Telephone Encounter (Signed)
[facility] Triage Call Report Triage Record Num: [id number] Operator: Levora Angel Patient Name: Evan Farmer Call Date & Time: 01/09/2013 5:25:14PM Patient Phone: [phone] PCP: Sonda Primes Patient Gender: Male PCP Fax : [phone] Patient DOB: 02/27/1951 Practice Name: [facility] Reason for Call: Caller: Evan Farmer/Patient; PCP: Sonda Primes (Adults only); CB#: [phone]; Call regarding High Blood Pressure; Onset: Blood pressure 185/88 at 16:15. Patient states that he attempted to call the Cardiologist with no response. Patient took an additional 1/2 tablet of Atenolol. Recheck of blood pressure at 17:29 was 206/102 pulse 67. Urgent symptom of "Systolic blood pressure of more than 180 mmhg or diastolic blood pressure of more than 120 mmhg" per Hypertension, Diagnosed or Suspected protocol. Disposition See provider within 4 hours. Patient instructed to go to Urgent Care for evaluation. Informed patient of dangers of changing his medication dosage without consulting Provider. Protocol(s) Used: Hypertension, Diagnosed or Suspected Recommended Outcome per Protocol: See Provider within 4 hours Reason for Outcome: Systolic blood pressure of more than 180 mmHg OR diastolic blood pressure of more than 120 mmHg Care Advice: ~ HEALTH PROMOTION / MAINTENANCE Medication Advice: - Discontinue all nonprescription and alternative medications, especially stimulants, until evaluated by provider. - Take prescribed medications as directed, following label instructions for the medication. - Do not change medications or dosing regimen until provider is consulted. - Know possible side effects of medication and what to do if they occur. - Tell provider all prescription, nonprescription or alternative medications that you take

## 2013-01-12 NOTE — Telephone Encounter (Signed)
Is he ok now. OV today if needed - ok to w/in Thx

## 2013-01-13 NOTE — Telephone Encounter (Signed)
Left message for patient to call back and schedule appt

## 2013-01-13 NOTE — Telephone Encounter (Signed)
I'm sorry to hear that. OK to take Clonazepam prn Keep ROV tomorrow Thx

## 2013-01-13 NOTE — Telephone Encounter (Signed)
Patient calling, made an appointment for 0915 on 02/03/2023.  His brother passed away on 11-Apr-2023, just after he was seen on Friday and dx with the high blood pressure and given the Clonazapam.  He has only taken a few of the pills and has more available.  He has visitation at the funeral home this evening at 530.  His b/p was checked this afternoon and same was 126/64.

## 2013-01-14 ENCOUNTER — Ambulatory Visit (INDEPENDENT_AMBULATORY_CARE_PROVIDER_SITE_OTHER): Payer: Managed Care, Other (non HMO) | Admitting: Internal Medicine

## 2013-01-14 ENCOUNTER — Encounter: Payer: Self-pay | Admitting: Internal Medicine

## 2013-01-14 VITALS — BP 140/92 | HR 76 | Temp 98.3°F | Resp 16 | Wt 174.0 lb

## 2013-01-14 DIAGNOSIS — F3289 Other specified depressive episodes: Secondary | ICD-10-CM

## 2013-01-14 DIAGNOSIS — F411 Generalized anxiety disorder: Secondary | ICD-10-CM

## 2013-01-14 DIAGNOSIS — I10 Essential (primary) hypertension: Secondary | ICD-10-CM

## 2013-01-14 DIAGNOSIS — F329 Major depressive disorder, single episode, unspecified: Secondary | ICD-10-CM

## 2013-01-14 DIAGNOSIS — Z8546 Personal history of malignant neoplasm of prostate: Secondary | ICD-10-CM

## 2013-01-14 DIAGNOSIS — E538 Deficiency of other specified B group vitamins: Secondary | ICD-10-CM

## 2013-01-14 DIAGNOSIS — I4891 Unspecified atrial fibrillation: Secondary | ICD-10-CM

## 2013-01-14 MED ORDER — LISINOPRIL 5 MG PO TABS: 5.0000 mg | ORAL_TABLET | Freq: Every day | ORAL | Status: DC

## 2013-01-14 MED ORDER — SERTRALINE HCL 100 MG PO TABS: 100.0000 mg | ORAL_TABLET | Freq: Every day | ORAL | Status: DC | PRN

## 2013-01-14 MED ORDER — ATORVASTATIN CALCIUM 10 MG PO TABS: 10.0000 mg | ORAL_TABLET | Freq: Every day | ORAL | Status: DC

## 2013-01-14 NOTE — Assessment & Plan Note (Signed)
Continue with current prescription therapy as reflected on the Med list.  

## 2013-01-14 NOTE — Progress Notes (Signed)
Subjective:     Hypertension This is a chronic problem. The current episode started more than 1 year ago. The problem is uncontrolled (due to stress). Pertinent negatives include no chest pain, neck pain or palpitations.  His brother [name] just died last 2023-04-17 - BP was 206/100   The patient presents for a follow-up of  chronic hypertension, chronic dyslipidemia, depression controlled with medicines   Review of Systems  Constitutional: Negative for appetite change, fatigue and unexpected weight change.  HENT: Negative for nosebleeds, congestion, sore throat, sneezing, trouble swallowing and neck pain.   Eyes: Negative for itching and visual disturbance.  Respiratory: Negative for cough.   Cardiovascular: Negative for chest pain, palpitations and leg swelling.  Gastrointestinal: Negative for nausea, diarrhea, blood in stool and abdominal distention.  Genitourinary: Negative for frequency and hematuria.  Musculoskeletal: Negative for back pain, joint swelling and gait problem.  Skin: Negative for rash.  Neurological: Negative for dizziness, tremors, speech difficulty and weakness.  Psychiatric/Behavioral: Negative for suicidal ideas, sleep disturbance, dysphoric mood and agitation. The patient is nervous/anxious.        Objective:   Physical Exam  Constitutional: He is oriented to person, place, and time. He appears well-developed.  HENT:  Mouth/Throat: Oropharynx is clear and moist.  Eyes: Conjunctivae are normal. Pupils are equal, round, and reactive to light.  Neck: Normal range of motion. No JVD present. No thyromegaly present.  Cardiovascular: Normal rate, regular rhythm, normal heart sounds and intact distal pulses.  Exam reveals no gallop and no friction rub.   No murmur heard. Pulmonary/Chest: Effort normal and breath sounds normal. No respiratory distress. He has no wheezes. He has no rales. He exhibits no tenderness.  Abdominal: Soft. Bowel sounds are normal. He  exhibits no distension and no mass. There is no tenderness. There is no rebound and no guarding.  Musculoskeletal: Normal range of motion. He exhibits no edema and no tenderness.  Lymphadenopathy:    He has no cervical adenopathy.  Neurological: He is alert and oriented to person, place, and time. He has normal reflexes. No cranial nerve deficit. He exhibits normal muscle tone. Coordination normal.  Skin: Skin is warm and dry. No rash noted.  Psychiatric: He has a normal mood and affect. His behavior is normal. Judgment and thought content normal.       Lab Results  Component Value Date   WBC 4.8 02/11/2011   HGB 15.7 02/11/2011   HCT 44.0 02/11/2011   PLT 145* 02/11/2011   CHOL 190 03/07/2012   TRIG 76.0 03/07/2012   HDL 53.40 03/07/2012   ALT 14 03/07/2012   AST 18 03/07/2012   NA 139 03/07/2012   K 4.1 03/07/2012   CL 103 03/07/2012   CREATININE 1.1 03/07/2012   BUN 16 03/07/2012   CO2 29 03/07/2012   TSH 0.46 11/07/2010   PSA 0.01* 11/07/2010   INR 0.95 02/12/2011   HGBA1C  Value: 5.2 (NOTE)                                                                       According to the ADA Clinical Practice Recommendations for 2011, when HbA1c is used as a screening test:   >=6.5%   Diagnostic of Diabetes  Mellitus           (if abnormal result  is confirmed)  5.7-6.4%   Increased risk of developing Diabetes Mellitus  References:Diagnosis and Classification of Diabetes Mellitus,Diabetes Care,2011,34(Suppl 1):S62-S69 and Standards of Medical Care in         Diabetes - 2011,Diabetes Care,2011,34  (Suppl 1):S11-S61. 01/20/2011      Assessment & Plan:

## 2013-01-14 NOTE — Assessment & Plan Note (Signed)
Continue with current prescription therapy as reflected on the Med list. Will add Lisinopril if needed Clonazepm prn

## 2013-01-14 NOTE — Assessment & Plan Note (Signed)
Doing well 

## 2013-01-14 NOTE — Assessment & Plan Note (Signed)
Back on Zoloft

## 2013-01-14 NOTE — Assessment & Plan Note (Signed)
Worse - Klonopin prn

## 2013-01-26 ENCOUNTER — Other Ambulatory Visit: Payer: Self-pay | Admitting: Internal Medicine

## 2013-05-05 ENCOUNTER — Other Ambulatory Visit: Payer: Self-pay | Admitting: Internal Medicine

## 2013-05-12 ENCOUNTER — Telehealth: Payer: Self-pay | Admitting: *Deleted

## 2013-05-12 NOTE — Telephone Encounter (Signed)
Pt called in reference to [facility].  Called [facility] verified dispense quantity.  Informed pt.

## 2013-06-10 ENCOUNTER — Other Ambulatory Visit: Payer: Self-pay | Admitting: Internal Medicine

## 2013-07-17 ENCOUNTER — Ambulatory Visit (INDEPENDENT_AMBULATORY_CARE_PROVIDER_SITE_OTHER): Payer: Managed Care, Other (non HMO) | Admitting: Internal Medicine

## 2013-07-17 ENCOUNTER — Encounter: Payer: Self-pay | Admitting: Internal Medicine

## 2013-07-17 VITALS — BP 160/90 | Temp 98.5°F | Wt 167.0 lb

## 2013-07-17 DIAGNOSIS — F329 Major depressive disorder, single episode, unspecified: Secondary | ICD-10-CM

## 2013-07-17 DIAGNOSIS — I10 Essential (primary) hypertension: Secondary | ICD-10-CM

## 2013-07-17 DIAGNOSIS — E559 Vitamin D deficiency, unspecified: Secondary | ICD-10-CM

## 2013-07-17 DIAGNOSIS — Z23 Encounter for immunization: Secondary | ICD-10-CM

## 2013-07-17 DIAGNOSIS — I4891 Unspecified atrial fibrillation: Secondary | ICD-10-CM

## 2013-07-17 MED ORDER — ATORVASTATIN CALCIUM 10 MG PO TABS: 10.0000 mg | ORAL_TABLET | Freq: Every day | ORAL | Status: DC

## 2013-07-17 NOTE — Assessment & Plan Note (Signed)
Chronic. Nl BP at home Continue with current prescription therapy as reflected on the Med list.  

## 2013-07-17 NOTE — Progress Notes (Signed)
Subjective:     Hypertension This is a chronic problem. The current episode started more than 1 year ago. The problem is uncontrolled (due to stress). Pertinent negatives include no chest pain, neck pain or palpitations.  His brother Evan Farmer died in spring 2014   The patient presents for a follow-up of  chronic hypertension, chronic dyslipidemia, depression controlled with medicines   Review of Systems  Constitutional: Negative for appetite change, fatigue and unexpected weight change.  HENT: Negative for nosebleeds, congestion, sore throat, sneezing, trouble swallowing and neck pain.   Eyes: Negative for itching and visual disturbance.  Respiratory: Negative for cough.   Cardiovascular: Negative for chest pain, palpitations and leg swelling.  Gastrointestinal: Negative for nausea, diarrhea, blood in stool and abdominal distention.  Genitourinary: Negative for frequency and hematuria.  Musculoskeletal: Negative for back pain, joint swelling and gait problem.  Skin: Negative for rash.  Neurological: Negative for dizziness, tremors, speech difficulty and weakness.  Psychiatric/Behavioral: Negative for suicidal ideas, sleep disturbance, dysphoric mood and agitation. The patient is nervous/anxious.        Objective:   Physical Exam  Constitutional: He is oriented to person, place, and time. He appears well-developed.  HENT:  Mouth/Throat: Oropharynx is clear and moist.  Eyes: Conjunctivae are normal. Pupils are equal, round, and reactive to light.  Neck: Normal range of motion. No JVD present. No thyromegaly present.  Cardiovascular: Normal rate, regular rhythm, normal heart sounds and intact distal pulses.  Exam reveals no gallop and no friction rub.   No murmur heard. Pulmonary/Chest: Effort normal and breath sounds normal. No respiratory distress. He has no wheezes. He has no rales. He exhibits no tenderness.  Abdominal: Soft. Bowel sounds are normal. He exhibits no distension  and no mass. There is no tenderness. There is no rebound and no guarding.  Musculoskeletal: Normal range of motion. He exhibits no edema and no tenderness.  Lymphadenopathy:    He has no cervical adenopathy.  Neurological: He is alert and oriented to person, place, and time. He has normal reflexes. No cranial nerve deficit. He exhibits normal muscle tone. Coordination normal.  Skin: Skin is warm and dry. No rash noted.  Psychiatric: He has a normal mood and affect. His behavior is normal. Judgment and thought content normal.       Lab Results  Component Value Date   WBC 4.8 02/11/2011   HGB 15.7 02/11/2011   HCT 44.0 02/11/2011   PLT 145* 02/11/2011   CHOL 190 03/07/2012   TRIG 76.0 03/07/2012   HDL 53.40 03/07/2012   ALT 14 03/07/2012   AST 18 03/07/2012   NA 139 03/07/2012   K 4.1 03/07/2012   CL 103 03/07/2012   CREATININE 1.1 03/07/2012   BUN 16 03/07/2012   CO2 29 03/07/2012   TSH 0.46 11/07/2010   PSA 0.01* 11/07/2010   INR 0.95 02/12/2011   HGBA1C  Value: 5.2 (NOTE)                                                                       According to the ADA Clinical Practice Recommendations for 2011, when HbA1c is used as a screening test:   >=6.5%   Diagnostic of Diabetes Mellitus           (  if abnormal result  is confirmed)  5.7-6.4%   Increased risk of developing Diabetes Mellitus  References:Diagnosis and Classification of Diabetes Mellitus,Diabetes Care,2011,34(Suppl 1):S62-S69 and Standards of Medical Care in         Diabetes - 2011,Diabetes Care,2011,34  (Suppl 1):S11-S61. 01/20/2011      Assessment & Plan:

## 2013-07-17 NOTE — Assessment & Plan Note (Signed)
Continue with current prescription therapy as reflected on the Med list.  

## 2013-08-12 ENCOUNTER — Other Ambulatory Visit: Payer: Self-pay | Admitting: Internal Medicine

## 2013-11-26 ENCOUNTER — Other Ambulatory Visit: Payer: Self-pay | Admitting: Internal Medicine

## 2013-12-02 ENCOUNTER — Other Ambulatory Visit: Payer: Self-pay | Admitting: *Deleted

## 2013-12-02 MED ORDER — FLECAINIDE ACETATE 100 MG PO TABS: ORAL_TABLET | ORAL | Status: DC

## 2013-12-03 ENCOUNTER — Other Ambulatory Visit: Payer: Self-pay | Admitting: *Deleted

## 2013-12-03 MED ORDER — FLECAINIDE ACETATE 100 MG PO TABS: ORAL_TABLET | ORAL | Status: DC

## 2013-12-03 NOTE — Telephone Encounter (Signed)
refill 

## 2014-01-19 ENCOUNTER — Other Ambulatory Visit (INDEPENDENT_AMBULATORY_CARE_PROVIDER_SITE_OTHER): Payer: Managed Care, Other (non HMO)

## 2014-01-19 ENCOUNTER — Encounter: Payer: Self-pay | Admitting: Internal Medicine

## 2014-01-19 ENCOUNTER — Ambulatory Visit (INDEPENDENT_AMBULATORY_CARE_PROVIDER_SITE_OTHER): Payer: Managed Care, Other (non HMO) | Admitting: Internal Medicine

## 2014-01-19 VITALS — BP 166/90 | HR 80 | Temp 98.7°F | Resp 16 | Ht 68.0 in | Wt 173.0 lb

## 2014-01-19 DIAGNOSIS — Z1211 Encounter for screening for malignant neoplasm of colon: Secondary | ICD-10-CM

## 2014-01-19 DIAGNOSIS — Z2911 Encounter for prophylactic immunotherapy for respiratory syncytial virus (RSV): Secondary | ICD-10-CM

## 2014-01-19 DIAGNOSIS — R972 Elevated prostate specific antigen [PSA]: Secondary | ICD-10-CM

## 2014-01-19 DIAGNOSIS — E538 Deficiency of other specified B group vitamins: Secondary | ICD-10-CM

## 2014-01-19 DIAGNOSIS — Z0000 Encounter for general adult medical examination without abnormal findings: Secondary | ICD-10-CM

## 2014-01-19 DIAGNOSIS — I10 Essential (primary) hypertension: Secondary | ICD-10-CM

## 2014-01-19 DIAGNOSIS — I4891 Unspecified atrial fibrillation: Secondary | ICD-10-CM

## 2014-01-19 DIAGNOSIS — Z23 Encounter for immunization: Secondary | ICD-10-CM

## 2014-01-19 LAB — CBC WITH DIFFERENTIAL/PLATELET
BASOS ABS: 0 10*3/uL (ref 0.0–0.1)
Basophils Relative: 0.3 % (ref 0.0–3.0)
Eosinophils Absolute: 0.1 10*3/uL (ref 0.0–0.7)
Eosinophils Relative: 1.1 % (ref 0.0–5.0)
HEMATOCRIT: 48.8 % (ref 39.0–52.0)
HEMOGLOBIN: 16.5 g/dL (ref 13.0–17.0)
Lymphocytes Relative: 14.4 % (ref 12.0–46.0)
Lymphs Abs: 0.8 10*3/uL (ref 0.7–4.0)
MCHC: 33.9 g/dL (ref 30.0–36.0)
MCV: 97 fl (ref 78.0–100.0)
MONO ABS: 0.4 10*3/uL (ref 0.1–1.0)
Monocytes Relative: 7.1 % (ref 3.0–12.0)
Neutro Abs: 4.1 10*3/uL (ref 1.4–7.7)
Neutrophils Relative %: 77.1 % — ABNORMAL HIGH (ref 43.0–77.0)
PLATELETS: 184 10*3/uL (ref 150.0–400.0)
RBC: 5.03 Mil/uL (ref 4.22–5.81)
RDW: 13.7 % (ref 11.5–14.6)
WBC: 5.3 10*3/uL (ref 4.5–10.5)

## 2014-01-19 LAB — BASIC METABOLIC PANEL
BUN: 15 mg/dL (ref 6–23)
CHLORIDE: 105 meq/L (ref 96–112)
CO2: 32 mEq/L (ref 19–32)
Calcium: 8.9 mg/dL (ref 8.4–10.5)
Creatinine, Ser: 1 mg/dL (ref 0.4–1.5)
GFR: 78.89 mL/min (ref >60.00)
GLUCOSE: 97 mg/dL (ref 70–99)
Potassium: 4.7 mEq/L (ref 3.5–5.1)
SODIUM: 141 meq/L (ref 135–145)

## 2014-01-19 LAB — LIPID PANEL
CHOL/HDL RATIO: 4
Cholesterol: 207 mg/dL — ABNORMAL HIGH (ref 0–200)
HDL: 56.4 mg/dL (ref >39.00)
LDL Cholesterol: 141 mg/dL — ABNORMAL HIGH (ref 0–99)
Triglycerides: 50 mg/dL (ref 0.0–149.0)
VLDL: 10 mg/dL (ref 0.0–40.0)

## 2014-01-19 LAB — HEPATIC FUNCTION PANEL
ALK PHOS: 73 U/L (ref 39–117)
ALT: 13 U/L (ref 0–53)
AST: 20 U/L (ref 0–37)
Albumin: 3.8 g/dL (ref 3.5–5.2)
BILIRUBIN DIRECT: 0.2 mg/dL (ref 0.0–0.3)
BILIRUBIN TOTAL: 0.8 mg/dL (ref 0.3–1.2)
Total Protein: 6.7 g/dL (ref 6.0–8.3)

## 2014-01-19 LAB — VITAMIN B12: Vitamin B-12: 343 pg/mL (ref 211–911)

## 2014-01-19 LAB — URINALYSIS, ROUTINE W REFLEX MICROSCOPIC
Ketones, ur: NEGATIVE
Leukocytes, UA: NEGATIVE
Nitrite: NEGATIVE
Total Protein, Urine: NEGATIVE
URINE GLUCOSE: NEGATIVE
Urobilinogen, UA: 1 (ref 0.0–1.0)
WBC, UA: NONE SEEN (ref 0–?)
pH: 6 (ref 5.0–8.0)

## 2014-01-19 LAB — PSA: PSA: 0 ng/mL — AB (ref 0.10–4.00)

## 2014-01-19 LAB — TSH: TSH: 0.75 u[IU]/mL (ref 0.35–5.50)

## 2014-01-19 MED ORDER — CYANOCOBALAMIN 500 MCG/0.1ML NA SOLN: NASAL | Status: DC

## 2014-01-19 NOTE — Progress Notes (Signed)
Subjective:   The patient is here for a wellness exam. The patient has been doing well overall without major physical or psychological issues going on lately.  C/o diarrhea x 2 wks off and on   Hypertension This is a chronic problem. The current episode started more than 1 year ago. The problem is uncontrolled (due to stress). Pertinent negatives include no chest pain, neck pain or palpitations.  His brother Chrissie Noa died in spring 2014   The patient presents for a follow-up of  chronic hypertension, chronic dyslipidemia, depression controlled with medicines. BP OK at home   Review of Systems  Constitutional: Negative for appetite change, fatigue and unexpected weight change.  HENT: Negative for congestion, nosebleeds, sneezing, sore throat and trouble swallowing.   Eyes: Negative for itching and visual disturbance.  Respiratory: Negative for cough.   Cardiovascular: Negative for chest pain, palpitations and leg swelling.  Gastrointestinal: Negative for nausea, diarrhea, blood in stool and abdominal distention.  Genitourinary: Negative for frequency and hematuria.  Musculoskeletal: Negative for back pain, gait problem, joint swelling and neck pain.  Skin: Negative for rash.  Neurological: Negative for dizziness, tremors, speech difficulty and weakness.  Psychiatric/Behavioral: Negative for suicidal ideas, sleep disturbance, dysphoric mood and agitation. The patient is nervous/anxious.        Objective:   Physical Exam  Constitutional: He is oriented to person, place, and time. He appears well-developed.  HENT:  Mouth/Throat: Oropharynx is clear and moist.  Eyes: Conjunctivae are normal. Pupils are equal, round, and reactive to light.  Neck: Normal range of motion. No JVD present. No thyromegaly present.  Cardiovascular: Normal rate, regular rhythm, normal heart sounds and intact distal pulses.  Exam reveals no gallop and no friction rub.   No murmur heard. Pulmonary/Chest:  Effort normal and breath sounds normal. No respiratory distress. He has no wheezes. He has no rales. He exhibits no tenderness.  Abdominal: Soft. Bowel sounds are normal. He exhibits no distension and no mass. There is no tenderness. There is no rebound and no guarding.  Musculoskeletal: Normal range of motion. He exhibits no edema and no tenderness.  Lymphadenopathy:    He has no cervical adenopathy.  Neurological: He is alert and oriented to person, place, and time. He has normal reflexes. No cranial nerve deficit. He exhibits normal muscle tone. Coordination normal.  Skin: Skin is warm and dry. No rash noted.  Psychiatric: He has a normal mood and affect. His behavior is normal. Judgment and thought content normal.       Lab Results  Component Value Date   WBC 4.8 02/11/2011   HGB 15.7 02/11/2011   HCT 44.0 02/11/2011   PLT 145* 02/11/2011   CHOL 190 03/07/2012   TRIG 76.0 03/07/2012   HDL 53.40 03/07/2012   ALT 14 03/07/2012   AST 18 03/07/2012   NA 139 03/07/2012   K 4.1 03/07/2012   CL 103 03/07/2012   CREATININE 1.1 03/07/2012   BUN 16 03/07/2012   CO2 29 03/07/2012   TSH 0.46 11/07/2010   PSA 0.01* 11/07/2010   INR 0.95 02/12/2011   HGBA1C  Value: 5.2 (NOTE)  According to the ADA Clinical Practice Recommendations for 2011, when HbA1c is used as a screening test:   >=6.5%   Diagnostic of Diabetes Mellitus           (if abnormal result  is confirmed)  5.7-6.4%   Increased risk of developing Diabetes Mellitus  References:Diagnosis and Classification of Diabetes Mellitus,Diabetes IFOY,7741,28(NOMVE 1):S62-S69 and Standards of Medical Care in         Diabetes - 2011,Diabetes HMCN,4709,62  (Suppl 1):S11-S61. 01/20/2011      Assessment & Plan:

## 2014-01-19 NOTE — Assessment & Plan Note (Signed)
Chronic Nl BP at home 

## 2014-01-19 NOTE — Assessment & Plan Note (Signed)
Continue with current prescription therapy as reflected on the Med list.  

## 2014-01-19 NOTE — Assessment & Plan Note (Signed)
Urol q 12 mo

## 2014-01-19 NOTE — Assessment & Plan Note (Signed)
We discussed age appropriate health related issues, including available/recomended screening tests and vaccinations. We discussed a need for adhering to healthy diet and exercise. Labs/EKG were reviewed/ordered. All questions were answered.   

## 2014-01-19 NOTE — Progress Notes (Deleted)
Pre visit review using our clinic review tool, if applicable. No additional management support is needed unless otherwise documented below in the visit note. 

## 2014-01-29 ENCOUNTER — Other Ambulatory Visit: Payer: Self-pay | Admitting: Internal Medicine

## 2014-02-01 ENCOUNTER — Encounter: Payer: Self-pay | Admitting: Internal Medicine

## 2014-02-01 ENCOUNTER — Ambulatory Visit (INDEPENDENT_AMBULATORY_CARE_PROVIDER_SITE_OTHER): Payer: Managed Care, Other (non HMO) | Admitting: Internal Medicine

## 2014-02-01 VITALS — BP 156/85 | HR 56 | Ht 68.0 in | Wt 170.0 lb

## 2014-02-01 DIAGNOSIS — I10 Essential (primary) hypertension: Secondary | ICD-10-CM

## 2014-02-01 DIAGNOSIS — I480 Paroxysmal atrial fibrillation: Secondary | ICD-10-CM

## 2014-02-01 DIAGNOSIS — G459 Transient cerebral ischemic attack, unspecified: Secondary | ICD-10-CM

## 2014-02-01 DIAGNOSIS — I4891 Unspecified atrial fibrillation: Secondary | ICD-10-CM

## 2014-02-01 MED ORDER — APIXABAN 5 MG PO TABS: 5.0000 mg | ORAL_TABLET | Freq: Two times a day (BID) | ORAL | Status: DC

## 2014-02-01 NOTE — Progress Notes (Signed)
PCP: Walker Kehr, MD  Evan Farmer is a 63 y.o. male who presents today for cardiology followup.  He was previously seen by Dr Doreatha Lew.  He has a h/o paroxysmal atrial fibrillation which has been well controlled for several years with flecainide.  Since last being seen in our clinic, the patient reports doing very well.  He remains active.  Today, he denies symptoms of palpitations, chest pain, shortness of breath,  lower extremity edema, dizziness, presyncope, or syncope.   The patient is otherwise without complaint today.    Past Medical History  Diagnosis Date  . Paroxysmal a-fib   . Weight gain   . HTN (hypertension)   . Hyperlipidemia   . Rocky Mountain spotted fever     age 31  . Scoliosis   . TIA (transient ischemic attack)     2012  . S/P cardiac cath April 2012    No obstructive disease. Normal LV function  . Prostate cancer     2010 Dr Terance Hart   . Vitamin B 12 deficiency   . Depression    Past Surgical History  Procedure Laterality Date  . Prostatectomy  11/2008    Robotic   . Nasal septum surgery    . Hernia repair    . Cataract extraction w/ intraocular lens  implant, bilateral    . Hernia repair  1995 and 2003    twice    Current Outpatient Prescriptions  Medication Sig Dispense Refill  . aspirin 81 MG tablet Take 81 mg by mouth daily.        Marland Kitchen atenolol (TENORMIN) 50 MG tablet TAKE 1 TABLET DAILY. Patient sometimes only take 1/2 tablet.      Marland Kitchen atorvastatin (LIPITOR) 10 MG tablet Take 1 tablet (10 mg total) by mouth daily.  90 tablet  3  . Cyanocobalamin (NASCOBAL) 500 MCG/0.1ML SOLN USE 1 SPRAY IN 1 NOSTRIL   PER WEEK  2.3 mL  0  . flecainide (TAMBOCOR) 100 MG tablet TAKE 1 TABLET TWICE A DAY      . sertraline (ZOLOFT) 100 MG tablet TAKE ONE AND ONE-HALF TABLETS BY MOUTH DAILY  45 tablet  5  . vardenafil (LEVITRA) 20 MG tablet Take 20 mg by mouth daily as needed.        Marland Kitchen VITAMIN D, ERGOCALCIFEROL, PO Patient taking 1 tablet once a day. Strength is unknown.        No current facility-administered medications for this visit.    Physical Exam: Filed Vitals:   02/01/14 0845  BP: 156/85  Pulse: 56  Height: 5\' 8"  (1.727 m)  Weight: 170 lb (77.111 kg)    GEN- The patient is well appearing, alert and oriented x 3 today.   Head- normocephalic, atraumatic Eyes-  Sclera clear, conjunctiva pink Ears- hearing intact Oropharynx- clear Lungs- Clear to ausculation bilaterally, normal work of breathing Heart- Regular rate and rhythm, no murmurs, rubs or gallops, PMI not laterally displaced GI- soft, NT, ND, + BS Extremities- no clubbing, cyanosis, or edema  ekg today reveals sinus rhythm 56 bpm, otherwise normal ekg Echo 2013 reviewed with the patient Lipids from 3/15 reviewed with the patient  Assessment and Plan:  1. afib Well controlled His chads2vasc score is at least 3.  Per guidelines, I have recommended anticoagulation at this time  .Today, I discussed coumadin and novel anticoagulants including pradaxa, xarelto, and eliquis today as indicated for risk reduction in stroke and systemic emboli with nonvalvular atrial fibrillation.  Risks, benefits, and  alternatives to each of these drugs were discussed at length today.  He would prefer eliquis.  I will therefore start eliquis 5mg  BID.  I have specifically instructed him to stop ASA at this time.  2. HTN Above goal He is not willing to adjust medicines at this time If his BP remains elevated, then I would favor an ARB as these medicines have been showed to promote atrial remodeling and may be preventative in progression of his afib long term.  3. HL Stable No change required today  Return to see me in 1 year

## 2014-02-01 NOTE — Patient Instructions (Signed)
Your physician wants you to follow-up in: 12 months with Dr Allred You will receive a reminder letter in the mail two months in advance. If you don't receive a letter, please call our office to schedule the follow-up appointment.  Your physician has recommended you make the following change in your medication:  1) Stop Aspirin 2) Start Eliquis 5 mg twice daily   

## 2014-02-08 ENCOUNTER — Other Ambulatory Visit: Payer: Self-pay | Admitting: Internal Medicine

## 2014-02-09 ENCOUNTER — Other Ambulatory Visit: Payer: Self-pay | Admitting: *Deleted

## 2014-02-09 MED ORDER — FLECAINIDE ACETATE 100 MG PO TABS: 100.0000 mg | ORAL_TABLET | Freq: Two times a day (BID) | ORAL | Status: DC

## 2014-02-19 ENCOUNTER — Encounter: Payer: Self-pay | Admitting: Internal Medicine

## 2014-03-12 ENCOUNTER — Other Ambulatory Visit: Payer: Self-pay | Admitting: Internal Medicine

## 2014-05-21 ENCOUNTER — Telehealth: Payer: Self-pay | Admitting: Internal Medicine

## 2014-05-21 ENCOUNTER — Telehealth: Payer: Self-pay | Admitting: *Deleted

## 2014-05-21 NOTE — Telephone Encounter (Signed)
New message    Patient C/O high blood pressure since he out of town can something be called in.   Today  194/90 @ 1 hour ago.   Heading to emergency room now.

## 2014-05-21 NOTE — Telephone Encounter (Signed)
Left msg on triage requesting something for his BP been running high. Called pt back he stated he was wondering if md could send him some lisinopril out. He is currently out of town in [place]. His BP is 200/something so he ended up going to Er their to be evaluated...[name]

## 2014-05-21 NOTE — Telephone Encounter (Signed)
Called stating he is in [place] and his BP today was 194/90.  He has been in the sun and is anxious.  States he is in ER now waiting for them to give him something for BP. Advised if BP does not resume to better pressure to call us when he gets back in town.

## 2014-05-25 ENCOUNTER — Telehealth: Payer: Self-pay | Admitting: Internal Medicine

## 2014-05-25 NOTE — Telephone Encounter (Signed)
BP 124/72 HR58  Has been good for the most part, except for the one day.  He is going to keep a BP log for the next week, if still up will discuss with Dr Rayann Heman what medication he wants to try.  In his note he mentioned an ARB

## 2014-05-25 NOTE — Telephone Encounter (Signed)
New Message  Pt called, reports his BP was 198/100 over . Pt states that he was seen in the ER in [place] over the weekend. And was given Clonidine. Pt reports It helped for a while, however today his BP spiked up to 160/88. Pt believes that it maybe stress related and that he feels fine however he requests to speak with a nurse. Please call to discuss.

## 2014-05-31 NOTE — Telephone Encounter (Signed)
Discussed with Dr Rayann Heman if BP is still elevated with BP log will start Losartan 25mg  daily.  Patient to call with log

## 2014-06-02 ENCOUNTER — Other Ambulatory Visit: Payer: Self-pay | Admitting: *Deleted

## 2014-06-02 MED ORDER — CYANOCOBALAMIN 500 MCG/0.1ML NA SOLN: NASAL | Status: DC

## 2014-06-18 ENCOUNTER — Other Ambulatory Visit: Payer: Self-pay | Admitting: Internal Medicine

## 2014-06-21 ENCOUNTER — Other Ambulatory Visit: Payer: Self-pay

## 2014-06-21 MED ORDER — ATENOLOL 50 MG PO TABS: ORAL_TABLET | ORAL | Status: DC

## 2014-07-21 ENCOUNTER — Encounter: Payer: Self-pay | Admitting: Internal Medicine

## 2014-07-21 ENCOUNTER — Other Ambulatory Visit (INDEPENDENT_AMBULATORY_CARE_PROVIDER_SITE_OTHER): Payer: Managed Care, Other (non HMO)

## 2014-07-21 ENCOUNTER — Ambulatory Visit (INDEPENDENT_AMBULATORY_CARE_PROVIDER_SITE_OTHER): Payer: Managed Care, Other (non HMO) | Admitting: Internal Medicine

## 2014-07-21 VITALS — BP 162/82 | HR 80 | Temp 98.4°F | Resp 16 | Wt 173.0 lb

## 2014-07-21 DIAGNOSIS — I10 Essential (primary) hypertension: Secondary | ICD-10-CM

## 2014-07-21 DIAGNOSIS — F411 Generalized anxiety disorder: Secondary | ICD-10-CM

## 2014-07-21 DIAGNOSIS — F3289 Other specified depressive episodes: Secondary | ICD-10-CM

## 2014-07-21 DIAGNOSIS — F329 Major depressive disorder, single episode, unspecified: Secondary | ICD-10-CM

## 2014-07-21 DIAGNOSIS — Z23 Encounter for immunization: Secondary | ICD-10-CM

## 2014-07-21 LAB — BASIC METABOLIC PANEL
BUN: 17 mg/dL (ref 6–23)
CO2: 27 mEq/L (ref 19–32)
CREATININE: 1.2 mg/dL (ref 0.4–1.5)
Calcium: 8.5 mg/dL (ref 8.4–10.5)
Chloride: 103 mEq/L (ref 96–112)
GFR: 68.58 mL/min (ref >60.00)
Glucose, Bld: 103 mg/dL — ABNORMAL HIGH (ref 70–99)
Potassium: 4.1 mEq/L (ref 3.5–5.1)
Sodium: 137 mEq/L (ref 135–145)

## 2014-07-21 MED ORDER — ASPIRIN 325 MG PO TABS: 325.0000 mg | ORAL_TABLET | Freq: Every day | ORAL | Status: DC

## 2014-07-21 NOTE — Progress Notes (Signed)
Pre visit review using our clinic review tool, if applicable. No additional management support is needed unless otherwise documented below in the visit note. 

## 2014-07-21 NOTE — Assessment & Plan Note (Signed)
Continue with current Zoloft prescription therapy as reflected on the Med list.  

## 2014-07-21 NOTE — Assessment & Plan Note (Signed)
Chronic Nl BP at home

## 2014-07-21 NOTE — Assessment & Plan Note (Signed)
Continue with current prescription therapy as reflected on the Med list.  

## 2014-07-21 NOTE — Progress Notes (Signed)
Subjective:    The patient has been doing well overall without major physical or psychological issues going on lately.     HPI  His brother Chrissie Noa died in spring 2014   The patient presents for a follow-up of  chronic hypertension, chronic dyslipidemia, depression controlled with medicines. BP OK at home   Review of Systems  Constitutional: Negative for appetite change, fatigue and unexpected weight change.  HENT: Negative for congestion, nosebleeds, sneezing, sore throat and trouble swallowing.   Eyes: Negative for itching and visual disturbance.  Respiratory: Negative for cough.   Cardiovascular: Negative for leg swelling.  Gastrointestinal: Negative for nausea, diarrhea, blood in stool and abdominal distention.  Genitourinary: Negative for frequency and hematuria.  Musculoskeletal: Negative for back pain, gait problem and joint swelling.  Skin: Negative for rash.  Neurological: Negative for dizziness, tremors, speech difficulty and weakness.  Psychiatric/Behavioral: Negative for suicidal ideas, sleep disturbance, dysphoric mood and agitation. The patient is nervous/anxious.        Objective:   Physical Exam  Constitutional: He is oriented to person, place, and time. He appears well-developed.  HENT:  Mouth/Throat: Oropharynx is clear and moist.  Eyes: Conjunctivae are normal. Pupils are equal, round, and reactive to light.  Neck: Normal range of motion. No JVD present. No thyromegaly present.  Cardiovascular: Normal rate, regular rhythm, normal heart sounds and intact distal pulses.  Exam reveals no gallop and no friction rub.   No murmur heard. Pulmonary/Chest: Effort normal and breath sounds normal. No respiratory distress. He has no wheezes. He has no rales. He exhibits no tenderness.  Abdominal: Soft. Bowel sounds are normal. He exhibits no distension and no mass. There is no tenderness. There is no rebound and no guarding.  Musculoskeletal: Normal range of motion.  He exhibits no edema and no tenderness.  Lymphadenopathy:    He has no cervical adenopathy.  Neurological: He is alert and oriented to person, place, and time. He has normal reflexes. No cranial nerve deficit. He exhibits normal muscle tone. Coordination normal.  Skin: Skin is warm and dry. No rash noted.  Psychiatric: He has a normal mood and affect. His behavior is normal. Judgment and thought content normal.       Lab Results  Component Value Date   WBC 5.3 01/19/2014   HGB 16.5 01/19/2014   HCT 48.8 01/19/2014   PLT 184.0 01/19/2014   CHOL 207* 01/19/2014   TRIG 50.0 01/19/2014   HDL 56.40 01/19/2014   ALT 13 01/19/2014   AST 20 01/19/2014   NA 141 01/19/2014   K 4.7 01/19/2014   CL 105 01/19/2014   CREATININE 1.0 01/19/2014   BUN 15 01/19/2014   CO2 32 01/19/2014   TSH 0.75 01/19/2014   PSA 0.00* 01/19/2014   INR 0.95 02/12/2011   HGBA1C  Value: 5.2 (NOTE)                                                                       According to the ADA Clinical Practice Recommendations for 2011, when HbA1c is used as a screening test:   >=6.5%   Diagnostic of Diabetes Mellitus           (if abnormal result  is confirmed)  5.7-6.4%   Increased risk of developing Diabetes Mellitus  References:Diagnosis and Classification of Diabetes Mellitus,Diabetes BRAX,0940,76(KGSUP 1):S62-S69 and Standards of Medical Care in         Diabetes - 2011,Diabetes JSRP,5945,85  (Suppl 1):S11-S61. 01/20/2011      Assessment & Plan:

## 2014-09-09 ENCOUNTER — Other Ambulatory Visit: Payer: Self-pay | Admitting: Internal Medicine

## 2014-09-15 ENCOUNTER — Encounter: Payer: Self-pay | Admitting: Internal Medicine

## 2014-10-31 ENCOUNTER — Encounter (HOSPITAL_COMMUNITY): Payer: Self-pay | Admitting: Nurse Practitioner

## 2014-10-31 ENCOUNTER — Emergency Department (HOSPITAL_COMMUNITY)
Admission: EM | Admit: 2014-10-31 | Discharge: 2014-10-31 | Disposition: A | Discharge status: Home / Self Care | Payer: Managed Care, Other (non HMO) | Attending: Emergency Medicine | Admitting: Emergency Medicine

## 2014-10-31 DIAGNOSIS — Z9889 Other specified postprocedural states: Secondary | ICD-10-CM | POA: Diagnosis not present

## 2014-10-31 DIAGNOSIS — I10 Essential (primary) hypertension: Secondary | ICD-10-CM | POA: Diagnosis present

## 2014-10-31 DIAGNOSIS — F329 Major depressive disorder, single episode, unspecified: Secondary | ICD-10-CM | POA: Insufficient documentation

## 2014-10-31 DIAGNOSIS — E538 Deficiency of other specified B group vitamins: Secondary | ICD-10-CM | POA: Diagnosis not present

## 2014-10-31 DIAGNOSIS — Z7982 Long term (current) use of aspirin: Secondary | ICD-10-CM | POA: Insufficient documentation

## 2014-10-31 DIAGNOSIS — M419 Scoliosis, unspecified: Secondary | ICD-10-CM | POA: Diagnosis not present

## 2014-10-31 DIAGNOSIS — Z8673 Personal history of transient ischemic attack (TIA), and cerebral infarction without residual deficits: Secondary | ICD-10-CM | POA: Diagnosis not present

## 2014-10-31 DIAGNOSIS — I480 Paroxysmal atrial fibrillation: Secondary | ICD-10-CM | POA: Insufficient documentation

## 2014-10-31 DIAGNOSIS — E785 Hyperlipidemia, unspecified: Secondary | ICD-10-CM | POA: Diagnosis not present

## 2014-10-31 DIAGNOSIS — Z8546 Personal history of malignant neoplasm of prostate: Secondary | ICD-10-CM | POA: Diagnosis not present

## 2014-10-31 DIAGNOSIS — Z79899 Other long term (current) drug therapy: Secondary | ICD-10-CM | POA: Diagnosis not present

## 2014-10-31 DIAGNOSIS — Z8619 Personal history of other infectious and parasitic diseases: Secondary | ICD-10-CM | POA: Insufficient documentation

## 2014-10-31 LAB — CBC WITH DIFFERENTIAL/PLATELET
BASOS ABS: 0 10*3/uL (ref 0.0–0.1)
Basophils Relative: 0 % (ref 0–1)
EOS ABS: 0 10*3/uL (ref 0.0–0.7)
Eosinophils Relative: 0 % (ref 0–5)
HCT: 47.7 % (ref 39.0–52.0)
HEMOGLOBIN: 16.5 g/dL (ref 13.0–17.0)
Lymphocytes Relative: 12 % (ref 12–46)
Lymphs Abs: 0.5 10*3/uL — ABNORMAL LOW (ref 0.7–4.0)
MCH: 33.1 pg (ref 26.0–34.0)
MCHC: 34.6 g/dL (ref 30.0–36.0)
MCV: 95.6 fL (ref 78.0–100.0)
MONOS PCT: 5 % (ref 3–12)
Monocytes Absolute: 0.2 10*3/uL (ref 0.1–1.0)
NEUTROS ABS: 3.3 10*3/uL (ref 1.7–7.7)
NEUTROS PCT: 82 % — AB (ref 43–77)
PLATELETS: 157 10*3/uL (ref 150–400)
RBC: 4.99 MIL/uL (ref 4.22–5.81)
RDW: 12.5 % (ref 11.5–15.5)
WBC: 4 10*3/uL (ref 4.0–10.5)

## 2014-10-31 LAB — COMPREHENSIVE METABOLIC PANEL
ALK PHOS: 65 U/L (ref 39–117)
ALT: 13 U/L (ref 0–53)
ANION GAP: 13 (ref 5–15)
AST: 18 U/L (ref 0–37)
Albumin: 3.7 g/dL (ref 3.5–5.2)
BILIRUBIN TOTAL: 0.9 mg/dL (ref 0.3–1.2)
BUN: 12 mg/dL (ref 6–23)
CHLORIDE: 101 meq/L (ref 96–112)
CO2: 25 mEq/L (ref 19–32)
Calcium: 8.6 mg/dL (ref 8.4–10.5)
Creatinine, Ser: 0.98 mg/dL (ref 0.50–1.35)
GFR calc Af Amer: >90 mL/min (ref >90)
GFR calc non Af Amer: 87 mL/min — ABNORMAL LOW (ref >90)
Glucose, Bld: 130 mg/dL — ABNORMAL HIGH (ref 70–99)
Potassium: 3.9 mEq/L (ref 3.7–5.3)
SODIUM: 139 meq/L (ref 137–147)
Total Protein: 6.6 g/dL (ref 6.0–8.3)

## 2014-10-31 MED ORDER — HYDROCHLOROTHIAZIDE 25 MG PO TABS: 25.0000 mg | ORAL_TABLET | Freq: Every day | ORAL | Status: DC

## 2014-10-31 NOTE — ED Notes (Signed)
He checked his blood pressure this morning on his home monitor and it was elevated in the 150s. He is concerned because he takes his BP medication regularly as prescribed and his BP is normally in the 614J systolic. He states he has had decreased appetite this week and some diarrhea. He denies pain.

## 2014-10-31 NOTE — Discharge Instructions (Signed)
Hypertension Hypertension, commonly called high blood pressure, is when the force of blood pumping through your arteries is too strong. Your arteries are the blood vessels that carry blood from your heart throughout your body. A blood pressure reading consists of a higher number over a lower number, such as 110/72. The higher number (systolic) is the pressure inside your arteries when your heart pumps. The lower number (diastolic) is the pressure inside your arteries when your heart relaxes. Ideally you want your blood pressure below 120/80. Hypertension forces your heart to work harder to pump blood. Your arteries may become narrow or stiff. Having hypertension puts you at risk for heart disease, stroke, and other problems.  RISK FACTORS Some risk factors for high blood pressure are controllable. Others are not.  Risk factors you cannot control include:   Race. You may be at higher risk if you are African American.  Age. Risk increases with age.  Gender. Men are at higher risk than women before age 45 years. After age 65, women are at higher risk than men. Risk factors you can control include:  Not getting enough exercise or physical activity.  Being overweight.  Getting too much fat, sugar, calories, or salt in your diet.  Drinking too much alcohol. SIGNS AND SYMPTOMS Hypertension does not usually cause signs or symptoms. Extremely high blood pressure (hypertensive crisis) may cause headache, anxiety, shortness of breath, and nosebleed. DIAGNOSIS  To check if you have hypertension, your health care provider will measure your blood pressure while you are seated, with your arm held at the level of your heart. It should be measured at least twice using the same arm. Certain conditions can cause a difference in blood pressure between your right and left arms. A blood pressure reading that is higher than normal on one occasion does not mean that you need treatment. If one blood pressure reading  is high, ask your health care provider about having it checked again. TREATMENT  Treating high blood pressure includes making lifestyle changes and possibly taking medicine. Living a healthy lifestyle can help lower high blood pressure. You may need to change some of your habits. Lifestyle changes may include:  Following the DASH diet. This diet is high in fruits, vegetables, and whole grains. It is low in salt, red meat, and added sugars.  Getting at least 2 hours of brisk physical activity every week.  Losing weight if necessary.  Not smoking.  Limiting alcoholic beverages.  Learning ways to reduce stress. If lifestyle changes are not enough to get your blood pressure under control, your health care provider may prescribe medicine. You may need to take more than one. Work closely with your health care provider to understand the risks and benefits. HOME CARE INSTRUCTIONS  Have your blood pressure rechecked as directed by your health care provider.   Take medicines only as directed by your health care provider. Follow the directions carefully. Blood pressure medicines must be taken as prescribed. The medicine does not work as well when you skip doses. Skipping doses also puts you at risk for problems.   Do not smoke.   Monitor your blood pressure at home as directed by your health care provider. SEEK MEDICAL CARE IF:   You think you are having a reaction to medicines taken.  You have recurrent headaches or feel dizzy.  You have swelling in your ankles.  You have trouble with your vision. SEEK IMMEDIATE MEDICAL CARE IF:  You develop a severe headache or confusion.    You have unusual weakness, numbness, or feel faint.  You have severe chest or abdominal pain.  You vomit repeatedly.  You have trouble breathing. MAKE SURE YOU:   Understand these instructions.  Will watch your condition.  Will get help right away if you are not doing well or get worse. Document  Released: 10/29/2005 Document Revised: 03/15/2014 Document Reviewed: 08/21/2013 ExitCare Patient Information 2015 [employer]. This information is not intended to replace advice given to you by your health care provider. Make sure you discuss any questions you have with your health care provider. DASH Eating Plan DASH stands for "Dietary Approaches to Stop Hypertension." The DASH eating plan is a healthy eating plan that has been shown to reduce high blood pressure (hypertension). Additional health benefits may include reducing the risk of type 2 diabetes mellitus, heart disease, and stroke. The DASH eating plan may also help with weight loss. WHAT DO I NEED TO KNOW ABOUT THE DASH EATING PLAN? For the DASH eating plan, you will follow these general guidelines:  Choose foods with a percent daily value for sodium of less than 5% (as listed on the food label).  Use salt-free seasonings or herbs instead of table salt or sea salt.  Check with your health care provider or pharmacist before using salt substitutes.  Eat lower-sodium products, often labeled as "lower sodium" or "no salt added."  Eat fresh foods.  Eat more vegetables, fruits, and low-fat dairy products.  Choose whole grains. Look for the word "whole" as the first word in the ingredient list.  Choose fish and skinless chicken or turkey more often than red meat. Limit fish, poultry, and meat to 6 oz (170 g) each day.  Limit sweets, desserts, sugars, and sugary drinks.  Choose heart-healthy fats.  Limit cheese to 1 oz (28 g) per day.  Eat more home-cooked food and less restaurant, buffet, and fast food.  Limit fried foods.  Cook foods using methods other than frying.  Limit canned vegetables. If you do use them, rinse them well to decrease the sodium.  When eating at a restaurant, ask that your food be prepared with less salt, or no salt if possible. WHAT FOODS CAN I EAT? Seek help from a dietitian for individual  calorie needs. Grains Whole grain or whole wheat bread. Brown rice. Whole grain or whole wheat pasta. Quinoa, bulgur, and whole grain cereals. Low-sodium cereals. Corn or whole wheat flour tortillas. Whole grain cornbread. Whole grain crackers. Low-sodium crackers. Vegetables Fresh or frozen vegetables (raw, steamed, roasted, or grilled). Low-sodium or reduced-sodium tomato and vegetable juices. Low-sodium or reduced-sodium tomato sauce and paste. Low-sodium or reduced-sodium canned vegetables.  Fruits All fresh, canned (in natural juice), or frozen fruits. Meat and Other Protein Products Ground beef (85% or leaner), grass-fed beef, or beef trimmed of fat. Skinless chicken or turkey. Ground chicken or turkey. Pork trimmed of fat. All fish and seafood. Eggs. Dried beans, peas, or lentils. Unsalted nuts and seeds. Unsalted canned beans. Dairy Low-fat dairy products, such as skim or 1% milk, 2% or reduced-fat cheeses, low-fat ricotta or cottage cheese, or plain low-fat yogurt. Low-sodium or reduced-sodium cheeses. Fats and Oils Tub margarines without trans fats. Light or reduced-fat mayonnaise and salad dressings (reduced sodium). Avocado. Safflower, olive, or canola oils. Natural peanut or almond butter. Other Unsalted popcorn and pretzels. The items listed above may not be a complete list of recommended foods or beverages. Contact your dietitian for more options. WHAT FOODS ARE NOT RECOMMENDED? Grains White bread.   White pasta. White rice. Refined cornbread. Bagels and croissants. Crackers that contain trans fat. Vegetables Creamed or fried vegetables. Vegetables in a cheese sauce. Regular canned vegetables. Regular canned tomato sauce and paste. Regular tomato and vegetable juices. Fruits Dried fruits. Canned fruit in light or heavy syrup. Fruit juice. Meat and Other Protein Products Fatty cuts of meat. Ribs, chicken wings, bacon, sausage, bologna, salami, chitterlings, fatback, hot dogs,  bratwurst, and packaged luncheon meats. Salted nuts and seeds. Canned beans with salt. Dairy Whole or 2% milk, cream, half-and-half, and cream cheese. Whole-fat or sweetened yogurt. Full-fat cheeses or blue cheese. Nondairy creamers and whipped toppings. Processed cheese, cheese spreads, or cheese curds. Condiments Onion and garlic salt, seasoned salt, table salt, and sea salt. Canned and packaged gravies. Worcestershire sauce. Tartar sauce. Barbecue sauce. Teriyaki sauce. Soy sauce, including reduced sodium. Steak sauce. Fish sauce. Oyster sauce. Cocktail sauce. Horseradish. Ketchup and mustard. Meat flavorings and tenderizers. Bouillon cubes. Hot sauce. Tabasco sauce. Marinades. Taco seasonings. Relishes. Fats and Oils Butter, stick margarine, lard, shortening, ghee, and bacon fat. Coconut, palm kernel, or palm oils. Regular salad dressings. Other Pickles and olives. Salted popcorn and pretzels. The items listed above may not be a complete list of foods and beverages to avoid. Contact your dietitian for more information. WHERE CAN I FIND MORE INFORMATION? National Heart, Lung, and Blood Institute: www.nhlbi.nih.gov/health/health-topics/topics/dash/ Document Released: 10/18/2011 Document Revised: 03/15/2014 Document Reviewed: 09/02/2013 ExitCare Patient Information 2015 [employer]. This information is not intended to replace advice given to you by your health care provider. Make sure you discuss any questions you have with your health care provider.  

## 2014-10-31 NOTE — ED Provider Notes (Signed)
CSN: [id number]     Arrival date & time 10/31/14  1114 History   First MD Initiated Contact with Patient 10/31/14 1121     Chief Complaint  Patient presents with  . Hypertension     (Consider location/radiation/quality/duration/timing/severity/associated sxs/prior Treatment) Patient is a 63 y.o. male presenting with hypertension. The history is provided by the patient. No language interpreter was used.  Hypertension This is a new problem. The current episode started today. The problem occurs constantly. The problem has been gradually worsening. Pertinent negatives include no nausea. Nothing aggravates the symptoms. He has tried nothing for the symptoms. The treatment provided moderate relief.  Pt complains   Past Medical History  Diagnosis Date  . Paroxysmal a-fib   . Weight gain   . HTN (hypertension)   . Hyperlipidemia   . [place] spotted fever     age 45  . Scoliosis   . TIA (transient ischemic attack)     2012  . S/P cardiac cath April 2012    No obstructive disease. Normal LV function  . Prostate cancer     2010 Dr Terance Hart   . Vitamin B 12 deficiency   . Depression    Past Surgical History  Procedure Laterality Date  . Prostatectomy  11/2008    Robotic   . Nasal septum surgery    . Hernia repair    . Cataract extraction w/ intraocular lens  implant, bilateral    . Hernia repair  1995 and 2003    twice   Family History  Problem Relation Age of Onset  . Arrhythmia Mother     A-FIB  . Cancer Other     lung and prostate  . Cancer Father    History  Substance Use Topics  . Smoking status: Never Smoker   . Smokeless tobacco: Never Used  . Alcohol Use: 4.8 oz/week    4 Glasses of wine, 4 Shots of liquor per week     Comment: Social alcohol use    Review of Systems  Gastrointestinal: Negative for nausea.  All other systems reviewed and are negative.     Allergies  Procainamide hcl and Quinidine  Home Medications   Prior to Admission  medications   Medication Sig Start Date End Date Taking? Authorizing Provider  aspirin (BAYER ASPIRIN) 325 MG tablet Take 1 tablet (325 mg total) by mouth daily. 07/21/14  Yes Aleksei Plotnikov V, MD  atenolol (TENORMIN) 50 MG tablet Take 50 mg by mouth daily.   Yes Historical Provider, MD  atorvastatin (LIPITOR) 10 MG tablet Take 10 mg by mouth 3 (three) times a week.   Yes Historical Provider, MD  Cyanocobalamin (B-12 PO) Place 1 tablet under the tongue daily.   Yes Historical Provider, MD  Cyanocobalamin (NASCOBAL) 500 MCG/0.1ML SOLN Place 500 mcg into the nose once a week.   Yes Historical Provider, MD  flecainide (TAMBOCOR) 100 MG tablet Take 1 tablet (100 mg total) by mouth 2 (two) times daily. 02/09/14  Yes Thompson Grayer, MD  sertraline (ZOLOFT) 100 MG tablet Take 150 mg by mouth daily.   Yes Historical Provider, MD  vardenafil (LEVITRA) 20 MG tablet Take 20 mg by mouth daily as needed for erectile dysfunction.    Yes Historical Provider, MD  VITAMIN D, ERGOCALCIFEROL, PO Patient taking 1 tablet once a day. Strength is unknown.   Yes Historical Provider, MD  atenolol (TENORMIN) 50 MG tablet TAKE 1 TABLET DAILY. KEEP  APPOINTMENT FOR FURTHER    REFILLS  ON 02/01/14 06/21/14   Thompson Grayer, MD  atenolol (TENORMIN) 50 MG tablet TAKE 1 TABLET DAILY 09/10/14   Thompson Grayer, MD  Cyanocobalamin (NASCOBAL) 500 MCG/0.1ML SOLN USE 1 SPRAY IN 1 NOSTRIL   PER WEEK 06/02/14   Aleksei Plotnikov V, MD  sertraline (ZOLOFT) 100 MG tablet take 1 and 1/2 tablets by mouth once daily 03/12/14   Aleksei Plotnikov V, MD   BP 179/88 mmHg  Pulse 66  Temp(Src) 98.2 F (36.8 C) (Oral)  Resp 16  Ht 5\' 9"  (1.753 m)  Wt 175 lb (79.379 kg)  BMI 25.83 kg/m2  SpO2 98% Physical Exam  Constitutional: He is oriented to person, place, and time. He appears well-developed and well-nourished.  HENT:  Head: Normocephalic and atraumatic.  Right Ear: External ear normal.  Left Ear: External ear normal.  Nose: Nose normal.   Mouth/Throat: Oropharynx is clear and moist.  Eyes: EOM are normal. Pupils are equal, round, and reactive to light.  Neck: Normal range of motion.  Cardiovascular: Normal rate, regular rhythm and normal heart sounds.   Pulmonary/Chest: Effort normal.  Abdominal: Soft. He exhibits no distension.  Musculoskeletal: Normal range of motion.  Neurological: He is alert and oriented to person, place, and time.  Skin: Skin is warm.  Psychiatric: He has a normal mood and affect.  Nursing note and vitals reviewed.   ED Course  Procedures (including critical care time) Labs Review Labs Reviewed  CBC WITH DIFFERENTIAL - Abnormal; Notable for the following:    Neutrophils Relative % 82 (*)    Lymphs Abs 0.5 (*)    All other components within normal limits  COMPREHENSIVE METABOLIC PANEL - Abnormal; Notable for the following:    Glucose, Bld 130 (*)    GFR calc non Af Amer 87 (*)    All other components within normal limits    Imaging Review No results found.   EKG Interpretation   Date/Time:  Sunday October 31 2014 12:13:01 EST Ventricular Rate:  60 PR Interval:  165 QRS Duration: 117 QT Interval:  426 QTC Calculation: 426 R Axis:   51 Text Interpretation:  Sinus rhythm Consider left atrial enlargement  Nonspecific intraventricular conduction delay Baseline wander in lead(s)  V1 V2 Confirmed by Jeneen Rinks  MD, [place] ([id number]) on 10/31/2014 12:18:55 PM      MDM  Labs normal,  EKg unchanged.   I will add HCtz 25 mg to pt's treatment.   Pt advised to see his MD this week for recheck.     Final diagnoses:  Essential hypertension    hctz     Fransico Meadow, PA-C 10/31/14 [place], MD 11/01/14 [phone]

## 2014-11-08 ENCOUNTER — Ambulatory Visit (AMBULATORY_SURGERY_CENTER): Payer: Self-pay | Admitting: *Deleted

## 2014-11-08 VITALS — Ht 69.0 in | Wt 176.0 lb

## 2014-11-08 DIAGNOSIS — Z1211 Encounter for screening for malignant neoplasm of colon: Secondary | ICD-10-CM

## 2014-11-08 MED ORDER — MOVIPREP 100 G PO SOLR: 1.0000 | Freq: Once | ORAL | Status: DC

## 2014-11-08 NOTE — Progress Notes (Signed)
NO ISSUES WITH PAST SEDATION. EWM NO EGG OR SOY ALLERGY. EWM WOKE UP IN THE MIDDLE OF LAST COLON. EWM NO HOME 02 USE. EWM NO DIET PILLS. EWM HX A FIB CONTROLLED ON FLECANIDE. PT STATES HR RUNS IN THE 50'S. EWM ED LAST WEEK FOR HIGH BP, NAUSEA AND DIARRHEA. ED STATES STRESS RELATED HTN, INCREASE SALT IN DIET ALSO, DC'D WITH HCTZ BUT NO NEW ISSUES. EWM

## 2014-11-15 ENCOUNTER — Ambulatory Visit (AMBULATORY_SURGERY_CENTER): Payer: 59 | Admitting: Internal Medicine

## 2014-11-15 ENCOUNTER — Encounter: Payer: Self-pay | Admitting: Internal Medicine

## 2014-11-15 VITALS — BP 145/79 | HR 54 | Temp 97.1°F | Resp 16 | Ht 69.0 in | Wt 176.0 lb

## 2014-11-15 DIAGNOSIS — Z1211 Encounter for screening for malignant neoplasm of colon: Secondary | ICD-10-CM

## 2014-11-15 DIAGNOSIS — D122 Benign neoplasm of ascending colon: Secondary | ICD-10-CM

## 2014-11-15 DIAGNOSIS — D123 Benign neoplasm of transverse colon: Secondary | ICD-10-CM

## 2014-11-15 MED ORDER — SODIUM CHLORIDE 0.9 % IV SOLN: 500.0000 mL | INTRAVENOUS | Status: DC

## 2014-11-15 NOTE — Progress Notes (Signed)
Procedure ends, to recovery, report given and VSS. 

## 2014-11-15 NOTE — Progress Notes (Signed)
Called to room to assist during endoscopic procedure.  Patient ID and intended procedure confirmed with present staff. Received instructions for my participation in the procedure from the performing physician.  

## 2014-11-15 NOTE — Op Note (Signed)
[facility]  [address]. [place], [address]   COLONOSCOPY PROCEDURE REPORT  PATIENT: Evan Farmer, Evan Farmer  MR#: [id number] BIRTHDATE: 12/25/1950 , 63  yrs. old GENDER: male ENDOSCOPIST: Jerene Bears, MD PROCEDURE DATE:  11/15/2014 PROCEDURE:   Colonoscopy with snare polypectomy First Screening Colonoscopy - Avg.  risk and is 50 yrs.  old or older - No.  Prior Negative Screening - Now for repeat screening. 10 or more years since last screening  History of Adenoma - Now for follow-up colonoscopy & has been > or = to 3 yrs.  N/A  Polyps Removed Today? Yes. ASA CLASS:   Class II INDICATIONS:average risk for colorectal cancer and last colonoscopy completed  years ago. MEDICATIONS: Monitored anesthesia care and Propofol 270 mg IV  DESCRIPTION OF PROCEDURE:   After the risks benefits and alternatives of the procedure were thoroughly explained, informed consent was obtained.  The digital rectal exam revealed no rectal mass.   The LB [id number]  endoscope was introduced through the anus and advanced to the cecum, which was identified by both the appendix and ileocecal valve. No adverse events experienced. The quality of the prep was good, using MoviPrep  The instrument was then slowly withdrawn as the colon was fully examined.  COLON FINDINGS: Three polyps 1 flat, 1 sessile and 1 semi-pedunculated ranging from 5 to 21mm in size were found in the transverse colon (2) and ascending colon (1).  Polypectomies were performed using snare cautery (1) and with a cold snare (2).  The resection was complete, the polyp tissue was completely retrieved and sent to histology.   There was moderate diverticulosis noted in the sigmoid colon and descending colon.  Retroflexed views revealed internal hemorrhoids. The time to cecum=2 minutes 57 seconds. Withdrawal time=11 minutes 07 seconds.  The scope was withdrawn and the procedure completed. COMPLICATIONS: There were no immediate  complications.  ENDOSCOPIC IMPRESSION: 1.   Three sessile polyps ranging from 5 to 87mm in size were found in the transverse colon and ascending colon; polypectomies were performed using snare cautery and with a cold snare 2.   Moderate diverticulosis was noted in the sigmoid colon and descending colon  RECOMMENDATIONS: 1.  Await pathology results 2.  High fiber diet 3.  If the polyps removed today are proven to be adenomatous (pre-cancerous) polyps, you will need a colonoscopy in 3 years. Otherwise you should continue to follow colorectal cancer screening guidelines for "routine risk" patients with a colonoscopy in 10 years.  You will receive a letter within 1-2 weeks with the results of your biopsy as well as final recommendations.  Please call my office if you have not received a letter after 3 weeks.  eSigned:  Jerene Bears, MD 11/15/2014 8:50 AM  cc: Altamese Cashiers.  Plotnikov, MD and The Patient

## 2014-11-15 NOTE — Patient Instructions (Signed)
Handouts given for polyps,diverticulosis and high fiber.   YOU HAD AN ENDOSCOPIC PROCEDURE TODAY AT [facility]: Refer to the procedure report that was given to you for any specific questions about what was found during the examination.  If the procedure report does not answer your questions, please call your gastroenterologist to clarify.  If you requested that your care partner not be given the details of your procedure findings, then the procedure report has been included in a sealed envelope for you to review at your convenience later.  YOU SHOULD EXPECT: Some feelings of bloating in the abdomen. Passage of more gas than usual.  Walking can help get rid of the air that was put into your GI tract during the procedure and reduce the bloating. If you had a lower endoscopy (such as a colonoscopy or flexible sigmoidoscopy) you may notice spotting of blood in your stool or on the toilet paper. If you underwent a bowel prep for your procedure, then you may not have a normal bowel movement for a few days.  DIET: Your first meal following the procedure should be a light meal and then it is ok to progress to your normal diet.  A half-sandwich or bowl of soup is an example of a good first meal.  Heavy or fried foods are harder to digest and may make you feel nauseous or bloated.  Likewise meals heavy in dairy and vegetables can cause extra gas to form and this can also increase the bloating.  Drink plenty of fluids but you should avoid alcoholic beverages for 24 hours.  ACTIVITY: Your care partner should take you home directly after the procedure.  You should plan to take it easy, moving slowly for the rest of the day.  You can resume normal activity the day after the procedure however you should NOT DRIVE or use heavy machinery for 24 hours (because of the sedation medicines used during the test).    SYMPTOMS TO REPORT IMMEDIATELY: A gastroenterologist can be reached at any hour.  During  normal business hours, 8:30 AM to 5:00 PM Monday through Friday, call [phone].  After hours and on weekends, please call the GI answering service at [phone] who will take a message and have the physician on call contact you.   Following lower endoscopy (colonoscopy or flexible sigmoidoscopy):  Excessive amounts of blood in the stool  Significant tenderness or worsening of abdominal pains  Swelling of the abdomen that is new, acute  Fever of 100F or higher FOLLOW UP: If any biopsies were taken you will be contacted by phone or by letter within the next 1-3 weeks.  Call your gastroenterologist if you have not heard about the biopsies in 3 weeks.  Our staff will call the home number listed on your records the next business day following your procedure to check on you and address any questions or concerns that you may have at that time regarding the information given to you following your procedure. This is a courtesy call and so if there is no answer at the home number and we have not heard from you through the emergency physician on call, we will assume that you have returned to your regular daily activities without incident.  SIGNATURES/CONFIDENTIALITY: You and/or your care partner have signed paperwork which will be entered into your electronic medical record.  These signatures attest to the fact that that the information above on your After Visit Summary has been reviewed and is understood.  Full responsibility of the confidentiality of this discharge information lies with you and/or your care-partner.

## 2014-11-16 ENCOUNTER — Telehealth: Payer: Self-pay | Admitting: *Deleted

## 2014-11-16 NOTE — Telephone Encounter (Signed)
Left message that we called for f/u 

## 2014-11-17 ENCOUNTER — Encounter: Payer: Self-pay | Admitting: Internal Medicine

## 2014-11-17 ENCOUNTER — Ambulatory Visit (INDEPENDENT_AMBULATORY_CARE_PROVIDER_SITE_OTHER): Payer: 59 | Admitting: Internal Medicine

## 2014-11-17 VITALS — BP 160/90 | HR 61 | Temp 98.2°F | Wt 173.0 lb

## 2014-11-17 DIAGNOSIS — F411 Generalized anxiety disorder: Secondary | ICD-10-CM

## 2014-11-17 DIAGNOSIS — I10 Essential (primary) hypertension: Secondary | ICD-10-CM

## 2014-11-17 MED ORDER — HYDROCHLOROTHIAZIDE 25 MG PO TABS: 25.0000 mg | ORAL_TABLET | Freq: Every day | ORAL | Status: DC

## 2014-11-17 MED ORDER — ALPRAZOLAM 0.25 MG PO TABS: 0.2500 mg | ORAL_TABLET | Freq: Two times a day (BID) | ORAL | Status: DC | PRN

## 2014-11-17 NOTE — Assessment & Plan Note (Signed)
Xanax prn  Potential benefits of a  prn benzodiazepines  use as well as potential risks  and complications were explained to the patient and were aknowledged.

## 2014-11-17 NOTE — Assessment & Plan Note (Signed)
HCTZ was added NAS diet

## 2014-11-17 NOTE — Progress Notes (Signed)
F/u ER visit 2 wks ago for high BP  Subjective:        HPI  C/o BP elevation, BP re-checked 150/90  His brother Chrissie Noa died in spring 2014   The patient presents for a follow-up of  chronic hypertension, chronic dyslipidemia, depression controlled with medicines. BP OK at home: SBP130-150 at home  BP Readings from Last 3 Encounters:  11/17/14 188/90  11/15/14 145/79  10/31/14 138/79   Wt Readings from Last 3 Encounters:  11/17/14 173 lb (78.472 kg)  11/15/14 176 lb (79.833 kg)  11/08/14 176 lb (79.833 kg)     Review of Systems  Constitutional: Negative for appetite change, fatigue and unexpected weight change.  HENT: Negative for congestion, nosebleeds, sneezing, sore throat and trouble swallowing.   Eyes: Negative for itching and visual disturbance.  Respiratory: Negative for cough.   Cardiovascular: Negative for leg swelling.  Gastrointestinal: Negative for nausea, diarrhea, blood in stool and abdominal distention.  Genitourinary: Negative for frequency and hematuria.  Musculoskeletal: Negative for back pain, joint swelling and gait problem.  Skin: Negative for rash.  Neurological: Negative for dizziness, tremors, speech difficulty and weakness.  Psychiatric/Behavioral: Negative for suicidal ideas, sleep disturbance, dysphoric mood and agitation. The patient is nervous/anxious.        Objective:   Physical Exam  Constitutional: He is oriented to person, place, and time. He appears well-developed. No distress.  NAD  HENT:  Mouth/Throat: Oropharynx is clear and moist.  Eyes: Conjunctivae are normal. Pupils are equal, round, and reactive to light.  Neck: Normal range of motion. No JVD present. No thyromegaly present.  Cardiovascular: Normal rate, regular rhythm, normal heart sounds and intact distal pulses.  Exam reveals no gallop and no friction rub.   No murmur heard. Pulmonary/Chest: Effort normal and breath sounds normal. No respiratory distress. He has no  wheezes. He has no rales. He exhibits no tenderness.  Abdominal: Soft. Bowel sounds are normal. He exhibits no distension and no mass. There is no tenderness. There is no rebound and no guarding.  Musculoskeletal: Normal range of motion. He exhibits no edema or tenderness.  Lymphadenopathy:    He has no cervical adenopathy.  Neurological: He is alert and oriented to person, place, and time. He has normal reflexes. No cranial nerve deficit. He exhibits normal muscle tone. He displays a negative Romberg sign. Coordination and gait normal.  No meningeal signs  Skin: Skin is warm and dry. No rash noted.  Psychiatric: He has a normal mood and affect. His behavior is normal. Judgment and thought content normal.       Lab Results  Component Value Date   WBC 4.0 10/31/2014   HGB 16.5 10/31/2014   HCT 47.7 10/31/2014   PLT 157 10/31/2014   CHOL 207* 01/19/2014   TRIG 50.0 01/19/2014   HDL 56.40 01/19/2014   ALT 13 10/31/2014   AST 18 10/31/2014   NA 139 10/31/2014   K 3.9 10/31/2014   CL 101 10/31/2014   CREATININE 0.98 10/31/2014   BUN 12 10/31/2014   CO2 25 10/31/2014   TSH 0.75 01/19/2014   PSA 0.00* 01/19/2014   INR 0.95 02/12/2011   HGBA1C  01/20/2011    5.2 (NOTE)  According to the ADA Clinical Practice Recommendations for 2011, when HbA1c is used as a screening test:   >=6.5%   Diagnostic of Diabetes Mellitus           (if abnormal result  is confirmed)  5.7-6.4%   Increased risk of developing Diabetes Mellitus  References:Diagnosis and Classification of Diabetes Mellitus,Diabetes ZJQB,3419,37(TKWIO 1):S62-S69 and Standards of Medical Care in         Diabetes - 2011,Diabetes XBDZ,3299,24  (Suppl 1):S11-S61.      Assessment & Plan:

## 2014-11-17 NOTE — Progress Notes (Signed)
Pre visit review using our clinic review tool, if applicable. No additional management support is needed unless otherwise documented below in the visit note. 

## 2014-11-24 ENCOUNTER — Encounter: Payer: Self-pay | Admitting: Internal Medicine

## 2014-11-24 ENCOUNTER — Telehealth: Payer: Self-pay | Admitting: Internal Medicine

## 2014-11-24 NOTE — Telephone Encounter (Signed)
emmi emailed °

## 2014-11-26 ENCOUNTER — Telehealth: Payer: Self-pay | Admitting: Internal Medicine

## 2014-11-26 NOTE — Telephone Encounter (Signed)
LVM for pt to return call

## 2014-11-26 NOTE — Telephone Encounter (Signed)
Is requesting call back in regards to allergic reaction to hydrochlorothiazide

## 2014-12-10 ENCOUNTER — Other Ambulatory Visit: Payer: Self-pay | Admitting: Internal Medicine

## 2015-01-19 ENCOUNTER — Ambulatory Visit: Payer: Managed Care, Other (non HMO) | Admitting: Internal Medicine

## 2015-01-27 ENCOUNTER — Other Ambulatory Visit: Payer: Self-pay | Admitting: Internal Medicine

## 2015-01-30 ENCOUNTER — Emergency Department (HOSPITAL_COMMUNITY)
Admission: EM | Admit: 2015-01-30 | Discharge: 2015-01-30 | Disposition: A | Discharge status: Home / Self Care | Payer: 59 | Attending: Emergency Medicine | Admitting: Emergency Medicine

## 2015-01-30 ENCOUNTER — Encounter (HOSPITAL_COMMUNITY): Payer: Self-pay | Admitting: Emergency Medicine

## 2015-01-30 DIAGNOSIS — E785 Hyperlipidemia, unspecified: Secondary | ICD-10-CM | POA: Diagnosis not present

## 2015-01-30 DIAGNOSIS — Z7982 Long term (current) use of aspirin: Secondary | ICD-10-CM | POA: Diagnosis not present

## 2015-01-30 DIAGNOSIS — M419 Scoliosis, unspecified: Secondary | ICD-10-CM | POA: Insufficient documentation

## 2015-01-30 DIAGNOSIS — Z8546 Personal history of malignant neoplasm of prostate: Secondary | ICD-10-CM | POA: Diagnosis not present

## 2015-01-30 DIAGNOSIS — I480 Paroxysmal atrial fibrillation: Secondary | ICD-10-CM | POA: Insufficient documentation

## 2015-01-30 DIAGNOSIS — Z8673 Personal history of transient ischemic attack (TIA), and cerebral infarction without residual deficits: Secondary | ICD-10-CM | POA: Diagnosis not present

## 2015-01-30 DIAGNOSIS — E538 Deficiency of other specified B group vitamins: Secondary | ICD-10-CM | POA: Diagnosis not present

## 2015-01-30 DIAGNOSIS — Z79899 Other long term (current) drug therapy: Secondary | ICD-10-CM | POA: Diagnosis not present

## 2015-01-30 DIAGNOSIS — I10 Essential (primary) hypertension: Secondary | ICD-10-CM | POA: Diagnosis not present

## 2015-01-30 DIAGNOSIS — Z9889 Other specified postprocedural states: Secondary | ICD-10-CM | POA: Insufficient documentation

## 2015-01-30 DIAGNOSIS — Z8619 Personal history of other infectious and parasitic diseases: Secondary | ICD-10-CM | POA: Diagnosis not present

## 2015-01-30 DIAGNOSIS — F329 Major depressive disorder, single episode, unspecified: Secondary | ICD-10-CM | POA: Insufficient documentation

## 2015-01-30 NOTE — ED Provider Notes (Signed)
CSN: [id number]     Arrival date & time 01/30/15  1228 History   First MD Initiated Contact with Patient 01/30/15 1523     Chief Complaint  Patient presents with  . Hypertension     (Consider location/radiation/quality/duration/timing/severity/associated sxs/prior Treatment) HPI Comments: Patient with past medical history remarkable for hypertension and A. fib resents emergency department with chief complaint of hypertension. He states that today he noticed that his blood pressure was running high. He states that he was in the 335K systolic. Patient states that he did take his blood pressure medicine this morning. States that he then went to church and when he returned home his systolic was 562. Patient states that he became concerned this time, and came to the emergency department. He states that he felt slightly dizzy this morning, but currently denies any complaints. Specifically, denies any dizziness, chest pain, shortness of breath, or other symptoms. Patient states that he has follow-up with his primary care doctor on Tuesday and follow-up with his cardiologist in 2 weeks. Patient states that he also suffers from anxiety, and thinks that this may have contributed to his blood pressure being elevated. He states that since he has been in the emergency department he has felt well, and has calmed down significantly.  The history is provided by the patient. No language interpreter was used.    Past Medical History  Diagnosis Date  . Paroxysmal a-fib   . Weight gain   . HTN (hypertension)   . Hyperlipidemia   . [place] spotted fever     age 44  . Scoliosis   . TIA (transient ischemic attack)     2012  . S/P cardiac cath April 2012    No obstructive disease. Normal LV function  . Prostate cancer     2010 Dr Terance Hart   . Vitamin B 12 deficiency   . Depression    Past Surgical History  Procedure Laterality Date  . Prostatectomy  11/2008    Robotic   . Nasal septum surgery    .  Hernia repair    . Cataract extraction w/ intraocular lens  implant, bilateral    . Hernia repair  1995 and 2003    twice  . Colonoscopy  01-24-2005    T[facility] ONLY    Family History  Problem Relation Age of Onset  . Arrhythmia Mother     A-FIB  . Cancer Other     lung and prostate  . Cancer Father   . Colon cancer Paternal Uncle   . Rectal cancer Neg Hx   . Stomach cancer Neg Hx    History  Substance Use Topics  . Smoking status: Never Smoker   . Smokeless tobacco: Never Used  . Alcohol Use: 4.8 oz/week    4 Glasses of wine, 4 Shots of liquor per week     Comment: Social alcohol use    Review of Systems  Constitutional: Negative for fever and chills.  Respiratory: Negative for shortness of breath.   Cardiovascular: Negative for chest pain.  Gastrointestinal: Negative for nausea, vomiting, diarrhea and constipation.  Genitourinary: Negative for dysuria.  All other systems reviewed and are negative.     Allergies  Procainamide hcl and Quinidine  Home Medications   Prior to Admission medications   Medication Sig Start Date End Date Taking? Authorizing Provider  ALPRAZolam (XANAX) 0.25 MG tablet Take 1 tablet (0.25 mg total) by mouth 2 (two) times daily as needed for anxiety. 11/17/14  Yes  Aleksei Plotnikov V, MD  aspirin (BAYER ASPIRIN) 325 MG tablet Take 1 tablet (325 mg total) by mouth daily. 07/21/14  Yes Aleksei Plotnikov V, MD  atenolol (TENORMIN) 50 MG tablet TAKE 1 TABLET DAILY 09/10/14  Yes Thompson Grayer, MD  atorvastatin (LIPITOR) 10 MG tablet TAKE 1 TABLET DAILY Patient taking differently: TAKE 1 TABLET BY MOUTH TWO TO THREE TIMES WEEK WITH SUPPER 12/10/14  Yes Aleksei Plotnikov V, MD  Cholecalciferol (VITAMIN D3) 5000 UNITS TABS Take 5,000 Units by mouth daily.   Yes Historical Provider, MD  Cyanocobalamin (NASCOBAL) 500 MCG/0.1ML SOLN USE 1 SPRAY IN 1 NOSTRIL   PER WEEK Patient taking differently: Inhale 500 mcg into the lungs once a week. On Sundays (use either  spray or tablet) 06/02/14  Yes Aleksei Plotnikov V, MD  Cyanocobalamin (VITAMIN B-12 SL) Place 1 tablet under the tongue daily. Use either spray or tablets   Yes Historical Provider, MD  flecainide (TAMBOCOR) 100 MG tablet Take 1 tablet (100 mg total) by mouth 2 (two) times daily. 02/09/14  Yes Thompson Grayer, MD  hydrochlorothiazide (HYDRODIURIL) 25 MG tablet Take 1 tablet (25 mg total) by mouth daily. 11/17/14  Yes Aleksei Plotnikov V, MD  ibuprofen (ADVIL,MOTRIN) 200 MG tablet Take 200-400 mg by mouth 2 (two) times daily as needed (pain).   Yes Historical Provider, MD  sertraline (ZOLOFT) 100 MG tablet take 1 and 1/2 tablets by mouth once daily Patient taking differently: take 1 tablet by mouth once daily as needed for anxiety 03/12/14  Yes Aleksei Plotnikov V, MD  vardenafil (LEVITRA) 20 MG tablet Take 20 mg by mouth daily as needed for erectile dysfunction.    Yes Historical Provider, MD  flecainide (TAMBOCOR) 100 MG tablet TAKE 1 TABLET BY MOUTH TWICE DAILY. PLEASE MAKE APPOINTMENT FOR FURTHER REFILLS Patient not taking: Reported on 01/30/2015 01/27/15   Lelon Perla, MD   BP 165/89 mmHg  Pulse 61  Temp(Src) 97.7 F (36.5 C) (Oral)  Resp 15  Ht 5\' 9"  (1.753 m)  Wt 174 lb (78.926 kg)  BMI 25.68 kg/m2  SpO2 98% Physical Exam  Constitutional: He is oriented to person, place, and time. He appears well-developed and well-nourished.  HENT:  Head: Normocephalic and atraumatic.  Eyes: Conjunctivae and EOM are normal. Pupils are equal, round, and reactive to light. Right eye exhibits no discharge. Left eye exhibits no discharge. No scleral icterus.  Neck: Normal range of motion. Neck supple. No JVD present.  Cardiovascular: Normal rate, regular rhythm and normal heart sounds.  Exam reveals no gallop and no friction rub.   No murmur heard. Pulmonary/Chest: Effort normal and breath sounds normal. No respiratory distress. He has no wheezes. He has no rales. He exhibits no tenderness.  Abdominal:  Soft. He exhibits no distension and no mass. There is no tenderness. There is no rebound and no guarding.  Musculoskeletal: Normal range of motion. He exhibits no edema or tenderness.  Neurological: He is alert and oriented to person, place, and time.  Skin: Skin is warm and dry.  Psychiatric: He has a normal mood and affect. His behavior is normal. Judgment and thought content normal.  Nursing note and vitals reviewed.   ED Course  Procedures (including critical care time) Labs Review Labs Reviewed - No data to display  Imaging Review No results found.   EKG Interpretation None      MDM   Final diagnoses:  Essential hypertension    Well-appearing 64 year old male with reported hypertension. States that his  blood pressure got as high as 940 systolic.  He denies any symptoms now. Blood pressure has been in the 150s and 160s in the emergency department. He states that he feels well, and thinks that his anxiety caused his blood pressure go up. As the patient is symptom-free, I suspect that he can be discharged home. He has close follow-up this week with his primary care doctor. I will ambulate the patient to ensure that he does not feel dizzy or short of breath. If he is able to do so without symptoms, will discharge to home. Patient understands and agrees with the plan.  Patient ambulates in hall without any complaints.  Patient discussed with Dr. Jeneen Rinks, who agrees with the plan.  Filed Vitals:   01/30/15 1801  BP: 139/88  Pulse: 56  Temp:   Resp: [address], PA-C 01/30/15 2243  Tanna Furry, MD 02/02/15 [phone]

## 2015-01-30 NOTE — ED Notes (Signed)
Pt ambulated in hallway with no difficulties or complaints.

## 2015-01-30 NOTE — Discharge Instructions (Signed)

## 2015-01-30 NOTE — ED Notes (Signed)
Pt. Stated, I think my BP is up more than it should , it seems it was up before you went to church and maybe I was stressing causing I do that really easy.  It was like 170.

## 2015-02-01 ENCOUNTER — Encounter: Payer: Self-pay | Admitting: Internal Medicine

## 2015-02-01 ENCOUNTER — Ambulatory Visit (INDEPENDENT_AMBULATORY_CARE_PROVIDER_SITE_OTHER): Payer: 59 | Admitting: Internal Medicine

## 2015-02-01 VITALS — BP 160/98 | HR 62 | Wt 174.0 lb

## 2015-02-01 DIAGNOSIS — I480 Paroxysmal atrial fibrillation: Secondary | ICD-10-CM

## 2015-02-01 DIAGNOSIS — F411 Generalized anxiety disorder: Secondary | ICD-10-CM

## 2015-02-01 DIAGNOSIS — E559 Vitamin D deficiency, unspecified: Secondary | ICD-10-CM

## 2015-02-01 DIAGNOSIS — I10 Essential (primary) hypertension: Secondary | ICD-10-CM

## 2015-02-01 DIAGNOSIS — Z0000 Encounter for general adult medical examination without abnormal findings: Secondary | ICD-10-CM

## 2015-02-01 DIAGNOSIS — E538 Deficiency of other specified B group vitamins: Secondary | ICD-10-CM

## 2015-02-01 MED ORDER — SERTRALINE HCL 100 MG PO TABS: 100.0000 mg | ORAL_TABLET | Freq: Every day | ORAL | Status: DC

## 2015-02-01 MED ORDER — ALPRAZOLAM 0.25 MG PO TABS
0.2500 mg | ORAL_TABLET | Freq: Two times a day (BID) | ORAL | Status: DC | PRN
Start: 2015-02-01 — End: 2015-10-27

## 2015-02-01 NOTE — Assessment & Plan Note (Signed)
Vit D Rx °

## 2015-02-01 NOTE — Assessment & Plan Note (Signed)
Chronic BP at home SBP 120-130 Atenolol, HCTZ

## 2015-02-01 NOTE — Assessment & Plan Note (Signed)
On B12 

## 2015-02-01 NOTE — Progress Notes (Signed)
Pre visit review using our clinic review tool, if applicable. No additional management support is needed unless otherwise documented below in the visit note. 

## 2015-02-01 NOTE — Progress Notes (Signed)
F/u ER visit 2 wks ago for high BP  Subjective:        HPI  C/o BP elevation, BP re-checked 150/90. BP is nl at home 99% of the time  His brother Chrissie Noa died in spring 2014   The patient presents for a follow-up of  chronic hypertension, chronic dyslipidemia, depression controlled with medicines. BP OK at home: SBP130-150 at home  BP Readings from Last 3 Encounters:  02/01/15 160/98  01/30/15 139/88  11/17/14 160/90   Wt Readings from Last 3 Encounters:  02/01/15 174 lb (78.926 kg)  01/30/15 174 lb (78.926 kg)  11/17/14 173 lb (78.472 kg)     Review of Systems  Constitutional: Negative for appetite change, fatigue and unexpected weight change.  HENT: Negative for congestion, nosebleeds, sneezing, sore throat and trouble swallowing.   Eyes: Negative for itching and visual disturbance.  Respiratory: Negative for cough.   Cardiovascular: Negative for leg swelling.  Gastrointestinal: Negative for nausea, diarrhea, blood in stool and abdominal distention.  Genitourinary: Negative for frequency and hematuria.  Musculoskeletal: Negative for back pain, joint swelling and gait problem.  Skin: Negative for rash.  Neurological: Negative for dizziness, tremors, speech difficulty and weakness.  Psychiatric/Behavioral: Negative for suicidal ideas, sleep disturbance, dysphoric mood and agitation. The patient is nervous/anxious.        Objective:   Physical Exam  Constitutional: He is oriented to person, place, and time. He appears well-developed. No distress.  NAD  HENT:  Mouth/Throat: Oropharynx is clear and moist.  Eyes: Conjunctivae are normal. Pupils are equal, round, and reactive to light.  Neck: Normal range of motion. No JVD present. No thyromegaly present.  Cardiovascular: Normal rate, regular rhythm, normal heart sounds and intact distal pulses.  Exam reveals no gallop and no friction rub.   No murmur heard. Pulmonary/Chest: Effort normal and breath sounds normal. No  respiratory distress. He has no wheezes. He has no rales. He exhibits no tenderness.  Abdominal: Soft. Bowel sounds are normal. He exhibits no distension and no mass. There is no tenderness. There is no rebound and no guarding.  Musculoskeletal: Normal range of motion. He exhibits no edema or tenderness.  Lymphadenopathy:    He has no cervical adenopathy.  Neurological: He is alert and oriented to person, place, and time. He has normal reflexes. No cranial nerve deficit. He exhibits normal muscle tone. He displays a negative Romberg sign. Coordination and gait normal.  No meningeal signs  Skin: Skin is warm and dry. No rash noted.  Psychiatric: He has a normal mood and affect. His behavior is normal. Judgment and thought content normal.       Lab Results  Component Value Date   WBC 4.0 10/31/2014   HGB 16.5 10/31/2014   HCT 47.7 10/31/2014   PLT 157 10/31/2014   CHOL 207* 01/19/2014   TRIG 50.0 01/19/2014   HDL 56.40 01/19/2014   ALT 13 10/31/2014   AST 18 10/31/2014   NA 139 10/31/2014   K 3.9 10/31/2014   CL 101 10/31/2014   CREATININE 0.98 10/31/2014   BUN 12 10/31/2014   CO2 25 10/31/2014   TSH 0.75 01/19/2014   PSA 0.00* 01/19/2014   INR 0.95 02/12/2011   HGBA1C  01/20/2011    5.2 (NOTE)  According to the ADA Clinical Practice Recommendations for 2011, when HbA1c is used as a screening test:   >=6.5%   Diagnostic of Diabetes Mellitus           (if abnormal result  is confirmed)  5.7-6.4%   Increased risk of developing Diabetes Mellitus  References:Diagnosis and Classification of Diabetes Mellitus,Diabetes ZLDJ,5701,77(LTJQZ 1):S62-S69 and Standards of Medical Care in         Diabetes - 2011,Diabetes ESPQ,3300,76  (Suppl 1):S11-S61.      Assessment & Plan:

## 2015-02-01 NOTE — Assessment & Plan Note (Signed)
On Atenolol, Flecanide

## 2015-02-01 NOTE — Assessment & Plan Note (Signed)
Zoloft, prn Xanax  Potential benefits of a long term benzodiazepines  use as well as potential risks  and complications were explained to the patient and were aknowledged. 

## 2015-02-17 ENCOUNTER — Encounter: Payer: Self-pay | Admitting: Internal Medicine

## 2015-02-17 ENCOUNTER — Ambulatory Visit (INDEPENDENT_AMBULATORY_CARE_PROVIDER_SITE_OTHER): Payer: 59 | Admitting: Internal Medicine

## 2015-02-17 VITALS — BP 120/82 | HR 61 | Ht 69.0 in | Wt 179.8 lb

## 2015-02-17 DIAGNOSIS — I480 Paroxysmal atrial fibrillation: Secondary | ICD-10-CM | POA: Diagnosis not present

## 2015-02-17 DIAGNOSIS — I10 Essential (primary) hypertension: Secondary | ICD-10-CM | POA: Diagnosis not present

## 2015-02-17 MED ORDER — APIXABAN 5 MG PO TABS: 5.0000 mg | ORAL_TABLET | Freq: Two times a day (BID) | ORAL | Status: DC

## 2015-02-17 NOTE — Patient Instructions (Signed)
Your physician wants you to follow-up in: 12 months with Dr Vallery Ridge will receive a reminder letter in the mail two months in advance. If you don't receive a letter, please call our office to schedule the follow-up appointment.  Your physician has recommended you make the following change in your medication:  1) Stop Aspirin 2) Start Eliquis 5 mg twice daily

## 2015-02-18 NOTE — Progress Notes (Signed)
PCP: Walker Kehr, MD Primary Cardiologist:  Previously Dr Caryl Asp is a 64 y.o. male who presents today for cardiology followup.   He has a h/o paroxysmal atrial fibrillation which has been well controlled for several years with flecainide.  Since last being seen in our clinic, the patient reports doing very well.  He remains active.  He denies any recent afib.  Today, he denies symptoms of palpitations, chest pain, shortness of breath,  lower extremity edema, dizziness, presyncope, or syncope.   He snores.  The patient is otherwise without complaint today.    Past Medical History  Diagnosis Date  . Paroxysmal a-fib   . Weight gain   . HTN (hypertension)   . Hyperlipidemia   . [place] spotted fever     age 53  . Scoliosis   . TIA (transient ischemic attack)     2012  . S/P cardiac cath April 2012    No obstructive disease. Normal LV function  . Prostate cancer     2010 Dr Terance Hart   . Vitamin B 12 deficiency   . Depression    Past Surgical History  Procedure Laterality Date  . Prostatectomy  11/2008    Robotic   . Nasal septum surgery    . Hernia repair    . Cataract extraction w/ intraocular lens  implant, bilateral    . Hernia repair  1995 and 2003    twice  . Colonoscopy  01-24-2005    TICS ONLY     Current Outpatient Prescriptions  Medication Sig Dispense Refill  . ALPRAZolam (XANAX) 0.25 MG tablet Take 1 tablet (0.25 mg total) by mouth 2 (two) times daily as needed for anxiety. 60 tablet 1  . atenolol (TENORMIN) 50 MG tablet TAKE 1 TABLET DAILY 90 tablet 1  . atorvastatin (LIPITOR) 10 MG tablet TAKE 1 TABLET DAILY (Patient taking differently: TAKE 1 TABLET BY MOUTH TWO TO THREE TIMES WEEK WITH SUPPER) 90 tablet 1  . Cholecalciferol (VITAMIN D3) 5000 UNITS TABS Take 5,000 Units by mouth daily.    . Cyanocobalamin (NASCOBAL) 500 MCG/0.1ML SOLN USE 1 SPRAY IN 1 NOSTRIL   PER WEEK (Patient taking differently: Inhale 500 mcg into the lungs once a week.  On Sundays (use either spray or tablet)) 3.9 mL 3  . Cyanocobalamin (VITAMIN B-12 SL) Place 1 tablet under the tongue daily. Use either spray or tablets    . flecainide (TAMBOCOR) 100 MG tablet Take 100 mg by mouth 2 (two) times daily.    . hydrochlorothiazide (HYDRODIURIL) 25 MG tablet Take 1 tablet (25 mg total) by mouth daily. 90 tablet 3  . ibuprofen (ADVIL,MOTRIN) 200 MG tablet Take 200-400 mg by mouth 2 (two) times daily as needed (pain).    Marland Kitchen sertraline (ZOLOFT) 100 MG tablet Take 1 tablet (100 mg total) by mouth daily. 90 tablet 3  . vardenafil (LEVITRA) 20 MG tablet Take 20 mg by mouth daily as needed for erectile dysfunction.     Marland Kitchen apixaban (ELIQUIS) 5 MG TABS tablet Take 1 tablet (5 mg total) by mouth 2 (two) times daily. 60 tablet 2   No current facility-administered medications for this visit.   ROS- all systems are reviewed and negative except as per HPI above  Physical Exam: Filed Vitals:   02/17/15 1021  BP: 120/82  Pulse: 61  Height: 5\' 9"  (1.753 m)  Weight: 179 lb 12.8 oz (81.557 kg)    GEN- The patient is well appearing, alert  and oriented x 3 today.   Head- normocephalic, atraumatic Eyes-  Sclera clear, conjunctiva pink Ears- hearing intact Oropharynx- clear Lungs- Clear to ausculation bilaterally, normal work of breathing Heart- Regular rate and rhythm, no murmurs, rubs or gallops, PMI not laterally displaced GI- soft, NT, ND, + BS Extremities- no clubbing, cyanosis, or edema  ekg today reveals sinus rhythm 56 bpm, otherwise normal ekg Echo 2013 reviewed with the patient Lipids from 3/15 reviewed with the patient  Assessment and Plan:  1. afib Well controlled His chads2vasc score is at least 3.  Per guidelines, I have recommended anticoagulation at this time  .Today, I discussed coumadin and novel anticoagulants including pradaxa, xarelto, and eliquis today as indicated for risk reduction in stroke and systemic emboli with nonvalvular atrial fibrillation.   Risks, benefits, and alternatives to each of these drugs were discussed at length today.  He did not start eliquis as prescribed last year.  We discussed AVERROES data and the poor stroke protection from ASA.  He is aware that guidelines are clear that he should be on oral anticoagualtion therapy.  He says that he will therefore start eliquis at this time. I will therefore start eliquis 5mg  BID.  I have specifically instructed him to stop ASA at this time.  2. HTN Stable No change required today  3. HL Stable No change required today  Return to see me in 1 year

## 2015-03-02 ENCOUNTER — Other Ambulatory Visit: Payer: Self-pay | Admitting: Internal Medicine

## 2015-04-19 ENCOUNTER — Other Ambulatory Visit: Payer: Self-pay | Admitting: Internal Medicine

## 2015-04-19 ENCOUNTER — Other Ambulatory Visit: Payer: Self-pay | Admitting: Cardiology

## 2015-04-19 ENCOUNTER — Other Ambulatory Visit: Payer: Self-pay

## 2015-04-19 DIAGNOSIS — I480 Paroxysmal atrial fibrillation: Secondary | ICD-10-CM

## 2015-04-19 MED ORDER — APIXABAN 5 MG PO TABS: 5.0000 mg | ORAL_TABLET | Freq: Two times a day (BID) | ORAL | Status: DC

## 2015-04-19 NOTE — Telephone Encounter (Signed)
Per note 4.7.16

## 2015-08-04 ENCOUNTER — Encounter: Payer: 59 | Admitting: Internal Medicine

## 2015-08-16 ENCOUNTER — Encounter: Payer: Self-pay | Admitting: Internal Medicine

## 2015-08-16 ENCOUNTER — Ambulatory Visit (INDEPENDENT_AMBULATORY_CARE_PROVIDER_SITE_OTHER): Payer: 59 | Admitting: Internal Medicine

## 2015-08-16 VITALS — BP 125/80 | HR 61 | Ht 68.5 in | Wt 175.0 lb

## 2015-08-16 DIAGNOSIS — Z23 Encounter for immunization: Secondary | ICD-10-CM

## 2015-08-16 DIAGNOSIS — E559 Vitamin D deficiency, unspecified: Secondary | ICD-10-CM | POA: Diagnosis not present

## 2015-08-16 DIAGNOSIS — I480 Paroxysmal atrial fibrillation: Secondary | ICD-10-CM | POA: Diagnosis not present

## 2015-08-16 DIAGNOSIS — E538 Deficiency of other specified B group vitamins: Secondary | ICD-10-CM

## 2015-08-16 DIAGNOSIS — Z0000 Encounter for general adult medical examination without abnormal findings: Secondary | ICD-10-CM

## 2015-08-16 NOTE — Assessment & Plan Note (Signed)
We discussed age appropriate health related issues, including available/recomended screening tests and vaccinations. We discussed a need for adhering to healthy diet and exercise. Labs/EKG were reviewed/ordered. All questions were answered.   

## 2015-08-16 NOTE — Progress Notes (Signed)
Subjective:  Patient ID: Evan Farmer, male    DOB: 1951/06/22  Age: 64 y.o. MRN: [id number]  CC: Annual Exam   HPI KLAY SOBOTKA presents for a well exam. He is a blood donor  Outpatient Prescriptions Prior to Visit  Medication Sig Dispense Refill  . ALPRAZolam (XANAX) 0.25 MG tablet Take 1 tablet (0.25 mg total) by mouth 2 (two) times daily as needed for anxiety. 60 tablet 1  . apixaban (ELIQUIS) 5 MG TABS tablet Take 1 tablet (5 mg total) by mouth 2 (two) times daily. 180 tablet 2  . atenolol (TENORMIN) 50 MG tablet TAKE 1 TABLET DAILY 90 tablet 3  . atorvastatin (LIPITOR) 10 MG tablet TAKE 1 TABLET DAILY (Patient taking differently: TAKE 1 TABLET BY MOUTH TWO TO THREE TIMES WEEK WITH SUPPER) 90 tablet 1  . Cholecalciferol (VITAMIN D3) 5000 UNITS TABS Take 5,000 Units by mouth daily.    . Cyanocobalamin (NASCOBAL) 500 MCG/0.1ML SOLN USE 1 SPRAY IN 1 NOSTRIL   PER WEEK (Patient taking differently: Inhale 500 mcg into the lungs once a week. On Sundays (use either spray or tablet)) 3.9 mL 3  . Cyanocobalamin (VITAMIN B-12 SL) Place 1 tablet under the tongue daily. Use either spray or tablets    . flecainide (TAMBOCOR) 100 MG tablet TAKE 1 TABLET TWICE A DAY 180 tablet 2  . ibuprofen (ADVIL,MOTRIN) 200 MG tablet Take 200-400 mg by mouth 2 (two) times daily as needed (pain).    Marland Kitchen sertraline (ZOLOFT) 100 MG tablet Take 1 tablet (100 mg total) by mouth daily. 90 tablet 3  . vardenafil (LEVITRA) 20 MG tablet Take 20 mg by mouth daily as needed for erectile dysfunction.     . flecainide (TAMBOCOR) 100 MG tablet Take 100 mg by mouth 2 (two) times daily.    . flecainide (TAMBOCOR) 100 MG tablet TAKE 1 TABLET BY MOUTH TWICE DAILY 10 tablet 0  . hydrochlorothiazide (HYDRODIURIL) 25 MG tablet Take 1 tablet (25 mg total) by mouth daily. (Patient not taking: Reported on 08/16/2015) 90 tablet 3   No facility-administered medications prior to visit.    ROS Review of Systems  Constitutional:  Negative for appetite change, fatigue and unexpected weight change.  HENT: Negative for congestion, nosebleeds, sneezing, sore throat and trouble swallowing.   Eyes: Negative for itching and visual disturbance.  Respiratory: Negative for cough.   Cardiovascular: Negative for chest pain, palpitations and leg swelling.  Gastrointestinal: Negative for nausea, diarrhea, blood in stool and abdominal distention.  Genitourinary: Negative for frequency and hematuria.  Musculoskeletal: Negative for back pain, joint swelling, gait problem and neck pain.  Skin: Negative for rash.  Neurological: Negative for dizziness, tremors, speech difficulty and weakness.  Psychiatric/Behavioral: Negative for suicidal ideas, sleep disturbance, dysphoric mood and agitation. The patient is not nervous/anxious.     Objective:  BP 150/90 mmHg  Pulse 61  Ht 5' 8.5" (1.74 m)  Wt 175 lb (79.379 kg)  BMI 26.22 kg/m2  SpO2 96%  BP Readings from Last 3 Encounters:  08/16/15 150/90  02/17/15 120/82  02/01/15 160/98    Wt Readings from Last 3 Encounters:  08/16/15 175 lb (79.379 kg)  02/17/15 179 lb 12.8 oz (81.557 kg)  02/01/15 174 lb (78.926 kg)    Physical Exam  Constitutional: He is oriented to person, place, and time. He appears well-developed. No distress.  NAD  HENT:  Mouth/Throat: Oropharynx is clear and moist.  Eyes: Conjunctivae are normal. Pupils are equal, round, and  reactive to light.  Neck: Normal range of motion. No JVD present. No thyromegaly present.  Cardiovascular: Normal rate, regular rhythm, normal heart sounds and intact distal pulses.  Exam reveals no gallop and no friction rub.   No murmur heard. Pulmonary/Chest: Effort normal and breath sounds normal. No respiratory distress. He has no wheezes. He has no rales. He exhibits no tenderness.  Abdominal: Soft. Bowel sounds are normal. He exhibits no distension and no mass. There is no tenderness. There is no rebound and no guarding.    Musculoskeletal: Normal range of motion. He exhibits no edema or tenderness.  Lymphadenopathy:    He has no cervical adenopathy.  Neurological: He is alert and oriented to person, place, and time. He has normal reflexes. No cranial nerve deficit. He exhibits normal muscle tone. He displays a negative Romberg sign. Coordination and gait normal.  Skin: Skin is warm and dry. No rash noted.  Psychiatric: He has a normal mood and affect. His behavior is normal. Judgment and thought content normal.  Rectal per Urol  Lab Results  Component Value Date   WBC 4.0 10/31/2014   HGB 16.5 10/31/2014   HCT 47.7 10/31/2014   PLT 157 10/31/2014   GLUCOSE 130* 10/31/2014   CHOL 207* 01/19/2014   TRIG 50.0 01/19/2014   HDL 56.40 01/19/2014   LDLCALC 141* 01/19/2014   ALT 13 10/31/2014   AST 18 10/31/2014   NA 139 10/31/2014   K 3.9 10/31/2014   CL 101 10/31/2014   CREATININE 0.98 10/31/2014   BUN 12 10/31/2014   CO2 25 10/31/2014   TSH 0.75 01/19/2014   PSA 0.00* 01/19/2014   INR 0.95 02/12/2011   HGBA1C  01/20/2011    5.2 (NOTE)                                                                       According to the ADA Clinical Practice Recommendations for 2011, when HbA1c is used as a screening test:   >=6.5%   Diagnostic of Diabetes Mellitus           (if abnormal result  is confirmed)  5.7-6.4%   Increased risk of developing Diabetes Mellitus  References:Diagnosis and Classification of Diabetes Mellitus,Diabetes HQPR,9163,84(YKZLD 1):S62-S69 and Standards of Medical Care in         Diabetes - 2011,Diabetes Care,2011,34  (Suppl 1):S11-S61.    No results found.  Assessment & Plan:   Bryer was seen today for annual exam.  Diagnoses and all orders for this visit:  B12 deficiency  Well adult exam   I am having Mr. Inglett maintain his vardenafil, Cyanocobalamin, hydrochlorothiazide, atorvastatin, Cyanocobalamin (VITAMIN B-12 SL), Vitamin D3, ibuprofen, ALPRAZolam, sertraline, atenolol,  apixaban, and flecainide.  No orders of the defined types were placed in this encounter.     Follow-up: No Follow-up on file.  Walker Kehr, MD

## 2015-08-16 NOTE — Assessment & Plan Note (Signed)
Pt cut back on Atenolol due to low HR

## 2015-08-16 NOTE — Assessment & Plan Note (Signed)
On b12 

## 2015-08-16 NOTE — Progress Notes (Signed)
Pre visit review using our clinic review tool, if applicable. No additional management support is needed unless otherwise documented below in the visit note. 

## 2015-08-19 ENCOUNTER — Other Ambulatory Visit (INDEPENDENT_AMBULATORY_CARE_PROVIDER_SITE_OTHER): Payer: 59

## 2015-08-19 DIAGNOSIS — Z0000 Encounter for general adult medical examination without abnormal findings: Secondary | ICD-10-CM | POA: Diagnosis not present

## 2015-08-19 DIAGNOSIS — E538 Deficiency of other specified B group vitamins: Secondary | ICD-10-CM

## 2015-08-19 DIAGNOSIS — E559 Vitamin D deficiency, unspecified: Secondary | ICD-10-CM | POA: Diagnosis not present

## 2015-08-19 DIAGNOSIS — I480 Paroxysmal atrial fibrillation: Secondary | ICD-10-CM

## 2015-08-19 DIAGNOSIS — F411 Generalized anxiety disorder: Secondary | ICD-10-CM

## 2015-08-19 DIAGNOSIS — I10 Essential (primary) hypertension: Secondary | ICD-10-CM

## 2015-08-19 LAB — CBC WITH DIFFERENTIAL/PLATELET
BASOS ABS: 0 10*3/uL (ref 0.0–0.1)
Basophils Relative: 0.5 % (ref 0.0–3.0)
Eosinophils Absolute: 0 10*3/uL (ref 0.0–0.7)
Eosinophils Relative: 1.1 % (ref 0.0–5.0)
HCT: 44.5 % (ref 39.0–52.0)
Hemoglobin: 15.1 g/dL (ref 13.0–17.0)
LYMPHS PCT: 24.6 % (ref 12.0–46.0)
Lymphs Abs: 1 10*3/uL (ref 0.7–4.0)
MCHC: 34 g/dL (ref 30.0–36.0)
MCV: 93.3 fl (ref 78.0–100.0)
MONOS PCT: 8.8 % (ref 3.0–12.0)
Monocytes Absolute: 0.4 10*3/uL (ref 0.1–1.0)
NEUTROS ABS: 2.7 10*3/uL (ref 1.4–7.7)
Neutrophils Relative %: 65 % (ref 43.0–77.0)
PLATELETS: 163 10*3/uL (ref 150.0–400.0)
RBC: 4.77 Mil/uL (ref 4.22–5.81)
RDW: 13.6 % (ref 11.5–15.5)
WBC: 4.2 10*3/uL (ref 4.0–10.5)

## 2015-08-19 LAB — VITAMIN D 25 HYDROXY (VIT D DEFICIENCY, FRACTURES): VITD: 26.1 ng/mL — ABNORMAL LOW (ref 30.00–100.00)

## 2015-08-19 LAB — HEPATIC FUNCTION PANEL
ALBUMIN: 3.6 g/dL (ref 3.5–5.2)
ALK PHOS: 64 U/L (ref 39–117)
ALT: 12 U/L (ref 0–53)
AST: 15 U/L (ref 0–37)
Bilirubin, Direct: 0.1 mg/dL (ref 0.0–0.3)
Total Bilirubin: 0.7 mg/dL (ref 0.2–1.2)
Total Protein: 6.5 g/dL (ref 6.0–8.3)

## 2015-08-19 LAB — LIPID PANEL
CHOL/HDL RATIO: 4
CHOLESTEROL: 189 mg/dL (ref 0–200)
HDL: 48.5 mg/dL (ref >39.00)
LDL Cholesterol: 125 mg/dL — ABNORMAL HIGH (ref 0–99)
NonHDL: 140.69
TRIGLYCERIDES: 80 mg/dL (ref 0.0–149.0)
VLDL: 16 mg/dL (ref 0.0–40.0)

## 2015-08-19 LAB — URINALYSIS, ROUTINE W REFLEX MICROSCOPIC
Bilirubin Urine: NEGATIVE
KETONES UR: NEGATIVE
LEUKOCYTES UA: NEGATIVE
NITRITE: NEGATIVE
Specific Gravity, Urine: >=1.03 — AB (ref 1.000–1.030)
TOTAL PROTEIN, URINE-UPE24: NEGATIVE
URINE GLUCOSE: NEGATIVE
UROBILINOGEN UA: 0.2 (ref 0.0–1.0)
pH: 5.5 (ref 5.0–8.0)

## 2015-08-19 LAB — BASIC METABOLIC PANEL
BUN: 13 mg/dL (ref 6–23)
CALCIUM: 8.6 mg/dL (ref 8.4–10.5)
CO2: 30 meq/L (ref 19–32)
Chloride: 103 mEq/L (ref 96–112)
Creatinine, Ser: 1.09 mg/dL (ref 0.40–1.50)
GFR: 72.7 mL/min (ref >60.00)
Glucose, Bld: 96 mg/dL (ref 70–99)
Potassium: 4.3 mEq/L (ref 3.5–5.1)
Sodium: 139 mEq/L (ref 135–145)

## 2015-08-19 LAB — VITAMIN B12: Vitamin B-12: >1500 pg/mL — ABNORMAL HIGH (ref 211–911)

## 2015-08-19 LAB — TSH: TSH: 0.93 u[IU]/mL (ref 0.35–4.50)

## 2015-08-20 MED ORDER — VITAMIN D 1000 UNITS PO TABS: 1000.0000 [IU] | ORAL_TABLET | Freq: Every day | ORAL | Status: DC

## 2015-08-20 MED ORDER — ERGOCALCIFEROL 1.25 MG (50000 UT) PO CAPS: 50000.0000 [IU] | ORAL_CAPSULE | ORAL | Status: DC

## 2015-08-20 NOTE — Assessment & Plan Note (Signed)
Low - start Vit D prescription 50000 iu weekly (Rx emailed to your pharmacy) followed by over-the-counter Vit D 1000 iu daily.

## 2015-09-29 ENCOUNTER — Other Ambulatory Visit: Payer: Self-pay | Admitting: Internal Medicine

## 2015-10-04 ENCOUNTER — Other Ambulatory Visit: Payer: Self-pay

## 2015-10-04 MED ORDER — CYANOCOBALAMIN 500 MCG/0.1ML NA SOLN: NASAL | Status: DC

## 2015-10-12 ENCOUNTER — Other Ambulatory Visit: Payer: Self-pay

## 2015-10-27 ENCOUNTER — Other Ambulatory Visit: Payer: Self-pay | Admitting: Internal Medicine

## 2015-10-27 ENCOUNTER — Telehealth: Payer: Self-pay | Admitting: Internal Medicine

## 2015-10-27 NOTE — Telephone Encounter (Signed)
Spoke with patient and he has not been taking his HCTZ as he says he did not have trouble with fluid so he stopped on his own.  I have asked to restart and let me know if his BP's stay up.  He will do so and let me know if further problems

## 2015-10-27 NOTE — Telephone Encounter (Signed)
Pt c/o BP issue: STAT if pt c/o blurred vision, one-sided weakness or slurred speech  1. What are your last 5 BP readings? Latest had 0000000 systolic- pt stated it has been in the 160s the last week   2. Are you having any other symptoms (ex. Dizziness, headache, blurred vision, passed out)? "winded" but not SOB   3. What is your BP issue? BP is high

## 2015-10-27 NOTE — Telephone Encounter (Signed)
Pt requesting refill for ALPRAZolam Duanne Moron) 0.25 MG tablet [id number] Pharmacy [facility] on [address]

## 2015-10-31 ENCOUNTER — Encounter (HOSPITAL_COMMUNITY): Payer: Self-pay | Admitting: *Deleted

## 2015-10-31 ENCOUNTER — Emergency Department (INDEPENDENT_AMBULATORY_CARE_PROVIDER_SITE_OTHER)
Admission: EM | Admit: 2015-10-31 | Discharge: 2015-10-31 | Disposition: A | Discharge status: Home / Self Care | Payer: 59 | Source: Home / Self Care | Attending: Family Medicine | Admitting: Family Medicine

## 2015-10-31 DIAGNOSIS — F410 Panic disorder [episodic paroxysmal anxiety] without agoraphobia: Secondary | ICD-10-CM | POA: Diagnosis not present

## 2015-10-31 MED ORDER — ALPRAZOLAM 0.25 MG PO TABS: 0.2500 mg | ORAL_TABLET | Freq: Two times a day (BID) | ORAL | Status: DC | PRN

## 2015-10-31 NOTE — ED Notes (Signed)
Pt has  A  History  Of  Hypertension       States     Is out  Of  His alprazolam         And  Thinks that  Will  Help            He  Reports   He  Has  Been  Anxious  As  Of late  As   Well

## 2015-10-31 NOTE — Discharge Instructions (Signed)
See your doctor for further medicine

## 2015-10-31 NOTE — ED Provider Notes (Signed)
CSN: [id number]     Arrival date & time 10/31/15  1952 History   First MD Initiated Contact with Patient 10/31/15 2009     Chief Complaint  Patient presents with  . Hypertension   (Consider location/radiation/quality/duration/timing/severity/associated sxs/prior Treatment) Patient is a 64 y.o. male presenting with hypertension. The history is provided by the patient.  Hypertension This is a chronic problem. The current episode started more than 2 days ago (pt stressed and anxious.). The problem has been gradually improving. Pertinent negatives include no chest pain, no abdominal pain, no headaches and no shortness of breath. Associated symptoms comments: No other sx.. The symptoms are aggravated by stress.    Past Medical History  Diagnosis Date  . Paroxysmal a-fib ([place])   . Weight gain   . HTN (hypertension)   . Hyperlipidemia   . [place] spotted fever     age 89  . Scoliosis   . TIA (transient ischemic attack)     2012  . S/P cardiac cath April 2012    No obstructive disease. Normal LV function  . Prostate cancer [facility])     2010 Dr Terance Hart   . Vitamin B 12 deficiency   . Depression    Past Surgical History  Procedure Laterality Date  . Prostatectomy  11/2008    Robotic   . Nasal septum surgery    . Hernia repair    . Cataract extraction w/ intraocular lens  implant, bilateral    . Hernia repair  1995 and 2003    twice  . Colonoscopy  01-24-2005    TICS ONLY    Family History  Problem Relation Age of Onset  . Arrhythmia Mother     A-FIB  . Cancer Other     lung and prostate  . Cancer Father   . Colon cancer Paternal Uncle   . Rectal cancer Neg Hx   . Stomach cancer Neg Hx    Social History  Substance Use Topics  . Smoking status: Never Smoker   . Smokeless tobacco: Never Used  . Alcohol Use: 4.8 oz/week    4 Glasses of wine, 4 Shots of liquor per week     Comment: Social alcohol use    Review of Systems  Constitutional: Negative.   Respiratory:  Negative.  Negative for shortness of breath.   Cardiovascular: Negative.  Negative for chest pain.  Gastrointestinal: Negative for abdominal pain.  Neurological: Negative.  Negative for headaches.  All other systems reviewed and are negative.   Allergies  Procainamide hcl and Quinidine  Home Medications   Prior to Admission medications   Medication Sig Start Date End Date Taking? Authorizing Provider  ALPRAZolam (XANAX) 0.25 MG tablet Take 1 tablet (0.25 mg total) by mouth 2 (two) times daily as needed for anxiety. 10/31/15   Billy Fischer, MD  apixaban (ELIQUIS) 5 MG TABS tablet Take 1 tablet (5 mg total) by mouth 2 (two) times daily. 04/19/15   Thompson Grayer, MD  atenolol (TENORMIN) 50 MG tablet TAKE 1 TABLET DAILY 03/03/15   Thompson Grayer, MD  atorvastatin (LIPITOR) 10 MG tablet TAKE 1 TABLET DAILY Patient taking differently: TAKE 1 TABLET BY MOUTH TWO TO THREE TIMES WEEK WITH SUPPER 12/10/14   Aleksei Plotnikov V, MD  cholecalciferol (VITAMIN D) 1000 UNITS tablet Take 1 tablet (1,000 Units total) by mouth daily. 08/20/15   Aleksei Plotnikov V, MD  Cyanocobalamin (NASCOBAL) 500 MCG/0.1ML SOLN USE 1 SPRAY IN 1 NOSTRIL   PER WEEK 10/04/15  Aleksei Plotnikov V, MD  Cyanocobalamin (VITAMIN B-12 SL) Place 1 tablet under the tongue daily. Use either spray or tablets    Historical Provider, MD  ergocalciferol (VITAMIN D2) 50000 UNITS capsule Take 1 capsule (50,000 Units total) by mouth once a week. 08/20/15   Aleksei Plotnikov V, MD  flecainide (TAMBOCOR) 100 MG tablet TAKE 1 TABLET BY MOUTH TWICE DAILY 09/30/15   Thompson Grayer, MD  hydrochlorothiazide (HYDRODIURIL) 25 MG tablet Take 1 tablet (25 mg total) by mouth daily. Patient not taking: Reported on 08/16/2015 11/17/14   Lew Dawes V, MD  ibuprofen (ADVIL,MOTRIN) 200 MG tablet Take 200-400 mg by mouth 2 (two) times daily as needed (pain).    Historical Provider, MD  sertraline (ZOLOFT) 100 MG tablet Take 1 tablet (100 mg total) by mouth daily.  02/01/15   Aleksei Plotnikov V, MD  vardenafil (LEVITRA) 20 MG tablet Take 20 mg by mouth daily as needed for erectile dysfunction.     Historical Provider, MD   Meds Ordered and Administered this Visit  Medications - No data to display  BP 176/94 mmHg  Pulse 59  Temp(Src) 98.3 F (36.8 C) (Oral)  Resp 18  SpO2 97% No data found.   Physical Exam  Constitutional: He is oriented to person, place, and time. He appears well-developed and well-nourished. No distress.  Neck: Normal range of motion. Neck supple.  Cardiovascular: Normal rate, regular rhythm, normal heart sounds and intact distal pulses.   Pulmonary/Chest: Effort normal.  Lymphadenopathy:    He has no cervical adenopathy.  Neurological: He is alert and oriented to person, place, and time.  Skin: Skin is warm and dry.  Nursing note and vitals reviewed.   ED Course  Procedures (including critical care time)  Labs Review Labs Reviewed - No data to display  Imaging Review No results found.   Visual Acuity Review  Right Eye Distance:   Left Eye Distance:   Bilateral Distance:    Right Eye Near:   Left Eye Near:    Bilateral Near:         MDM   1. Anxiety attack    Pt agrees to a few xanax and will f/u with lmd.    Billy Fischer, MD 10/31/15 2025

## 2015-11-03 ENCOUNTER — Telehealth: Payer: Self-pay | Admitting: Internal Medicine

## 2015-11-03 MED ORDER — ALPRAZOLAM 0.25 MG PO TABS: 0.2500 mg | ORAL_TABLET | Freq: Two times a day (BID) | ORAL | Status: DC | PRN

## 2015-11-03 NOTE — Telephone Encounter (Signed)
Patient is calling to get a refill on hydrochlorothiazide (HYDRODIURIL) 25 MG tablet [id number]

## 2015-11-04 MED ORDER — HYDROCHLOROTHIAZIDE 25 MG PO TABS: 25.0000 mg | ORAL_TABLET | Freq: Every day | ORAL | Status: DC

## 2015-11-04 NOTE — Telephone Encounter (Signed)
Done. See meds.  

## 2015-11-08 ENCOUNTER — Telehealth: Payer: Self-pay | Admitting: Internal Medicine

## 2015-11-08 NOTE — Telephone Encounter (Signed)
[facility] called after 5 PM regarding the prescription for hydrochlorothiazide. Reference number is [id number] and the call back number is [phone]. Spoke with Joellen Jersey, she said the prescription will be held until she hears back from [place]. Please Advise MS

## 2015-11-09 NOTE — Telephone Encounter (Signed)
Called [facility] spoke with pharmacist Maudie Mercury needing to verify rx HCTZ that was sent in. Was it ok to fill with sulfa allergy they have on file. Inform pharmacist yes ok to fill pt been taking medication since Jan 2016...Evan Farmer

## 2015-11-11 NOTE — Telephone Encounter (Signed)
It is ok Thx 

## 2016-02-14 ENCOUNTER — Ambulatory Visit: Payer: 59 | Admitting: Internal Medicine

## 2016-02-27 ENCOUNTER — Encounter: Payer: Self-pay | Admitting: Internal Medicine

## 2016-02-27 ENCOUNTER — Ambulatory Visit (INDEPENDENT_AMBULATORY_CARE_PROVIDER_SITE_OTHER): Payer: 59 | Admitting: Internal Medicine

## 2016-02-27 ENCOUNTER — Other Ambulatory Visit (INDEPENDENT_AMBULATORY_CARE_PROVIDER_SITE_OTHER): Payer: 59

## 2016-02-27 VITALS — BP 186/96 | HR 64 | Wt 177.0 lb

## 2016-02-27 DIAGNOSIS — E538 Deficiency of other specified B group vitamins: Secondary | ICD-10-CM

## 2016-02-27 DIAGNOSIS — I480 Paroxysmal atrial fibrillation: Secondary | ICD-10-CM

## 2016-02-27 LAB — BASIC METABOLIC PANEL
BUN: 15 mg/dL (ref 6–23)
CHLORIDE: 101 meq/L (ref 96–112)
CO2: 27 meq/L (ref 19–32)
CREATININE: 1.14 mg/dL (ref 0.40–1.50)
Calcium: 9.3 mg/dL (ref 8.4–10.5)
GFR: 68.91 mL/min (ref >60.00)
Glucose, Bld: 102 mg/dL — ABNORMAL HIGH (ref 70–99)
POTASSIUM: 4.1 meq/L (ref 3.5–5.1)
SODIUM: 137 meq/L (ref 135–145)

## 2016-02-27 LAB — CBC WITH DIFFERENTIAL/PLATELET
BASOS PCT: 0.4 % (ref 0.0–3.0)
Basophils Absolute: 0 10*3/uL (ref 0.0–0.1)
EOS ABS: 0 10*3/uL (ref 0.0–0.7)
Eosinophils Relative: 0.5 % (ref 0.0–5.0)
HEMATOCRIT: 48.5 % (ref 39.0–52.0)
HEMOGLOBIN: 16.5 g/dL (ref 13.0–17.0)
LYMPHS PCT: 15.3 % (ref 12.0–46.0)
Lymphs Abs: 0.9 10*3/uL (ref 0.7–4.0)
MCHC: 34.1 g/dL (ref 30.0–36.0)
MCV: 95.7 fl (ref 78.0–100.0)
MONOS PCT: 5.7 % (ref 3.0–12.0)
Monocytes Absolute: 0.3 10*3/uL (ref 0.1–1.0)
NEUTROS ABS: 4.4 10*3/uL (ref 1.4–7.7)
Neutrophils Relative %: 78.1 % — ABNORMAL HIGH (ref 43.0–77.0)
PLATELETS: 179 10*3/uL (ref 150.0–400.0)
RBC: 5.07 Mil/uL (ref 4.22–5.81)
RDW: 13.6 % (ref 11.5–15.5)
WBC: 5.7 10*3/uL (ref 4.0–10.5)

## 2016-02-27 LAB — IBC PANEL
Iron: 141 ug/dL (ref 42–165)
SATURATION RATIOS: 30.2 % (ref 20.0–50.0)
TRANSFERRIN: 334 mg/dL (ref 212.0–360.0)

## 2016-02-27 MED ORDER — ATORVASTATIN CALCIUM 10 MG PO TABS: 10.0000 mg | ORAL_TABLET | Freq: Every day | ORAL | Status: DC

## 2016-02-27 MED ORDER — ALPRAZOLAM 0.25 MG PO TABS: 0.2500 mg | ORAL_TABLET | Freq: Two times a day (BID) | ORAL | Status: DC | PRN

## 2016-02-27 NOTE — Assessment & Plan Note (Signed)
In NSR 

## 2016-02-27 NOTE — Progress Notes (Signed)
Pre visit review using our clinic review tool, if applicable. No additional management support is needed unless otherwise documented below in the visit note. 

## 2016-02-27 NOTE — Assessment & Plan Note (Signed)
On B12 

## 2016-02-27 NOTE — Progress Notes (Signed)
Subjective:  Patient ID: Evan Farmer, male    DOB: [birth year]  Age: 65 y.o. MRN: [id number]  CC: No chief complaint on file.   HPI ALDINE GORNIK presents for dyslipidemia, anxiety, B12 def, A fib f/u  Outpatient Prescriptions Prior to Visit  Medication Sig Dispense Refill  . apixaban (ELIQUIS) 5 MG TABS tablet Take 1 tablet (5 mg total) by mouth 2 (two) times daily. 180 tablet 2  . atenolol (TENORMIN) 50 MG tablet TAKE 1 TABLET DAILY 90 tablet 3  . cholecalciferol (VITAMIN D) 1000 UNITS tablet Take 1 tablet (1,000 Units total) by mouth daily. 100 tablet 11  . Cyanocobalamin (NASCOBAL) 500 MCG/0.1ML SOLN USE 1 SPRAY IN 1 NOSTRIL   PER WEEK 3.9 mL 3  . Cyanocobalamin (VITAMIN B-12 SL) Place 1 tablet under the tongue daily. Use either spray or tablets    . flecainide (TAMBOCOR) 100 MG tablet TAKE 1 TABLET BY MOUTH TWICE DAILY 180 tablet 0  . hydrochlorothiazide (HYDRODIURIL) 25 MG tablet Take 1 tablet (25 mg total) by mouth daily. 90 tablet 3  . ibuprofen (ADVIL,MOTRIN) 200 MG tablet Take 200-400 mg by mouth 2 (two) times daily as needed (pain).    Marland Kitchen sertraline (ZOLOFT) 100 MG tablet Take 1 tablet (100 mg total) by mouth daily. 90 tablet 3  . vardenafil (LEVITRA) 20 MG tablet Take 20 mg by mouth daily as needed for erectile dysfunction.     Marland Kitchen atorvastatin (LIPITOR) 10 MG tablet TAKE 1 TABLET DAILY (Patient taking differently: TAKE 1 TABLET BY MOUTH TWO TO THREE TIMES WEEK WITH SUPPER) 90 tablet 1  . ALPRAZolam (XANAX) 0.25 MG tablet Take 1 tablet (0.25 mg total) by mouth 2 (two) times daily as needed for anxiety. (Patient not taking: Reported on 02/27/2016) 60 tablet 1  . ALPRAZolam (XANAX) 0.25 MG tablet Take 1 tablet (0.25 mg total) by mouth 2 (two) times daily as needed for anxiety. (Patient not taking: Reported on 02/27/2016) 10 tablet 0  . ergocalciferol (VITAMIN D2) 50000 UNITS capsule Take 1 capsule (50,000 Units total) by mouth once a week. (Patient not taking: Reported on 02/27/2016)  6 capsule 0   No facility-administered medications prior to visit.    ROS Review of Systems  Constitutional: Negative for appetite change, fatigue and unexpected weight change.  HENT: Negative for congestion, nosebleeds, sneezing, sore throat and trouble swallowing.   Eyes: Negative for itching and visual disturbance.  Respiratory: Negative for cough.   Cardiovascular: Negative for chest pain, palpitations and leg swelling.  Gastrointestinal: Negative for nausea, diarrhea, blood in stool and abdominal distention.  Genitourinary: Negative for frequency and hematuria.  Musculoskeletal: Negative for back pain, joint swelling, gait problem and neck pain.  Skin: Negative for rash.  Neurological: Negative for dizziness, tremors, speech difficulty and weakness.  Psychiatric/Behavioral: Negative for sleep disturbance, dysphoric mood and agitation. The patient is nervous/anxious.     Objective:  BP 186/96 mmHg  Pulse 64  Wt 177 lb (80.287 kg)  SpO2 97%  BP Readings from Last 3 Encounters:  02/27/16 186/96  10/31/15 176/94  08/16/15 125/80    Wt Readings from Last 3 Encounters:  02/27/16 177 lb (80.287 kg)  08/16/15 175 lb (79.379 kg)  02/17/15 179 lb 12.8 oz (81.557 kg)    Physical Exam  Constitutional: He is oriented to person, place, and time. He appears well-developed. No distress.  NAD  HENT:  Mouth/Throat: Oropharynx is clear and moist.  Eyes: Conjunctivae are normal. Pupils are equal, round, and  reactive to light.  Neck: Normal range of motion. No JVD present. No thyromegaly present.  Cardiovascular: Normal rate, regular rhythm, normal heart sounds and intact distal pulses.  Exam reveals no gallop and no friction rub.   No murmur heard. Pulmonary/Chest: Effort normal and breath sounds normal. No respiratory distress. He has no wheezes. He has no rales. He exhibits no tenderness.  Abdominal: Soft. Bowel sounds are normal. He exhibits no distension and no mass. There is no  tenderness. There is no rebound and no guarding.  Musculoskeletal: Normal range of motion. He exhibits no edema or tenderness.  Lymphadenopathy:    He has no cervical adenopathy.  Neurological: He is alert and oriented to person, place, and time. He has normal reflexes. No cranial nerve deficit. He exhibits normal muscle tone. He displays a negative Romberg sign. Coordination and gait normal.  Skin: Skin is warm and dry. No rash noted.  Psychiatric: He has a normal mood and affect. His behavior is normal. Judgment and thought content normal.    Lab Results  Component Value Date   WBC 4.2 08/19/2015   HGB 15.1 08/19/2015   HCT 44.5 08/19/2015   PLT 163.0 08/19/2015   GLUCOSE 96 08/19/2015   CHOL 189 08/19/2015   TRIG 80.0 08/19/2015   HDL 48.50 08/19/2015   LDLCALC 125* 08/19/2015   ALT 12 08/19/2015   AST 15 08/19/2015   NA 139 08/19/2015   K 4.3 08/19/2015   CL 103 08/19/2015   CREATININE 1.09 08/19/2015   BUN 13 08/19/2015   CO2 30 08/19/2015   TSH 0.93 08/19/2015   PSA 0.00* 01/19/2014   INR 0.95 02/12/2011   HGBA1C  01/20/2011    5.2 (NOTE)                                                                       According to the ADA Clinical Practice Recommendations for 2011, when HbA1c is used as a screening test:   >=6.5%   Diagnostic of Diabetes Mellitus           (if abnormal result  is confirmed)  5.7-6.4%   Increased risk of developing Diabetes Mellitus  References:Diagnosis and Classification of Diabetes Mellitus,Diabetes [id number] 1):S62-S69 and Standards of Medical Care in         Diabetes - 2011,Diabetes Care,2011,34  (Suppl 1):S11-S61.    No results found.  Assessment & Plan:   There are no diagnoses linked to this encounter. I have discontinued Mr. Fewkes ergocalciferol. I have also changed his atorvastatin. Additionally, I am having him maintain his vardenafil, Cyanocobalamin (VITAMIN B-12 SL), ibuprofen, sertraline, atenolol, apixaban,  cholecalciferol, flecainide, Cyanocobalamin, ALPRAZolam, ALPRAZolam, and hydrochlorothiazide.  Meds ordered this encounter  Medications  . atorvastatin (LIPITOR) 10 MG tablet    Sig: Take 1 tablet (10 mg total) by mouth daily.    Dispense:  90 tablet    Refill:  3     Follow-up: No Follow-up on file.  Walker Kehr, MD

## 2016-03-11 ENCOUNTER — Other Ambulatory Visit: Payer: Self-pay | Admitting: Internal Medicine

## 2016-04-17 ENCOUNTER — Other Ambulatory Visit: Payer: Self-pay | Admitting: *Deleted

## 2016-04-17 MED ORDER — VARDENAFIL HCL 20 MG PO TABS: 20.0000 mg | ORAL_TABLET | Freq: Every day | ORAL | Status: DC | PRN

## 2016-04-17 MED ORDER — ATORVASTATIN CALCIUM 10 MG PO TABS: 10.0000 mg | ORAL_TABLET | Freq: Every day | ORAL | Status: DC

## 2016-04-17 NOTE — Telephone Encounter (Signed)
Rec'd call pt requesting refills on his Lipitor & levitra. Sent electronically,.../lmb

## 2016-04-18 MED ORDER — VARDENAFIL HCL 20 MG PO TABS: 20.0000 mg | ORAL_TABLET | Freq: Every day | ORAL | Status: DC | PRN

## 2016-04-18 NOTE — Addendum Note (Signed)
Addended by: Earnstine Regal on: 04/18/2016 02:57 PM   Modules accepted: Orders

## 2016-04-18 NOTE — Telephone Encounter (Signed)
Pt call requesting Levitra to be sent to [facility] [address]. Resent to correct pharmacy...[name]

## 2016-05-31 ENCOUNTER — Other Ambulatory Visit: Payer: Self-pay | Admitting: Internal Medicine

## 2016-07-04 ENCOUNTER — Telehealth: Payer: Self-pay | Admitting: Internal Medicine

## 2016-07-04 ENCOUNTER — Encounter (HOSPITAL_COMMUNITY): Payer: Self-pay | Admitting: Nurse Practitioner

## 2016-07-04 ENCOUNTER — Ambulatory Visit (HOSPITAL_COMMUNITY)
Admission: RE | Admit: 2016-07-04 | Discharge: 2016-07-04 | Disposition: A | Discharge status: Home / Self Care | Payer: 59 | Source: Ambulatory Visit | Attending: Nurse Practitioner | Admitting: Nurse Practitioner

## 2016-07-04 VITALS — BP 160/94 | HR 108 | Ht 68.5 in | Wt 172.8 lb

## 2016-07-04 DIAGNOSIS — F329 Major depressive disorder, single episode, unspecified: Secondary | ICD-10-CM | POA: Diagnosis not present

## 2016-07-04 DIAGNOSIS — Z8673 Personal history of transient ischemic attack (TIA), and cerebral infarction without residual deficits: Secondary | ICD-10-CM | POA: Diagnosis not present

## 2016-07-04 DIAGNOSIS — Z8546 Personal history of malignant neoplasm of prostate: Secondary | ICD-10-CM | POA: Insufficient documentation

## 2016-07-04 DIAGNOSIS — Z79899 Other long term (current) drug therapy: Secondary | ICD-10-CM | POA: Insufficient documentation

## 2016-07-04 DIAGNOSIS — I480 Paroxysmal atrial fibrillation: Secondary | ICD-10-CM | POA: Insufficient documentation

## 2016-07-04 DIAGNOSIS — I10 Essential (primary) hypertension: Secondary | ICD-10-CM | POA: Diagnosis not present

## 2016-07-04 DIAGNOSIS — E538 Deficiency of other specified B group vitamins: Secondary | ICD-10-CM | POA: Diagnosis not present

## 2016-07-04 DIAGNOSIS — E785 Hyperlipidemia, unspecified: Secondary | ICD-10-CM | POA: Insufficient documentation

## 2016-07-04 DIAGNOSIS — Z888 Allergy status to other drugs, medicaments and biological substances status: Secondary | ICD-10-CM | POA: Diagnosis not present

## 2016-07-04 DIAGNOSIS — I4891 Unspecified atrial fibrillation: Secondary | ICD-10-CM | POA: Diagnosis present

## 2016-07-04 DIAGNOSIS — I483 Typical atrial flutter: Secondary | ICD-10-CM | POA: Diagnosis not present

## 2016-07-04 NOTE — Progress Notes (Signed)
Primary Care Physician: Walker Kehr, MD Referring Physician: [facility] triage Cardiologist: Dr. Chipper Herb is a 65 y.o. male with a h/o PAF with low afib burden on flecainide and atenolol for years. On last visit, he had resisted going on DOAC but did start on last visit there in April. He developed a fine rash on his legs and thought it may be from the anticoagulant, so stopped drug and went back on ASA. He woke this am with a fast rhythm. He has been taking Nugenix( OTC testosterone booster) lately and thought this may have contributed to issues with his heart rate. Last dose was yesterday. He has not had a major breakthrough episode of afib in years. Bp elevated today but at home has been running normal.  Today, he denies symptoms of  chest pain, shortness of breath, orthopnea, PND, lower extremity edema, dizziness, presyncope, syncope, or neurologic sequela. The patient is tolerating medications without difficulties and is otherwise without complaint today.   Past Medical History:  Diagnosis Date  . Depression   . HTN (hypertension)   . Hyperlipidemia   . Paroxysmal a-fib ([place])   . Prostate cancer [facility])    2010 Dr Terance Hart   . Rocky Mountain spotted fever    age 17  . S/P cardiac cath April 2012   No obstructive disease. Normal LV function  . Scoliosis   . TIA (transient ischemic attack)    2012  . Vitamin B 12 deficiency   . Weight gain    Past Surgical History:  Procedure Laterality Date  . CATARACT EXTRACTION W/ INTRAOCULAR LENS  IMPLANT, BILATERAL    . COLONOSCOPY  01-24-2005   TICS ONLY   . HERNIA REPAIR    . [place] and 2003   twice  . NASAL SEPTUM SURGERY    . PROSTATECTOMY  11/2008   Robotic     Current Outpatient Prescriptions  Medication Sig Dispense Refill  . ALPRAZolam (XANAX) 0.25 MG tablet Take 1 tablet (0.25 mg total) by mouth 2 (two) times daily as needed for anxiety. 60 tablet 1  . atenolol (TENORMIN) 50 MG tablet Take 1  tablet (50 mg total) by mouth daily. 90 tablet 3  . atorvastatin (LIPITOR) 10 MG tablet Take 1 tablet (10 mg total) by mouth daily. (Patient taking differently: Take 10 mg by mouth every other day. ) 90 tablet 3  . cholecalciferol (VITAMIN D) 1000 UNITS tablet Take 1 tablet (1,000 Units total) by mouth daily. 100 tablet 11  . Cyanocobalamin (NASCOBAL) 500 MCG/0.1ML SOLN USE 1 SPRAY IN 1 NOSTRIL   PER WEEK 3.9 mL 3  . Cyanocobalamin (VITAMIN B-12 SL) Place 1 tablet under the tongue daily. Use either spray or tablets    . flecainide (TAMBOCOR) 100 MG tablet TAKE 1 TABLET BY MOUTH TWICE DAILY 180 tablet 0  . ibuprofen (ADVIL,MOTRIN) 200 MG tablet Take 200-400 mg by mouth 2 (two) times daily as needed (pain).    . tadalafil (CIALIS) 10 MG tablet Take 10 mg by mouth daily as needed for erectile dysfunction.    . sertraline (ZOLOFT) 100 MG tablet Take 1 tablet (100 mg total) by mouth daily. (Patient not taking: Reported on 07/04/2016) 90 tablet 3  . vardenafil (LEVITRA) 20 MG tablet Take 1 tablet (20 mg total) by mouth daily as needed for erectile dysfunction. (Patient not taking: Reported on 07/04/2016) 10 tablet 0   No current facility-administered medications for this encounter.  Allergies  Allergen Reactions  . Procainamide Hcl Other (See Comments)     sun dermatitis  . Quinidine Other (See Comments)    High fever    Social History   Social History  . Marital status: Divorced    Spouse name: N/A  . Number of children: N/A  . Years of education: N/A   Occupational History  . Not on file.   Social History Main Topics  . Smoking status: Never Smoker  . Smokeless tobacco: Never Used  . Alcohol use 4.8 oz/week    4 Glasses of wine, 4 Shots of liquor per week     Comment: Social alcohol use  . Drug use: No  . Sexual activity: Not on file   Other Topics Concern  . Not on file   Social History Narrative   Regular Exercise -  YES    Family History  Problem Relation Age of  Onset  . Arrhythmia Mother     A-FIB  . Cancer Other     lung and prostate  . Cancer Father   . Colon cancer Paternal Uncle   . Rectal cancer Neg Hx   . Stomach cancer Neg Hx     ROS- All systems are reviewed and negative except as per the HPI above  Physical Exam: Vitals:   07/04/16 1329  BP: (!) 160/94  Pulse: (!) 108  Weight: 172 lb 12.8 oz (78.4 kg)  Height: 5' 8.5" (1.74 m)    GEN- The patient is well appearing, alert and oriented x 3 today.   Head- normocephalic, atraumatic Eyes-  Sclera clear, conjunctiva pink Ears- hearing intact Oropharynx- clear Neck- supple, no JVP Lymph- no cervical lymphadenopathy Lungs- Clear to ausculation bilaterally, normal work of breathing Heart- Regular, rapid rate and rhythm, no murmurs, rubs or gallops, PMI not laterally displaced GI- soft, NT, ND, + BS Extremities- no clubbing, cyanosis, or edema MS- no significant deformity or atrophy Skin- no rash or lesion Psych- euthymic mood, full affect Neuro- strength and sensation are intact  EKG- A flutter v rate of 108 bpm, pr int 196 ms, qrs int 128 ms, qtc 407 ms Epic records reviewed   Assessment and Plan: 1. PAF Stop nugenix Take an extra 50 mg flecainide when returns home( around 2pm) and take usual flecainide dose at dinnertime If still not in rhythm at bedtime, take 1/2 extra tab of atenolol Start back on eliquis for chadsvasc score of at least 3 per Dr. Jackalyn Lombard last note Take usual meds in the am  If not back in rhythm in am, he will call the office and will plan f to see him back in office.    Geroge Baseman Zubin Pontillo, [facility] [address] [place],  [address] [phone]

## 2016-07-04 NOTE — Telephone Encounter (Signed)
Pt states that since last night he has been on A-fib. Pt denies any other symptoms. Pt states he feels the palpitations. Pt took his Atenolol 50 mg and Flecainide 100 mg this AM. Pt states that he has been taken some kind of supplement medication for his prostate problem. He think that the supplement may have cause the palpitations.Pt  Is not able to take  his BP  because his BP machine needs new batteries. Pt's last O/V was 02/17/15. Pt would like to make an Appointment as soon as possible.  Pt has an appointment with Roderic Palau at [facility] clinic today at 1:30 PM pt is aware.

## 2016-07-04 NOTE — Telephone Encounter (Signed)
Evan Farmer is calling because he is experincing AFIB right now . It started on last night . Please call

## 2016-07-04 NOTE — Patient Instructions (Signed)
Your physician has recommended you make the following change in your medication: RESTART eliquis  Take an extra 50 mg of flecainide when you get home and if heart rate is still elevated at bedtime take an extra 1/2 tablet of metoprolol.  Please call us in the morning and let us know how you are doing.

## 2016-07-05 ENCOUNTER — Telehealth (HOSPITAL_COMMUNITY): Payer: Self-pay | Admitting: *Deleted

## 2016-07-05 NOTE — Telephone Encounter (Signed)
I cld pt to check and see if he has converted back into rhythm.  Pt stated that BP is 120/77 and his heart rate is 95 which is higher than he is used to and he still seems to "be picking up extra beats", so he is not sure that he has converted.  Per Roderic Palau, NP pt should take an extra 50 mg of flecainide at lunch today, and if still fluttering an extra 1/2 dose of atenolol tonight around supper.  Pt notified and understood and was also told to make sure he is taking his blood thinner.  Pt verbalized understanding and will call us tomorrow to let us know if this has worked.  If it has not, we will schedule him an appt for Monday.

## 2016-07-09 ENCOUNTER — Other Ambulatory Visit: Payer: Self-pay | Admitting: Internal Medicine

## 2016-07-09 ENCOUNTER — Other Ambulatory Visit: Payer: Self-pay | Admitting: *Deleted

## 2016-07-09 ENCOUNTER — Encounter (HOSPITAL_COMMUNITY): Payer: Self-pay | Admitting: Nurse Practitioner

## 2016-07-09 ENCOUNTER — Ambulatory Visit (HOSPITAL_COMMUNITY)
Admission: RE | Admit: 2016-07-09 | Discharge: 2016-07-09 | Disposition: A | Discharge status: Home / Self Care | Payer: 59 | Source: Ambulatory Visit | Attending: Nurse Practitioner | Admitting: Nurse Practitioner

## 2016-07-09 VITALS — BP 148/86 | HR 115 | Ht 68.5 in | Wt 171.2 lb

## 2016-07-09 DIAGNOSIS — Z8546 Personal history of malignant neoplasm of prostate: Secondary | ICD-10-CM | POA: Insufficient documentation

## 2016-07-09 DIAGNOSIS — E538 Deficiency of other specified B group vitamins: Secondary | ICD-10-CM | POA: Diagnosis not present

## 2016-07-09 DIAGNOSIS — I480 Paroxysmal atrial fibrillation: Secondary | ICD-10-CM

## 2016-07-09 DIAGNOSIS — I4892 Unspecified atrial flutter: Secondary | ICD-10-CM | POA: Insufficient documentation

## 2016-07-09 DIAGNOSIS — F329 Major depressive disorder, single episode, unspecified: Secondary | ICD-10-CM | POA: Insufficient documentation

## 2016-07-09 DIAGNOSIS — E785 Hyperlipidemia, unspecified: Secondary | ICD-10-CM | POA: Insufficient documentation

## 2016-07-09 DIAGNOSIS — Z8249 Family history of ischemic heart disease and other diseases of the circulatory system: Secondary | ICD-10-CM | POA: Diagnosis not present

## 2016-07-09 DIAGNOSIS — I10 Essential (primary) hypertension: Secondary | ICD-10-CM | POA: Insufficient documentation

## 2016-07-09 DIAGNOSIS — Z888 Allergy status to other drugs, medicaments and biological substances status: Secondary | ICD-10-CM | POA: Insufficient documentation

## 2016-07-09 DIAGNOSIS — Z8673 Personal history of transient ischemic attack (TIA), and cerebral infarction without residual deficits: Secondary | ICD-10-CM | POA: Insufficient documentation

## 2016-07-09 DIAGNOSIS — Z79899 Other long term (current) drug therapy: Secondary | ICD-10-CM | POA: Insufficient documentation

## 2016-07-09 DIAGNOSIS — I483 Typical atrial flutter: Secondary | ICD-10-CM | POA: Diagnosis not present

## 2016-07-09 LAB — CBC
HEMATOCRIT: 51.2 % (ref 39.0–52.0)
Hemoglobin: 17.2 g/dL — ABNORMAL HIGH (ref 13.0–17.0)
MCH: 33.5 pg (ref 26.0–34.0)
MCHC: 33.6 g/dL (ref 30.0–36.0)
MCV: 99.6 fL (ref 78.0–100.0)
PLATELETS: 178 10*3/uL (ref 150–400)
RBC: 5.14 MIL/uL (ref 4.22–5.81)
RDW: 12.9 % (ref 11.5–15.5)
WBC: 5.2 10*3/uL (ref 4.0–10.5)

## 2016-07-09 LAB — TSH: TSH: 0.816 u[IU]/mL (ref 0.350–4.500)

## 2016-07-09 LAB — BASIC METABOLIC PANEL
ANION GAP: 4 — AB (ref 5–15)
BUN: 17 mg/dL (ref 6–20)
CHLORIDE: 105 mmol/L (ref 101–111)
CO2: 30 mmol/L (ref 22–32)
Calcium: 9 mg/dL (ref 8.9–10.3)
Creatinine, Ser: 1.15 mg/dL (ref 0.61–1.24)
GFR calc non Af Amer: >60 mL/min (ref >60)
GLUCOSE: 106 mg/dL — AB (ref 65–99)
POTASSIUM: 4.7 mmol/L (ref 3.5–5.1)
Sodium: 139 mmol/L (ref 135–145)

## 2016-07-09 MED ORDER — CYANOCOBALAMIN 500 MCG/0.1ML NA SOLN: NASAL | 0 refills | Status: DC

## 2016-07-09 NOTE — Telephone Encounter (Signed)
Rx printed so faxed manually to [facility]...Evan Farmer

## 2016-07-09 NOTE — Patient Instructions (Addendum)
Cardioversion scheduled for Tuesday, August 29th  - Arrive at the [facility] and go to admitting at 8:30AM  -Do not eat or drink anything after midnight the night prior to your procedure.  - Take all your medication with a sip of water prior to arrival.  - You will not be able to drive home after your procedure.

## 2016-07-09 NOTE — Anesthesia Preprocedure Evaluation (Addendum)
Anesthesia Evaluation  Patient identified by MRN, date of birth, ID band Patient awake    Reviewed: Allergy & Precautions, NPO status , Patient's Chart, lab work & pertinent test results, reviewed documented beta blocker date and time   History of Anesthesia Complications Negative for: history of anesthetic complications  Airway Mallampati: III  TM Distance: >3 FB Neck ROM: Full    Dental  (+) Dental Advisory Given   Pulmonary neg pulmonary ROS, neg shortness of breath, neg sleep apnea, neg COPD, neg recent URI,    breath sounds clear to auscultation       Cardiovascular Exercise Tolerance: Good hypertension, Pt. on medications and Pt. on home beta blockers (-) angina(-) Past MI, (-) Cardiac Stents and (-) Orthopnea + dysrhythmias Atrial Fibrillation + Valvular Problems/Murmurs MR and AI  Rhythm:Irregular Rate:Normal  TTE 08/05/2012: Study Conclusions  - Left ventricle: The cavity size was normal. Wall thickness was normal. Systolic function was normal. The estimated ejection fraction was in the range of 55% to 60%. Wall motion was normal; there were no regional wall motion abnormalities. Left ventricular diastolic function parameters were normal. - Aortic valve: Mild regurgitation. - Mitral valve: Mild regurgitation. - Left atrium: The atrium was mildly dilated. - Pulmonary arteries: Systolic pressure was mildly increased. PA peak pressure: 65mm Hg (S).   Neuro/Psych PSYCHIATRIC DISORDERS Anxiety Depression TIA (2012)   GI/Hepatic negative GI ROS, Neg liver ROS,   Endo/Other  negative endocrine ROS  Renal/GU negative Renal ROS     Musculoskeletal scoliosis   Abdominal   Peds  Hematology negative hematology ROS (+)   Anesthesia Other Findings HLD, h/o prostate cancer, vitamin B12 deficiency  Reproductive/Obstetrics                            Anesthesia Physical Anesthesia  Plan  ASA: III  Anesthesia Plan: MAC   Post-op Pain Management:    Induction:   Airway Management Planned: Natural Airway and Nasal Cannula  Additional Equipment:   Intra-op Plan:   Post-operative Plan:   Informed Consent: I have reviewed the patients History and Physical, chart, labs and discussed the procedure including the risks, benefits and alternatives for the proposed anesthesia with the patient or authorized representative who has indicated his/her understanding and acceptance.   Dental advisory given  Plan Discussed with:   Anesthesia Plan Comments:        Anesthesia Quick Evaluation

## 2016-07-09 NOTE — Progress Notes (Signed)
Primary Care Physician: Walker Kehr, MD Referring Physician: [facility] triage Cardiologist: Dr. Chipper Herb is a 65 y.o. male with a h/o PAF with low afib burden on flecainide and atenolol for years. On last visit, with Dr. Rayann Heman, in April,  he had resisted going on DOAC but did start on last visit.Marland Kitchen He developed a fine rash on his legs and thought it may be from the anticoagulant, so stopped drug and went back on ASA, months ago. He woke last Wednesday, with a fast rhythm. He had been taking Nugenix( OTC testosterone booster) lately and thought this may have contributed to issues with his heart rate. Last dose was 8/22. He has not had a major breakthrough episode of afib in years. He has had 4 cardioversion's in the past,starting in 1998, last one was 2007. Flecainide has been keeping in rhythm.  He returns 8/28 and continues in aflutter. He took extra flecainide x two days and continues to take an extra 1/2 tab of atenolol in the pm without any effect. His heart rate will slow down to the 80's at home with rest. He is tolerating well. EKG reads Acute Stemi, probably secondary to flutter waves. He has not had any chest pain or shortness of breath, mostly some fatigue.   Today, he denies symptoms of  chest pain, shortness of breath, orthopnea, PND, lower extremity edema, dizziness, presyncope, syncope, or neurologic sequela. The patient is tolerating medications without difficulties and is otherwise without complaint today.   Past Medical History:  Diagnosis Date  . Depression   . HTN (hypertension)   . Hyperlipidemia   . Paroxysmal a-fib ([place])   . Prostate cancer [facility])    2010 Dr Terance Hart   . Rocky [place] spotted fever    age 76  . S/P cardiac cath April 2012   No obstructive disease. Normal LV function  . Scoliosis   . TIA (transient ischemic attack)    2012  . Vitamin B 12 deficiency   . Weight gain    Past Surgical History:  Procedure Laterality Date  . CATARACT  EXTRACTION W/ INTRAOCULAR LENS  IMPLANT, BILATERAL    . COLONOSCOPY  01-24-2005   TICS ONLY   . HERNIA REPAIR    . [place] and 2003   twice  . NASAL SEPTUM SURGERY    . PROSTATECTOMY  11/2008   Robotic     Current Outpatient Prescriptions  Medication Sig Dispense Refill  . ALPRAZolam (XANAX) 0.25 MG tablet Take 1 tablet (0.25 mg total) by mouth 2 (two) times daily as needed for anxiety. 60 tablet 1  . atenolol (TENORMIN) 50 MG tablet Take 1 tablet (50 mg total) by mouth daily. 90 tablet 3  . atorvastatin (LIPITOR) 10 MG tablet Take 1 tablet (10 mg total) by mouth daily. (Patient taking differently: Take 10 mg by mouth every other day. ) 90 tablet 3  . cholecalciferol (VITAMIN D) 1000 UNITS tablet Take 1 tablet (1,000 Units total) by mouth daily. 100 tablet 11  . Cyanocobalamin (NASCOBAL) 500 MCG/0.1ML SOLN USE 1 SPRAY IN 1 NOSTRIL   PER WEEK 3.9 mL 3  . Cyanocobalamin (VITAMIN B-12 SL) Place 1 tablet under the tongue daily. Use either spray or tablets    . flecainide (TAMBOCOR) 100 MG tablet TAKE 1 TABLET BY MOUTH TWICE DAILY 180 tablet 0  . ibuprofen (ADVIL,MOTRIN) 200 MG tablet Take 200-400 mg by mouth 2 (two) times daily as needed (pain).    Marland Kitchen  sertraline (ZOLOFT) 100 MG tablet Take 1 tablet (100 mg total) by mouth daily. 90 tablet 3  . tadalafil (CIALIS) 10 MG tablet Take 10 mg by mouth daily as needed for erectile dysfunction.    . vardenafil (LEVITRA) 20 MG tablet Take 1 tablet (20 mg total) by mouth daily as needed for erectile dysfunction. 10 tablet 0   No current facility-administered medications for this encounter.     Allergies  Allergen Reactions  . Procainamide Hcl Other (See Comments)     sun dermatitis  . Quinidine Other (See Comments)    High fever    Social History   Social History  . Marital status: Divorced    Spouse name: N/A  . Number of children: N/A  . Years of education: N/A   Occupational History  . Not on file.   Social History Main  Topics  . Smoking status: Never Smoker  . Smokeless tobacco: Never Used  . Alcohol use 4.8 oz/week    4 Glasses of wine, 4 Shots of liquor per week     Comment: Social alcohol use  . Drug use: No  . Sexual activity: Not on file   Other Topics Concern  . Not on file   Social History Narrative   Regular Exercise -  YES    Family History  Problem Relation Age of Onset  . Arrhythmia Mother     A-FIB  . Cancer Other     lung and prostate  . Cancer Father   . Colon cancer Paternal Uncle   . Rectal cancer Neg Hx   . Stomach cancer Neg Hx     ROS- All systems are reviewed and negative except as per the HPI above  Physical Exam: Vitals:   07/09/16 0850  BP: (!) 148/86  Pulse: (!) 115  Weight: 171 lb 3.2 oz (77.7 kg)  Height: 5' 8.5" (1.74 m)    GEN- The patient is well appearing, alert and oriented x 3 today.   Head- normocephalic, atraumatic Eyes-  Sclera clear, conjunctiva pink Ears- hearing intact Oropharynx- clear Neck- supple, no JVP Lymph- no cervical lymphadenopathy Lungs- Clear to ausculation bilaterally, normal work of breathing Heart- Regular, rapid rate and rhythm, no murmurs, rubs or gallops, PMI not laterally displaced GI- soft, NT, ND, + BS Extremities- no clubbing, cyanosis, or edema MS- no significant deformity or atrophy Skin- no rash or lesion Psych- euthymic mood, full affect Neuro- strength and sensation are intact  EKG- EKG reads acute stemi but does not fit with clinical picture, probably reading flutter waves, Atrial flutter with v rate of 115 bpm, QRS int 112 ms, qtc 387 ms Epic records reviewed    Assessment and Plan:  1. Atrial flutter Persistent since last 6 days, back on Eliquis since it started for chadsvasc score of 3(prior TIA, HTN) Will plan on TEE guided cardioversion for tomorrow Continue flecainide and atenolol  If fails cardioversion or ERAF, will need to discuss change in management.    Geroge Baseman [place], [facility] [address] [place], [place] [address] [phone]

## 2016-07-10 ENCOUNTER — Encounter (HOSPITAL_COMMUNITY)
Admission: RE | Disposition: A | Discharge status: Home / Self Care | Payer: Self-pay | Source: Ambulatory Visit | Attending: Cardiology

## 2016-07-10 ENCOUNTER — Ambulatory Visit (HOSPITAL_BASED_OUTPATIENT_CLINIC_OR_DEPARTMENT_OTHER)
Admission: RE | Admit: 2016-07-10 | Discharge: 2016-07-10 | Disposition: A | Discharge status: Home / Self Care | Payer: 59 | Source: Ambulatory Visit | Attending: Nurse Practitioner | Admitting: Nurse Practitioner

## 2016-07-10 ENCOUNTER — Ambulatory Visit (HOSPITAL_COMMUNITY): Payer: 59 | Admitting: Anesthesiology

## 2016-07-10 ENCOUNTER — Ambulatory Visit (HOSPITAL_COMMUNITY)
Admission: RE | Admit: 2016-07-10 | Discharge: 2016-07-10 | Disposition: A | Discharge status: Home / Self Care | Payer: 59 | Source: Ambulatory Visit | Attending: Cardiology | Admitting: Cardiology

## 2016-07-10 ENCOUNTER — Encounter (HOSPITAL_COMMUNITY): Payer: Self-pay

## 2016-07-10 DIAGNOSIS — I4892 Unspecified atrial flutter: Secondary | ICD-10-CM

## 2016-07-10 DIAGNOSIS — Z79899 Other long term (current) drug therapy: Secondary | ICD-10-CM | POA: Diagnosis not present

## 2016-07-10 DIAGNOSIS — Z8673 Personal history of transient ischemic attack (TIA), and cerebral infarction without residual deficits: Secondary | ICD-10-CM | POA: Diagnosis not present

## 2016-07-10 DIAGNOSIS — Z8546 Personal history of malignant neoplasm of prostate: Secondary | ICD-10-CM | POA: Insufficient documentation

## 2016-07-10 DIAGNOSIS — F329 Major depressive disorder, single episode, unspecified: Secondary | ICD-10-CM | POA: Insufficient documentation

## 2016-07-10 DIAGNOSIS — E538 Deficiency of other specified B group vitamins: Secondary | ICD-10-CM | POA: Diagnosis not present

## 2016-07-10 DIAGNOSIS — I340 Nonrheumatic mitral (valve) insufficiency: Secondary | ICD-10-CM

## 2016-07-10 DIAGNOSIS — I10 Essential (primary) hypertension: Secondary | ICD-10-CM | POA: Insufficient documentation

## 2016-07-10 DIAGNOSIS — I480 Paroxysmal atrial fibrillation: Secondary | ICD-10-CM | POA: Insufficient documentation

## 2016-07-10 HISTORY — PX: CARDIOVERSION: SHX1299

## 2016-07-10 HISTORY — PX: TEE WITHOUT CARDIOVERSION: SHX5443

## 2016-07-10 SURGERY — CARDIOVERSION
Anesthesia: Monitor Anesthesia Care

## 2016-07-10 MED ORDER — PROPOFOL 500 MG/50ML IV EMUL
INTRAVENOUS | Status: DC | PRN
  Administered 2016-07-10: 50 ug/kg/min via INTRAVENOUS

## 2016-07-10 MED ORDER — PROPOFOL 10 MG/ML IV BOLUS
INTRAVENOUS | Status: DC | PRN
  Administered 2016-07-10 (×3): 30 mg via INTRAVENOUS

## 2016-07-10 MED ORDER — APIXABAN 2.5 MG PO TABS: 5.0000 mg | ORAL_TABLET | Freq: Two times a day (BID) | ORAL | 6 refills | Status: DC

## 2016-07-10 MED ORDER — SODIUM CHLORIDE 0.9 % IV SOLN: INTRAVENOUS | Status: DC

## 2016-07-10 MED ORDER — LACTATED RINGERS IV SOLN
INTRAVENOUS | Status: DC
  Administered 2016-07-10: 10:00:00 via INTRAVENOUS
  Administered 2016-07-10: 1000 mL via INTRAVENOUS

## 2016-07-10 MED ORDER — BUTAMBEN-TETRACAINE-BENZOCAINE 2-2-14 % EX AERO
INHALATION_SPRAY | CUTANEOUS | Status: DC | PRN
Start: 2016-07-10 — End: 2016-07-10
  Administered 2016-07-10: 2 via TOPICAL

## 2016-07-10 NOTE — CV Procedure (Signed)
See full report in [employer] Patient sedated by anesthesia with diprovan 190 mg IV total. No LAA thrombus Patient subsequently had successful DCCV with 120 J to sinus rhythm No immediate complications Continue apixaban. Kirk Ruths

## 2016-07-10 NOTE — Transfer of Care (Signed)
Immediate Anesthesia Transfer of Care Note  Patient: Evan Farmer  Procedure(s) Performed: Procedure(s): CARDIOVERSION (N/A) TRANSESOPHAGEAL ECHOCARDIOGRAM (TEE) (N/A)  Patient Location: PACU  Anesthesia Type:MAC  Level of Consciousness: awake, alert , oriented and patient cooperative  Airway & Oxygen Therapy: Patient Spontanous Breathing and Patient connected to nasal cannula oxygen  Post-op Assessment: Report given to RN, Post -op Vital signs reviewed and stable and Patient moving all extremities  Post vital signs: Reviewed and stable  Last Vitals:  Vitals:   07/10/16 0854  BP: (!) 157/114  Pulse: (!) 117  Resp: 20  Temp: 36.6 C    Last Pain:  Vitals:   07/10/16 0854  TempSrc: Oral         Complications: No apparent anesthesia complications

## 2016-07-10 NOTE — Progress Notes (Signed)
  Echocardiogram Echocardiogram Transesophageal has been performed.  Tresa Res 07/10/2016, 12:01 PM

## 2016-07-10 NOTE — Discharge Instructions (Signed)
Transesophageal Echocardiogram Transesophageal echocardiography (TEE) is a picture test of your heart using sound waves. The pictures taken can give very detailed pictures of your heart. This can help your doctor see if there are problems with your heart. TEE can check:  If your heart has blood clots in it.  How well your heart valves are working.  If you have an infection on the inside of your heart.  Some of the major arteries of your heart.  If your heart valve is working after a Office manager.  Your heart before a procedure that uses a shock to your heart to get the rhythm back to normal. BEFORE THE PROCEDURE  Do not eat or drink for 6 hours before the procedure or as told by your doctor.  Make plans to have someone drive you home after the procedure. Do not drive yourself home.  An IV tube will be put in your arm. PROCEDURE  You will be given a medicine to help you relax (sedative). It will be given through the IV tube.  A numbing medicine will be sprayed or gargled in the back of your throat to help numb it.  The tip of the probe is placed into the back of your mouth. You will be asked to swallow. This helps to pass the probe into your esophagus.  Once the tip of the probe is in the right place, your doctor can take pictures of your heart.  You may feel pressure at the back of your throat. AFTER THE PROCEDURE  You will be taken to a recovery area so the sedative can wear off.  Your throat may be sore and scratchy. This will go away slowly over time.  You will go home when you are fully awake and able to swallow liquids.  You should have someone stay with you for the next 24 hours.  Do not drive or operate machinery for the next 24 hours.   This information is not intended to replace advice given to you by your health care provider. Make sure you discuss any questions you have with your health care provider.   Document Released: 08/26/2009 Document Revised: 11/03/2013  Document Reviewed: 04/30/2013 Elsevier Interactive Patient Education 2016 [employer].  Electrical Cardioversion, Care After Refer to this sheet in the next few weeks. These instructions provide you with information on caring for yourself after your procedure. Your health care provider may also give you more specific instructions. Your treatment has been planned according to current medical practices, but problems sometimes occur. Call your health care provider if you have any problems or questions after your procedure. WHAT TO EXPECT AFTER THE PROCEDURE After your procedure, it is typical to have the following sensations:  Some redness on the skin where the shocks were delivered. If this is tender, a sunburn lotion or hydrocortisone cream may help.  Possible return of an abnormal heart rhythm within hours or days after the procedure. HOME CARE INSTRUCTIONS  Take medicines only as directed by your health care provider. Be sure you understand how and when to take your medicine.  Learn how to feel your pulse and check it often.  Limit your activity for 48 hours after the procedure or as directed by your health care provider.  Avoid or minimize caffeine and other stimulants as directed by your health care provider. SEEK MEDICAL CARE IF:  You feel like your heart is beating too fast or your pulse is not regular.  You have any questions about your medicines.  You have bleeding that will not stop. SEEK IMMEDIATE MEDICAL CARE IF:  You are dizzy or feel faint.  It is hard to breathe or you feel short of breath.  There is a change in discomfort in your chest.  Your speech is slurred or you have trouble moving an arm or leg on one side of your body.  You get a serious muscle cramp that does not go away.  Your fingers or toes turn cold or blue.   This information is not intended to replace advice given to you by your health care provider. Make sure you discuss any questions you have  with your health care provider.   Document Released: 08/19/2013 Document Revised: 11/19/2014 Document Reviewed: 08/19/2013 Elsevier Interactive Patient Education [employer].

## 2016-07-10 NOTE — H&P (Signed)
Progress Notes Date of Service: 07/09/2016 9:26 AM Sherran Needs, NP  Electrophysiology    [] Hide copied text     Primary Care Physician: Walker Kehr, MD Referring Physician: [facility] triage Cardiologist: Dr. Chipper Herb is a 65 y.o. male with a h/o PAF with low afib burden on flecainide and atenolol for years. On last visit, with Dr. Rayann Heman, in April,  he had resisted going on DOAC but did start on last visit.Marland Kitchen He developed a fine rash on his legs and thought it may be from the anticoagulant, so stopped drug and went back on ASA, months ago. He woke last Wednesday, with a fast rhythm. He had been taking Nugenix( OTC testosterone booster) lately and thought this may have contributed to issues with his heart rate. Last dose was 8/22. He has not had a major breakthrough episode of afib in years. He has had 4 cardioversion's in the past,starting in 1998, last one was 2007. Flecainide has been keeping in rhythm.  He returns 8/28 and continues in aflutter. He took extra flecainide x two days and continues to take an extra 1/2 tab of atenolol in the pm without any effect. His heart rate will slow down to the 80's at home with rest. He is tolerating well. EKG reads Acute Stemi, probably secondary to flutter waves. He has not had any chest pain or shortness of breath, mostly some fatigue.   Today, he denies symptoms of  chest pain, shortness of breath, orthopnea, PND, lower extremity edema, dizziness, presyncope, syncope, or neurologic sequela. The patient is tolerating medications without difficulties and is otherwise without complaint today.       Past Medical History:  Diagnosis Date  . Depression   . HTN (hypertension)   . Hyperlipidemia   . Paroxysmal a-fib ([place])   . Prostate cancer [facility])    2010 Dr Terance Hart   . Rocky Mountain spotted fever    age 44  . S/P cardiac cath April 2012   No obstructive disease. Normal LV function  . Scoliosis   . TIA (transient  ischemic attack)    2012  . Vitamin B 12 deficiency   . Weight gain         Past Surgical History:  Procedure Laterality Date  . CATARACT EXTRACTION W/ INTRAOCULAR LENS  IMPLANT, BILATERAL    . COLONOSCOPY  01-24-2005   TICS ONLY   . HERNIA REPAIR    . [place] and 2003   twice  . NASAL SEPTUM SURGERY    . PROSTATECTOMY  11/2008   Robotic           Current Outpatient Prescriptions  Medication Sig Dispense Refill  . ALPRAZolam (XANAX) 0.25 MG tablet Take 1 tablet (0.25 mg total) by mouth 2 (two) times daily as needed for anxiety. 60 tablet 1  . atenolol (TENORMIN) 50 MG tablet Take 1 tablet (50 mg total) by mouth daily. 90 tablet 3  . atorvastatin (LIPITOR) 10 MG tablet Take 1 tablet (10 mg total) by mouth daily. (Patient taking differently: Take 10 mg by mouth every other day. ) 90 tablet 3  . cholecalciferol (VITAMIN D) 1000 UNITS tablet Take 1 tablet (1,000 Units total) by mouth daily. 100 tablet 11  . Cyanocobalamin (NASCOBAL) 500 MCG/0.1ML SOLN USE 1 SPRAY IN 1 NOSTRIL   PER WEEK 3.9 mL 3  . Cyanocobalamin (VITAMIN B-12 SL) Place 1 tablet under the tongue daily. Use either spray or tablets    .  flecainide (TAMBOCOR) 100 MG tablet TAKE 1 TABLET BY MOUTH TWICE DAILY 180 tablet 0  . ibuprofen (ADVIL,MOTRIN) 200 MG tablet Take 200-400 mg by mouth 2 (two) times daily as needed (pain).    Marland Kitchen sertraline (ZOLOFT) 100 MG tablet Take 1 tablet (100 mg total) by mouth daily. 90 tablet 3  . tadalafil (CIALIS) 10 MG tablet Take 10 mg by mouth daily as needed for erectile dysfunction.    . vardenafil (LEVITRA) 20 MG tablet Take 1 tablet (20 mg total) by mouth daily as needed for erectile dysfunction. 10 tablet 0   No current facility-administered medications for this encounter.          Allergies  Allergen Reactions  . Procainamide Hcl Other (See Comments)     sun dermatitis  . Quinidine Other (See Comments)    High fever    Social History          Social History  . Marital status: Divorced    Spouse name: N/A  . Number of children: N/A  . Years of education: N/A      Occupational History  . Not on file.         Social History Main Topics  . Smoking status: Never Smoker  . Smokeless tobacco: Never Used  . Alcohol use 4.8 oz/week    4 Glasses of wine, 4 Shots of liquor per week     Comment: Social alcohol use  . Drug use: No  . Sexual activity: Not on file       Other Topics Concern  . Not on file      Social History Narrative   Regular Exercise -  YES          Family History  Problem Relation Age of Onset  . Arrhythmia Mother     A-FIB  . Cancer Other     lung and prostate  . Cancer Father   . Colon cancer Paternal Uncle   . Rectal cancer Neg Hx   . Stomach cancer Neg Hx     ROS- All systems are reviewed and negative except as per the HPI above  Physical Exam:    Vitals:   07/09/16 0850  BP: (!) 148/86  Pulse: (!) 115  Weight: 171 lb 3.2 oz (77.7 kg)  Height: 5' 8.5" (1.74 m)    GEN- The patient is well appearing, alert and oriented x 3 today.   Head- normocephalic, atraumatic Eyes-  Sclera clear, conjunctiva pink Ears- hearing intact Oropharynx- clear Neck- supple, no JVP Lymph- no cervical lymphadenopathy Lungs- Clear to ausculation bilaterally, normal work of breathing Heart- Regular, rapid rate and rhythm, no murmurs, rubs or gallops, PMI not laterally displaced GI- soft, NT, ND, + BS Extremities- no clubbing, cyanosis, or edema MS- no significant deformity or atrophy Skin- no rash or lesion Psych- euthymic mood, full affect Neuro- strength and sensation are intact  EKG- EKG reads acute stemi but does not fit with clinical picture, probably reading flutter waves, Atrial flutter with v rate of 115 bpm, QRS int 112 ms, qtc 387 ms Epic records reviewed    Assessment and Plan:  1. Atrial flutter Persistent since last 6 days, back on Eliquis  since it started for chadsvasc score of 3(prior TIA, HTN) Will plan on TEE guided cardioversion for tomorrow Continue flecainide and atenolol  If fails cardioversion or ERAF, will need to discuss change in management.    Geroge Baseman Carroll, [facility] [address]  [place], [place] [address] [phone]     For TEE/DCCV; no changes Kirk Ruths

## 2016-07-10 NOTE — Anesthesia Postprocedure Evaluation (Signed)
Anesthesia Post Note  Patient: Evan Farmer  Procedure(s) Performed: Procedure(s) (LRB): CARDIOVERSION (N/A) TRANSESOPHAGEAL ECHOCARDIOGRAM (TEE) (N/A)  Patient location during evaluation: PACU Anesthesia Type: MAC Level of consciousness: awake and alert Pain management: pain level controlled Vital Signs Assessment: post-procedure vital signs reviewed and stable Respiratory status: spontaneous breathing, nonlabored ventilation, respiratory function stable and patient connected to nasal cannula oxygen Cardiovascular status: stable and blood pressure returned to baseline Anesthetic complications: no    Last Vitals:  Vitals:   07/10/16 1039 07/10/16 1050  BP: 108/75 113/79  Pulse: 67 64  Resp: 17 19  Temp:      Last Pain:  Vitals:   07/10/16 0854  TempSrc: Oral                 [name]

## 2016-07-11 NOTE — Telephone Encounter (Signed)
apixaban (ELIQUIS) 2.5 MG TABS tablet  Medication  Date: 07/10/2016 Department: [facility] ENDOSCOPY Ordering/Authorizing: Lelon Perla, MD  Order Providers   Prescribing Provider Encounter Provider  Lelon Perla, MD None  Medication Detail    Disp Refills Start End   apixaban (ELIQUIS) 2.5 MG TABS tablet 60 tablet 6 07/10/2016    Sig - Route: Take 2 tablets (5 mg total) by mouth 2 (two) times daily. - Oral   E-Prescribing Status: Receipt confirmed by pharmacy (07/10/2016 10:32 AM EDT)   Pharmacy   [facility], [place]

## 2016-07-13 ENCOUNTER — Telehealth: Payer: Self-pay | Admitting: *Deleted

## 2016-07-13 NOTE — Telephone Encounter (Signed)
[facility] called and requested an rx for eliquis 5 mg. An rx was sent in on 07/10/16 by Dr Stanford Breed for the 2.5mg  with a sig of take two tablets by mouth bid which they state they did not receive. Please advise. Thanks, MI

## 2016-07-17 ENCOUNTER — Ambulatory Visit (HOSPITAL_COMMUNITY)
Admission: RE | Admit: 2016-07-17 | Discharge: 2016-07-17 | Disposition: A | Discharge status: Home / Self Care | Payer: 59 | Source: Ambulatory Visit | Attending: Nurse Practitioner | Admitting: Nurse Practitioner

## 2016-07-17 ENCOUNTER — Encounter (HOSPITAL_COMMUNITY): Payer: Self-pay | Admitting: Nurse Practitioner

## 2016-07-17 VITALS — BP 144/90 | HR 52 | Ht 68.5 in | Wt 173.6 lb

## 2016-07-17 DIAGNOSIS — E538 Deficiency of other specified B group vitamins: Secondary | ICD-10-CM | POA: Insufficient documentation

## 2016-07-17 DIAGNOSIS — Z888 Allergy status to other drugs, medicaments and biological substances status: Secondary | ICD-10-CM | POA: Insufficient documentation

## 2016-07-17 DIAGNOSIS — I4892 Unspecified atrial flutter: Secondary | ICD-10-CM | POA: Diagnosis not present

## 2016-07-17 DIAGNOSIS — I483 Typical atrial flutter: Secondary | ICD-10-CM

## 2016-07-17 DIAGNOSIS — M419 Scoliosis, unspecified: Secondary | ICD-10-CM | POA: Diagnosis not present

## 2016-07-17 DIAGNOSIS — E785 Hyperlipidemia, unspecified: Secondary | ICD-10-CM | POA: Diagnosis not present

## 2016-07-17 DIAGNOSIS — Z79899 Other long term (current) drug therapy: Secondary | ICD-10-CM | POA: Diagnosis not present

## 2016-07-17 DIAGNOSIS — F329 Major depressive disorder, single episode, unspecified: Secondary | ICD-10-CM | POA: Insufficient documentation

## 2016-07-17 DIAGNOSIS — I10 Essential (primary) hypertension: Secondary | ICD-10-CM | POA: Insufficient documentation

## 2016-07-17 DIAGNOSIS — Z8673 Personal history of transient ischemic attack (TIA), and cerebral infarction without residual deficits: Secondary | ICD-10-CM | POA: Insufficient documentation

## 2016-07-17 DIAGNOSIS — Z7901 Long term (current) use of anticoagulants: Secondary | ICD-10-CM | POA: Diagnosis not present

## 2016-07-17 DIAGNOSIS — Z8546 Personal history of malignant neoplasm of prostate: Secondary | ICD-10-CM | POA: Diagnosis not present

## 2016-07-17 DIAGNOSIS — I4891 Unspecified atrial fibrillation: Secondary | ICD-10-CM | POA: Diagnosis present

## 2016-07-17 MED ORDER — APIXABAN 5 MG PO TABS: 5.0000 mg | ORAL_TABLET | Freq: Two times a day (BID) | ORAL | 6 refills | Status: DC

## 2016-07-17 NOTE — Progress Notes (Signed)
Primary Care Physician: Walker Kehr, MD Referring Physician: [facility] triage Cardiologist: Dr. Chipper Herb is a 65 y.o. male with a h/o PAF with low afib burden on flecainide and atenolol for years. On last visit, with Dr. Rayann Heman, in April,  he had resisted going on DOAC but did start on last visit. He developed a fine rash on his legs and thought it may be from the anticoagulant, so stopped drug and went back on ASA, months ago. He woke last Wednesday, with a fast rhythm. He had been taking Nugenix( OTC testosterone booster) lately and thought this may have contributed to issues with his heart rate. Last dose was 8/22. He has not had a major breakthrough episode of afib in years. He has had 4 cardioversion's in the past,starting in 1998, last one was 2007. Flecainide has been keeping in rhythm.  He returns 8/28 and continues in aflutter. He took extra flecainide x two days and continues to take an extra 1/2 tab of atenolol in the pm without any effect. His heart rate will slow down to the 80's at home with rest. He is tolerating well. EKG reads Acute Stemi, probably secondary to flutter waves. He has not had any chest pain or shortness of breath, mostly some fatigue.   Returns 8/5, after successful cardioversion. He is in SR at 55 bpm. Feels well, has not noticed any irregular heart beat. Reminded to contiue Eliquis 5 mg bid.  Today, he denies symptoms of  chest pain, shortness of breath, orthopnea, PND, lower extremity edema, dizziness, presyncope, syncope, or neurologic sequela. The patient is tolerating medications without difficulties and is otherwise without complaint today.   Past Medical History:  Diagnosis Date  . Depression   . HTN (hypertension)   . Hyperlipidemia   . Paroxysmal a-fib ([place])   . Prostate cancer [facility])    2010 Dr Terance Hart   . Rocky Mountain spotted fever    age 77  . S/P cardiac cath April 2012   No obstructive disease. Normal LV function  .  Scoliosis   . TIA (transient ischemic attack)    2012  . Vitamin B 12 deficiency   . Weight gain    Past Surgical History:  Procedure Laterality Date  . CARDIOVERSION N/A 07/10/2016   Procedure: CARDIOVERSION;  Surgeon: Lelon Perla, MD;  Location: [facility] ENDOSCOPY;  Service: Cardiovascular;  Laterality: N/A;  . CATARACT EXTRACTION W/ INTRAOCULAR LENS  IMPLANT, BILATERAL    . COLONOSCOPY  01-24-2005   TICS ONLY   . HERNIA REPAIR    . [place] and 2003   twice  . NASAL SEPTUM SURGERY    . PROSTATECTOMY  11/2008   Robotic   . TEE WITHOUT CARDIOVERSION N/A 07/10/2016   Procedure: TRANSESOPHAGEAL ECHOCARDIOGRAM (TEE);  Surgeon: Lelon Perla, MD;  Location[facility] ENDOSCOPY;  Service: Cardiovascular;  Laterality: N/A;    Current Outpatient Prescriptions  Medication Sig Dispense Refill  . ALPRAZolam (XANAX) 0.25 MG tablet Take 1 tablet (0.25 mg total) by mouth 2 (two) times daily as needed for anxiety. 60 tablet 1  . atenolol (TENORMIN) 50 MG tablet Take 1 tablet (50 mg total) by mouth daily. 90 tablet 3  . cholecalciferol (VITAMIN D) 1000 UNITS tablet Take 1 tablet (1,000 Units total) by mouth daily. 100 tablet 11  . Cyanocobalamin (NASCOBAL) 500 MCG/0.1ML SOLN USE 1 SPRAY IN 1 NOSTRIL   PER WEEK Yearly physical due in Oct must see MD  for refills 3.9 mL 0  . Cyanocobalamin (VITAMIN B-12 SL) Place 1 tablet under the tongue daily. Use either spray or tablets    . flecainide (TAMBOCOR) 100 MG tablet TAKE 1 TABLET BY MOUTH TWICE DAILY 180 tablet 0  . ibuprofen (ADVIL,MOTRIN) 200 MG tablet Take 200-400 mg by mouth 2 (two) times daily as needed (pain).    Marland Kitchen sertraline (ZOLOFT) 100 MG tablet Take 1 tablet (100 mg total) by mouth daily. Yearly physical due in Oct must see Md for refills 90 tablet 0  . tadalafil (CIALIS) 10 MG tablet Take 10 mg by mouth daily as needed for erectile dysfunction.    . vardenafil (LEVITRA) 20 MG tablet Take 1 tablet (20 mg total) by mouth daily as needed for  erectile dysfunction. 10 tablet 0  . apixaban (ELIQUIS) 5 MG TABS tablet Take 1 tablet (5 mg total) by mouth 2 (two) times daily. 60 tablet 6   No current facility-administered medications for this encounter.     Allergies  Allergen Reactions  . Procainamide Hcl Other (See Comments)     sun dermatitis  . Quinidine Other (See Comments)    High fever    Social History   Social History  . Marital status: Divorced    Spouse name: N/A  . Number of children: N/A  . Years of education: N/A   Occupational History  . Not on file.   Social History Main Topics  . Smoking status: Never Smoker  . Smokeless tobacco: Never Used  . Alcohol use 4.8 oz/week    4 Glasses of wine, 4 Shots of liquor per week     Comment: Social alcohol use  . Drug use: No  . Sexual activity: Not on file   Other Topics Concern  . Not on file   Social History Narrative   Regular Exercise -  YES    Family History  Problem Relation Age of Onset  . Arrhythmia Mother     A-FIB  . Cancer Father   . Cancer Other     lung and prostate  . Colon cancer Paternal Uncle   . Rectal cancer Neg Hx   . Stomach cancer Neg Hx     ROS- All systems are reviewed and negative except as per the HPI above  Physical Exam: Vitals:   07/17/16 0920  BP: (!) 144/90  Pulse: (!) 52  Weight: 173 lb 9.6 oz (78.7 kg)  Height: 5' 8.5" (1.74 m)    GEN- The patient is well appearing, alert and oriented x 3 today.   Head- normocephalic, atraumatic Eyes-  Sclera clear, conjunctiva pink Ears- hearing intact Oropharynx- clear Neck- supple, no JVP Lymph- no cervical lymphadenopathy Lungs- Clear to ausculation bilaterally, normal work of breathing Heart- Regular, rapid rate and rhythm, no murmurs, rubs or gallops, PMI not laterally displaced GI- soft, NT, ND, + BS Extremities- no clubbing, cyanosis, or edema MS- no significant deformity or atrophy Skin- no rash or lesion Psych- euthymic mood, full affect Neuro- strength  and sensation are intact  EKG- EKG shows S brady at 55 bpm, pr int 190 ms, QRS int 112 ms, Qtc 399 ms Epic records reviewed    Assessment and Plan:  1. Atrial flutter Successful cardioversion Back on Eliquis 5 mg bid for chadsvasc score of 3(prior TIA, HTN), reminded to continue to take on a regular basis Continue flecainide and atenolol   F/u with Dr. Rayann Heman as scheduled 9/25    Geroge Baseman. Kayleen Memos, ANP-C Afib  [facility] [address] [place], [place] [address] [phone]

## 2016-07-17 NOTE — Telephone Encounter (Signed)
Roderic Palau, NP sent in refill

## 2016-07-18 ENCOUNTER — Telehealth: Payer: Self-pay | Admitting: *Deleted

## 2016-07-18 NOTE — Telephone Encounter (Signed)
Rec'd fax pt requesting 90 day supply on Xanax 0.25 mg...[name]

## 2016-07-18 NOTE — Telephone Encounter (Signed)
OK to fill this prescription with additional refills x1 Thank you!  

## 2016-07-19 MED ORDER — ALPRAZOLAM 0.25 MG PO TABS: 0.2500 mg | ORAL_TABLET | Freq: Two times a day (BID) | ORAL | 1 refills | Status: DC | PRN

## 2016-07-19 NOTE — Telephone Encounter (Signed)
Printed script MD sign has been faxed to [facility]...[name]

## 2016-07-23 ENCOUNTER — Encounter: Payer: Self-pay | Admitting: *Deleted

## 2016-07-26 ENCOUNTER — Other Ambulatory Visit: Payer: Self-pay | Admitting: *Deleted

## 2016-07-26 MED ORDER — APIXABAN 5 MG PO TABS: 5.0000 mg | ORAL_TABLET | Freq: Two times a day (BID) | ORAL | 3 refills | Status: DC

## 2016-08-06 ENCOUNTER — Ambulatory Visit (INDEPENDENT_AMBULATORY_CARE_PROVIDER_SITE_OTHER): Payer: 59 | Admitting: Internal Medicine

## 2016-08-06 ENCOUNTER — Encounter: Payer: Self-pay | Admitting: Internal Medicine

## 2016-08-06 VITALS — BP 126/78 | HR 59 | Ht 69.0 in | Wt 171.8 lb

## 2016-08-06 DIAGNOSIS — I10 Essential (primary) hypertension: Secondary | ICD-10-CM

## 2016-08-06 DIAGNOSIS — I480 Paroxysmal atrial fibrillation: Secondary | ICD-10-CM

## 2016-08-06 DIAGNOSIS — I483 Typical atrial flutter: Secondary | ICD-10-CM | POA: Diagnosis not present

## 2016-08-06 NOTE — Patient Instructions (Signed)
Medication Instructions:  Your physician recommends that you continue on your current medications as directed. Please refer to the Current Medication list given to you today.   Labwork: None ordered   Testing/Procedures: None ordered   Follow-Up: Your physician wants you to follow-up in: 6 months with Donna Carroll, NP You will receive a reminder letter in the mail two months in advance. If you don't receive a letter, please call our office to schedule the follow-up appointment.   Any Other Special Instructions Will Be Listed Below (If Applicable).     If you need a refill on your cardiac medications before your next appointment, please call your pharmacy.   

## 2016-08-06 NOTE — Progress Notes (Signed)
PCP: Walker Kehr, MD Primary Cardiologist:  Previously Dr Caryl Asp is a 65 y.o. male who presents today for cardiology followup.   He has a h/o paroxysmal atrial fibrillation which has been well controlled for several years with flecainide.  He did well until August when he presented with atrial flutter (typical).  He was restarted on eliquis and underwent cardioversion.  HE has done well wince that time.   Today, he denies symptoms of palpitations, chest pain, shortness of breath,  lower extremity edema, dizziness, presyncope, or syncope.   He snores.  The patient is otherwise without complaint today.    Past Medical History:  Diagnosis Date  . Depression   . HTN (hypertension)   . Hyperlipidemia   . Paroxysmal a-fib ([place])   . Prostate cancer [facility])    2010 Dr Terance Hart   . Rocky Mountain spotted fever    age 84  . S/P cardiac cath April 2012   No obstructive disease. Normal LV function  . Scoliosis   . TIA (transient ischemic attack)    2012  . Vitamin B 12 deficiency   . Weight gain    Past Surgical History:  Procedure Laterality Date  . CARDIOVERSION N/A 07/10/2016   Procedure: CARDIOVERSION;  Surgeon: Lelon Perla, MD;  Location: [facility] ENDOSCOPY;  Service: Cardiovascular;  Laterality: N/A;  . CATARACT EXTRACTION W/ INTRAOCULAR LENS  IMPLANT, BILATERAL    . COLONOSCOPY  01-24-2005   TICS ONLY   . HERNIA REPAIR    . [place] and 2003   twice  . NASAL SEPTUM SURGERY    . PROSTATECTOMY  11/2008   Robotic   . TEE WITHOUT CARDIOVERSION N/A 07/10/2016   Procedure: TRANSESOPHAGEAL ECHOCARDIOGRAM (TEE);  Surgeon: Lelon Perla, MD;  Location: [facility] ENDOSCOPY;  Service: Cardiovascular;  Laterality: N/A;    Current Outpatient Prescriptions  Medication Sig Dispense Refill  . ALPRAZolam (XANAX) 0.25 MG tablet Take 1 tablet (0.25 mg total) by mouth 2 (two) times daily as needed for anxiety. 180 tablet 1  . apixaban (ELIQUIS) 5 MG TABS tablet Take 1 tablet (5  mg total) by mouth 2 (two) times daily. 180 tablet 3  . atenolol (TENORMIN) 50 MG tablet Take 1 tablet (50 mg total) by mouth daily. 90 tablet 3  . cholecalciferol (VITAMIN D) 1000 UNITS tablet Take 1 tablet (1,000 Units total) by mouth daily. 100 tablet 11  . Cyanocobalamin (NASCOBAL) 500 MCG/0.1ML SOLN USE 1 SPRAY IN 1 NOSTRIL   PER WEEK Yearly physical due in Oct must see MD for refills 3.9 mL 0  . flecainide (TAMBOCOR) 100 MG tablet TAKE 1 TABLET BY MOUTH TWICE DAILY 180 tablet 0  . ibuprofen (ADVIL,MOTRIN) 200 MG tablet Take 200-400 mg by mouth 2 (two) times daily as needed (pain).    Marland Kitchen sertraline (ZOLOFT) 100 MG tablet Take 1 tablet (100 mg total) by mouth daily. Yearly physical due in Oct must see Md for refills 90 tablet 0  . tadalafil (CIALIS) 10 MG tablet Take 10 mg by mouth daily as needed for erectile dysfunction.    . vardenafil (LEVITRA) 20 MG tablet Take 1 tablet (20 mg total) by mouth daily as needed for erectile dysfunction. 10 tablet 0   No current facility-administered medications for this visit.    ROS- all systems are reviewed and negative except as per HPI above  Physical Exam: Vitals:   08/06/16 1559  BP: 126/78  Pulse: (!) 59  Weight: 171  lb 12.8 oz (77.9 kg)  Height: 5\' 9"  (1.753 m)    GEN- The patient is well appearing, alert and oriented x 3 today.   Head- normocephalic, atraumatic Eyes-  Sclera clear, conjunctiva pink Ears- hearing intact Oropharynx- clear Lungs- Clear to ausculation bilaterally, normal work of breathing Heart- Regular rate and rhythm, no murmurs, rubs or gallops, PMI not laterally displaced GI- soft, NT, ND, + BS Extremities- no clubbing, cyanosis, or edema  ekg today reveals sinus rhythm 59 bpm, otherwise normal ekg ekg 8/17 reveals typical atrial flutter with 2:1 AV conduction TEE 07/10/16 reveals EF 55-60%, mild AI, MR, and TR, moderate LAE  Assessment and Plan:  1. Afib/ atrial flutter Reasonably well controlled (except for 1  recent break through event).  He wishes to continue current medicine strategy. His chads2vasc score is at least 3.   Continue eliquis long term.  2. HTN Stable No change required today  3. HL Stable No change required today  Return to see Butch Penny in the AF clinic in 6 months  Thompson Grayer MD, [facility] 08/06/2016 4:15 PM

## 2016-08-28 ENCOUNTER — Encounter: Payer: 59 | Admitting: Internal Medicine

## 2016-09-10 ENCOUNTER — Telehealth: Payer: Self-pay | Admitting: Internal Medicine

## 2016-09-10 MED ORDER — FLECAINIDE ACETATE 100 MG PO TABS: 100.0000 mg | ORAL_TABLET | Freq: Two times a day (BID) | ORAL | 0 refills | Status: DC

## 2016-09-10 MED ORDER — FLECAINIDE ACETATE 100 MG PO TABS: 100.0000 mg | ORAL_TABLET | Freq: Two times a day (BID) | ORAL | 3 refills | Status: DC

## 2016-09-10 NOTE — Telephone Encounter (Signed)
Pt's Rx was sent to pt's pharmacy as requested. Conformation received.

## 2016-09-10 NOTE — Telephone Encounter (Signed)
°*  STAT* If patient is at the pharmacy, call can be transferred to refill team.   1. Which medications need to be refilled? (please list name of each medication and dose if known) fleconide  100mg  2. Which pharmacy/location (including street and city if local pharmacy) is medication to be sent to? [facility] on [address]  3. Do they need a 30 day or 90 day supply? 10 day supply until mail order comes in

## 2016-09-25 ENCOUNTER — Ambulatory Visit (INDEPENDENT_AMBULATORY_CARE_PROVIDER_SITE_OTHER): Payer: 59 | Admitting: Internal Medicine

## 2016-09-25 ENCOUNTER — Other Ambulatory Visit (INDEPENDENT_AMBULATORY_CARE_PROVIDER_SITE_OTHER): Payer: 59

## 2016-09-25 ENCOUNTER — Encounter: Payer: Self-pay | Admitting: Internal Medicine

## 2016-09-25 VITALS — BP 139/89 | HR 64 | Ht 68.5 in | Wt 176.0 lb

## 2016-09-25 DIAGNOSIS — N529 Male erectile dysfunction, unspecified: Secondary | ICD-10-CM | POA: Diagnosis not present

## 2016-09-25 DIAGNOSIS — E538 Deficiency of other specified B group vitamins: Secondary | ICD-10-CM

## 2016-09-25 DIAGNOSIS — I480 Paroxysmal atrial fibrillation: Secondary | ICD-10-CM

## 2016-09-25 DIAGNOSIS — Z0000 Encounter for general adult medical examination without abnormal findings: Secondary | ICD-10-CM

## 2016-09-25 DIAGNOSIS — Z23 Encounter for immunization: Secondary | ICD-10-CM

## 2016-09-25 DIAGNOSIS — G459 Transient cerebral ischemic attack, unspecified: Secondary | ICD-10-CM | POA: Diagnosis not present

## 2016-09-25 LAB — BASIC METABOLIC PANEL
BUN: 17 mg/dL (ref 6–23)
CHLORIDE: 104 meq/L (ref 96–112)
CO2: 32 mEq/L (ref 19–32)
Calcium: 8.7 mg/dL (ref 8.4–10.5)
Creatinine, Ser: 1.18 mg/dL (ref 0.40–1.50)
GFR: 66.1 mL/min (ref >60.00)
GLUCOSE: 96 mg/dL (ref 70–99)
POTASSIUM: 4.5 meq/L (ref 3.5–5.1)
Sodium: 140 mEq/L (ref 135–145)

## 2016-09-25 LAB — CBC WITH DIFFERENTIAL/PLATELET
BASOS PCT: 0.2 % (ref 0.0–3.0)
Basophils Absolute: 0 10*3/uL (ref 0.0–0.1)
EOS PCT: 0.9 % (ref 0.0–5.0)
Eosinophils Absolute: 0 10*3/uL (ref 0.0–0.7)
HCT: 45.5 % (ref 39.0–52.0)
HEMOGLOBIN: 15.4 g/dL (ref 13.0–17.0)
Lymphocytes Relative: 17.6 % (ref 12.0–46.0)
Lymphs Abs: 0.8 10*3/uL (ref 0.7–4.0)
MCHC: 33.9 g/dL (ref 30.0–36.0)
MCV: 95.8 fl (ref 78.0–100.0)
MONO ABS: 0.3 10*3/uL (ref 0.1–1.0)
MONOS PCT: 6.7 % (ref 3.0–12.0)
Neutro Abs: 3.3 10*3/uL (ref 1.4–7.7)
Neutrophils Relative %: 74.6 % (ref 43.0–77.0)
Platelets: 166 10*3/uL (ref 150.0–400.0)
RBC: 4.75 Mil/uL (ref 4.22–5.81)
RDW: 13.8 % (ref 11.5–15.5)
WBC: 4.5 10*3/uL (ref 4.0–10.5)

## 2016-09-25 MED ORDER — CYANOCOBALAMIN 500 MCG/0.1ML NA SOLN: NASAL | 11 refills | Status: DC

## 2016-09-25 MED ORDER — VARDENAFIL HCL 20 MG PO TABS: 20.0000 mg | ORAL_TABLET | Freq: Every day | ORAL | 5 refills | Status: DC | PRN

## 2016-09-25 NOTE — Progress Notes (Signed)
Pre visit review using our clinic review tool, if applicable. No additional management support is needed unless otherwise documented below in the visit note. 

## 2016-09-25 NOTE — Assessment & Plan Note (Signed)
Colon due 2021 We discussed age appropriate health related issues, including available/recomended screening tests and vaccinations. We discussed a need for adhering to healthy diet and exercise. Labs/EKG were reviewed. All questions were answered.

## 2016-09-25 NOTE — Addendum Note (Signed)
Addended by: Cresenciano Lick on: 09/25/2016 04:22 PM   Modules accepted: Orders

## 2016-09-25 NOTE — Progress Notes (Signed)
Subjective:  Patient ID: Evan Farmer, male    DOB: 1951-09-15  Age: 65 y.o. MRN: [id number]  CC: Annual Exam   HPI Evan Farmer presents for a well exam  Outpatient Medications Prior to Visit  Medication Sig Dispense Refill  . ALPRAZolam (XANAX) 0.25 MG tablet Take 1 tablet (0.25 mg total) by mouth 2 (two) times daily as needed for anxiety. 180 tablet 1  . apixaban (ELIQUIS) 5 MG TABS tablet Take 1 tablet (5 mg total) by mouth 2 (two) times daily. 180 tablet 3  . atenolol (TENORMIN) 50 MG tablet Take 1 tablet (50 mg total) by mouth daily. 90 tablet 3  . cholecalciferol (VITAMIN D) 1000 UNITS tablet Take 1 tablet (1,000 Units total) by mouth daily. 100 tablet 11  . Cyanocobalamin (NASCOBAL) 500 MCG/0.1ML SOLN USE 1 SPRAY IN 1 NOSTRIL   PER WEEK Yearly physical due in Oct must see MD for refills 3.9 mL 0  . flecainide (TAMBOCOR) 100 MG tablet Take 1 tablet (100 mg total) by mouth 2 (two) times daily. 10 tablet 0  . ibuprofen (ADVIL,MOTRIN) 200 MG tablet Take 200-400 mg by mouth 2 (two) times daily as needed (pain).    Marland Kitchen sertraline (ZOLOFT) 100 MG tablet Take 1 tablet (100 mg total) by mouth daily. Yearly physical due in Oct must see Md for refills 90 tablet 0  . tadalafil (CIALIS) 10 MG tablet Take 10 mg by mouth daily as needed for erectile dysfunction.    . vardenafil (LEVITRA) 20 MG tablet Take 1 tablet (20 mg total) by mouth daily as needed for erectile dysfunction. 10 tablet 0   No facility-administered medications prior to visit.     ROS Review of Systems  Constitutional: Negative for appetite change, fatigue and unexpected weight change.  HENT: Negative for congestion, nosebleeds, sneezing, sore throat and trouble swallowing.   Eyes: Negative for itching and visual disturbance.  Respiratory: Negative for cough.   Cardiovascular: Negative for chest pain, palpitations and leg swelling.  Gastrointestinal: Negative for abdominal distention, blood in stool, diarrhea and nausea.   Genitourinary: Negative for frequency and hematuria.  Musculoskeletal: Negative for back pain, gait problem, joint swelling and neck pain.  Skin: Negative for rash.  Neurological: Negative for dizziness, tremors, speech difficulty and weakness.  Psychiatric/Behavioral: Negative for agitation, dysphoric mood, sleep disturbance and suicidal ideas. The patient is not nervous/anxious.     Objective:  BP (!) 162/100   Pulse 64   Ht 5' 8.5" (1.74 m)   Wt 176 lb (79.8 kg)   SpO2 98%   BMI 26.37 kg/m   BP Readings from Last 3 Encounters:  09/25/16 (!) 162/100  08/06/16 126/78  07/17/16 (!) 144/90    Wt Readings from Last 3 Encounters:  09/25/16 176 lb (79.8 kg)  08/06/16 171 lb 12.8 oz (77.9 kg)  07/17/16 173 lb 9.6 oz (78.7 kg)    Physical Exam  Constitutional: He is oriented to person, place, and time. He appears well-developed and well-nourished. No distress.  HENT:  Head: Normocephalic and atraumatic.  Right Ear: External ear normal.  Left Ear: External ear normal.  Nose: Nose normal.  Mouth/Throat: Oropharynx is clear and moist. No oropharyngeal exudate.  Eyes: Conjunctivae and EOM are normal. Pupils are equal, round, and reactive to light. Right eye exhibits no discharge. Left eye exhibits no discharge. No scleral icterus.  Neck: Normal range of motion. Neck supple. No JVD present. No tracheal deviation present. No thyromegaly present.  Cardiovascular: Normal rate,  regular rhythm, normal heart sounds and intact distal pulses.  Exam reveals no gallop and no friction rub.   No murmur heard. Pulmonary/Chest: Effort normal and breath sounds normal. No stridor. No respiratory distress. He has no wheezes. He has no rales. He exhibits no tenderness.  Abdominal: Soft. Bowel sounds are normal. He exhibits no distension and no mass. There is no tenderness. There is no rebound and no guarding.  Genitourinary: Rectum normal. Rectal exam shows guaiac negative stool. No penile tenderness.   Musculoskeletal: Normal range of motion. He exhibits no edema or tenderness.  Lymphadenopathy:    He has no cervical adenopathy.  Neurological: He is alert and oriented to person, place, and time. He has normal reflexes. No cranial nerve deficit. He exhibits normal muscle tone. Coordination normal.  Skin: Skin is warm and dry. No rash noted. He is not diaphoretic. No erythema. No pallor.  Psychiatric: He has a normal mood and affect. His behavior is normal. Judgment and thought content normal.    Lab Results  Component Value Date   WBC 5.2 07/09/2016   HGB 17.2 (H) 07/09/2016   HCT 51.2 07/09/2016   PLT 178 07/09/2016   GLUCOSE 106 (H) 07/09/2016   CHOL 189 08/19/2015   TRIG 80.0 08/19/2015   HDL 48.50 08/19/2015   LDLCALC 125 (H) 08/19/2015   ALT 12 08/19/2015   AST 15 08/19/2015   NA 139 07/09/2016   K 4.7 07/09/2016   CL 105 07/09/2016   CREATININE 1.15 07/09/2016   BUN 17 07/09/2016   CO2 30 07/09/2016   TSH 0.816 07/09/2016   PSA 0.00 (L) 01/19/2014   INR 0.95 02/12/2011   HGBA1C  01/20/2011    5.2 (NOTE)                                                                       According to the ADA Clinical Practice Recommendations for 2011, when HbA1c is used as a screening test:   >=6.5%   Diagnostic of Diabetes Mellitus           (if abnormal result  is confirmed)  5.7-6.4%   Increased risk of developing Diabetes Mellitus  References:Diagnosis and Classification of Diabetes Mellitus,Diabetes [id number] 1):S62-S69 and Standards of Medical Care in         Diabetes - 2011,Diabetes Care,2011,34  (Suppl 1):S11-S61.    No results found.  Assessment & Plan:   There are no diagnoses linked to this encounter. I am having Mr. Renick maintain his ibuprofen, cholecalciferol, vardenafil, atenolol, tadalafil, sertraline, Cyanocobalamin, ALPRAZolam, apixaban, flecainide, and sildenafil.  Meds ordered this encounter  Medications  . sildenafil (REVATIO) 20 MG tablet     Sig: Take 20 mg by mouth as directed.     Follow-up: No Follow-up on file.  Walker Kehr, MD

## 2016-09-25 NOTE — Assessment & Plan Note (Addendum)
Options discussed Levitra rx

## 2016-09-25 NOTE — Assessment & Plan Note (Addendum)
On B12 nasal 

## 2016-09-25 NOTE — Assessment & Plan Note (Signed)
No relapse 

## 2016-10-02 ENCOUNTER — Other Ambulatory Visit: Payer: Self-pay | Admitting: Internal Medicine

## 2017-01-15 ENCOUNTER — Telehealth: Payer: Self-pay | Admitting: *Deleted

## 2017-01-15 NOTE — Telephone Encounter (Signed)
Rec'd fax pt requesting refills on his Xanax 0.25 mg 90 day supply sent to [facility]...[name]

## 2017-01-16 NOTE — Telephone Encounter (Signed)
OK to fill this/these prescription(s) with additional refills x2 Thank you!  

## 2017-01-17 MED ORDER — ALPRAZOLAM 0.25 MG PO TABS: 0.2500 mg | ORAL_TABLET | Freq: Two times a day (BID) | ORAL | 2 refills | Status: DC | PRN

## 2017-01-17 NOTE — Telephone Encounter (Signed)
Printed script, MD signed faxed back to [facility]...[name]

## 2017-03-26 ENCOUNTER — Encounter: Payer: Self-pay | Admitting: Internal Medicine

## 2017-03-26 ENCOUNTER — Ambulatory Visit (INDEPENDENT_AMBULATORY_CARE_PROVIDER_SITE_OTHER): Payer: 59 | Admitting: Internal Medicine

## 2017-03-26 DIAGNOSIS — I10 Essential (primary) hypertension: Secondary | ICD-10-CM | POA: Diagnosis not present

## 2017-03-26 DIAGNOSIS — E559 Vitamin D deficiency, unspecified: Secondary | ICD-10-CM | POA: Diagnosis not present

## 2017-03-26 DIAGNOSIS — I480 Paroxysmal atrial fibrillation: Secondary | ICD-10-CM

## 2017-03-26 DIAGNOSIS — E538 Deficiency of other specified B group vitamins: Secondary | ICD-10-CM | POA: Diagnosis not present

## 2017-03-26 MED ORDER — APIXABAN 5 MG PO TABS: 5.0000 mg | ORAL_TABLET | Freq: Two times a day (BID) | ORAL | 3 refills | Status: DC

## 2017-03-26 NOTE — Assessment & Plan Note (Signed)
On B12 

## 2017-03-26 NOTE — Assessment & Plan Note (Signed)
Cont Eliquis, Flecainide

## 2017-03-26 NOTE — Assessment & Plan Note (Signed)
On Vit D 

## 2017-03-26 NOTE — Assessment & Plan Note (Signed)
BP at home SBP nl Cont w/Atenolol, HCTZ

## 2017-03-26 NOTE — Progress Notes (Signed)
Subjective:  Patient ID: Evan Farmer, male    DOB: 1951-04-03  Age: 66 y.o. MRN: [id number]  CC: No chief complaint on file.   HPI Evan Farmer presents for A fib, anxiety, HTN f/u  Outpatient Medications Prior to Visit  Medication Sig Dispense Refill  . ALPRAZolam (XANAX) 0.25 MG tablet Take 1 tablet (0.25 mg total) by mouth 2 (two) times daily as needed for anxiety. 180 tablet 2  . apixaban (ELIQUIS) 5 MG TABS tablet Take 1 tablet (5 mg total) by mouth 2 (two) times daily. 180 tablet 3  . atenolol (TENORMIN) 50 MG tablet Take 1 tablet (50 mg total) by mouth daily. 90 tablet 3  . cholecalciferol (VITAMIN D) 1000 UNITS tablet Take 1 tablet (1,000 Units total) by mouth daily. 100 tablet 11  . Cyanocobalamin (NASCOBAL) 500 MCG/0.1ML SOLN USE 1 SPRAY IN 1 NOSTRIL   PER WEEK 3.9 mL 11  . flecainide (TAMBOCOR) 100 MG tablet Take 1 tablet (100 mg total) by mouth 2 (two) times daily. 10 tablet 0  . ibuprofen (ADVIL,MOTRIN) 200 MG tablet Take 200-400 mg by mouth 2 (two) times daily as needed (pain).    . sildenafil (REVATIO) 20 MG tablet Take 20 mg by mouth as directed.    . tadalafil (CIALIS) 10 MG tablet Take 10 mg by mouth daily as needed for erectile dysfunction.    . sertraline (ZOLOFT) 100 MG tablet TAKE 1 TABLET DAILY. YEARLYPHYSICAL DUE IN OCTOBER -  MUST SEE MD FORREFILLS. 90 tablet 0  . vardenafil (LEVITRA) 20 MG tablet Take 1 tablet (20 mg total) by mouth daily as needed for erectile dysfunction. 30 tablet 5   No facility-administered medications prior to visit.     ROS Review of Systems  Constitutional: Negative for appetite change, fatigue and unexpected weight change.  HENT: Negative for congestion, nosebleeds, sneezing, sore throat and trouble swallowing.   Eyes: Negative for itching and visual disturbance.  Respiratory: Negative for cough.   Cardiovascular: Negative for chest pain, palpitations and leg swelling.  Gastrointestinal: Negative for abdominal distention,  blood in stool, diarrhea and nausea.  Genitourinary: Negative for frequency and hematuria.  Musculoskeletal: Negative for back pain, gait problem, joint swelling and neck pain.  Skin: Negative for rash.  Neurological: Negative for dizziness, tremors, speech difficulty and weakness.  Psychiatric/Behavioral: Negative for agitation, dysphoric mood and sleep disturbance. The patient is not nervous/anxious.     Objective:  BP 138/80 (BP Location: Left Arm, Patient Position: Sitting, Cuff Size: Normal)   Pulse (!) 57   Temp 97.7 F (36.5 C) (Oral)   Ht 5' 8.5" (1.74 m)   Wt 170 lb (77.1 kg)   SpO2 99%   BMI 25.47 kg/m   BP Readings from Last 3 Encounters:  03/26/17 138/80  09/25/16 139/89  08/06/16 126/78    Wt Readings from Last 3 Encounters:  03/26/17 170 lb (77.1 kg)  09/25/16 176 lb (79.8 kg)  08/06/16 171 lb 12.8 oz (77.9 kg)    Physical Exam  Constitutional: He is oriented to person, place, and time. He appears well-developed. No distress.  NAD  HENT:  Mouth/Throat: Oropharynx is clear and moist.  Eyes: Conjunctivae are normal. Pupils are equal, round, and reactive to light.  Neck: Normal range of motion. No JVD present. No thyromegaly present.  Cardiovascular: Normal rate, regular rhythm, normal heart sounds and intact distal pulses.  Exam reveals no gallop and no friction rub.   No murmur heard. Pulmonary/Chest: Effort normal and  breath sounds normal. No respiratory distress. He has no wheezes. He has no rales. He exhibits no tenderness.  Abdominal: Soft. Bowel sounds are normal. He exhibits no distension and no mass. There is no tenderness. There is no rebound and no guarding.  Musculoskeletal: Normal range of motion. He exhibits no edema or tenderness.  Lymphadenopathy:    He has no cervical adenopathy.  Neurological: He is alert and oriented to person, place, and time. He has normal reflexes. No cranial nerve deficit. He exhibits normal muscle tone. He displays a  negative Romberg sign. Coordination and gait normal.  Skin: Skin is warm and dry. No rash noted.  Psychiatric: He has a normal mood and affect. His behavior is normal. Judgment and thought content normal.    Lab Results  Component Value Date   WBC 4.5 09/25/2016   HGB 15.4 09/25/2016   HCT 45.5 09/25/2016   PLT 166.0 09/25/2016   GLUCOSE 96 09/25/2016   CHOL 189 08/19/2015   TRIG 80.0 08/19/2015   HDL 48.50 08/19/2015   LDLCALC 125 (H) 08/19/2015   ALT 12 08/19/2015   AST 15 08/19/2015   NA 140 09/25/2016   K 4.5 09/25/2016   CL 104 09/25/2016   CREATININE 1.18 09/25/2016   BUN 17 09/25/2016   CO2 32 09/25/2016   TSH 0.816 07/09/2016   PSA 0.00 (L) 01/19/2014   INR 0.95 02/12/2011   HGBA1C  01/20/2011    5.2 (NOTE)                                                                       According to the ADA Clinical Practice Recommendations for 2011, when HbA1c is used as a screening test:   >=6.5%   Diagnostic of Diabetes Mellitus           (if abnormal result  is confirmed)  5.7-6.4%   Increased risk of developing Diabetes Mellitus  References:Diagnosis and Classification of Diabetes Mellitus,Diabetes PNTI,1443,15(QMGQQ 1):S62-S69 and Standards of Medical Care in         Diabetes - 2011,Diabetes Care,2011,34  (Suppl 1):S11-S61.    No results found.  Assessment & Plan:   There are no diagnoses linked to this encounter. I have discontinued Mr. Caravello vardenafil and sertraline. I am also having him maintain his ibuprofen, cholecalciferol, atenolol, tadalafil, apixaban, flecainide, sildenafil, Cyanocobalamin, and ALPRAZolam.  No orders of the defined types were placed in this encounter.    Follow-up: No Follow-up on file.  Walker Kehr, MD

## 2017-03-26 NOTE — Assessment & Plan Note (Signed)
-   doing well

## 2017-03-28 ENCOUNTER — Other Ambulatory Visit (INDEPENDENT_AMBULATORY_CARE_PROVIDER_SITE_OTHER): Payer: 59

## 2017-03-28 DIAGNOSIS — I480 Paroxysmal atrial fibrillation: Secondary | ICD-10-CM

## 2017-03-28 LAB — BASIC METABOLIC PANEL
BUN: 21 mg/dL (ref 6–23)
CALCIUM: 8.4 mg/dL (ref 8.4–10.5)
CO2: 30 mEq/L (ref 19–32)
Chloride: 104 mEq/L (ref 96–112)
Creatinine, Ser: 1.09 mg/dL (ref 0.40–1.50)
GFR: 72.32 mL/min (ref >60.00)
GLUCOSE: 101 mg/dL — AB (ref 70–99)
POTASSIUM: 4.5 meq/L (ref 3.5–5.1)
SODIUM: 137 meq/L (ref 135–145)

## 2017-07-19 ENCOUNTER — Other Ambulatory Visit: Payer: Self-pay | Admitting: Internal Medicine

## 2017-08-23 ENCOUNTER — Telehealth: Payer: Self-pay | Admitting: Internal Medicine

## 2017-08-23 ENCOUNTER — Other Ambulatory Visit: Payer: Self-pay | Admitting: *Deleted

## 2017-08-23 MED ORDER — ATENOLOL 50 MG PO TABS: 50.0000 mg | ORAL_TABLET | Freq: Every day | ORAL | 1 refills | Status: DC

## 2017-08-23 NOTE — Telephone Encounter (Signed)
New message    Pt is calling.    *STAT* If patient is at the pharmacy, call can be transferred to refill team.   1. Which medications need to be refilled? (please list name of each medication and dose if known) atenolol 50 mg  2. Which pharmacy/location (including street and city if local pharmacy) is medication to be sent to? [facility] on [address]   3. Do they need a 30 day or 90 day supply? 30 day

## 2017-09-16 ENCOUNTER — Other Ambulatory Visit: Payer: Self-pay | Admitting: Internal Medicine

## 2017-09-24 ENCOUNTER — Encounter: Payer: Self-pay | Admitting: Internal Medicine

## 2017-09-24 ENCOUNTER — Ambulatory Visit (INDEPENDENT_AMBULATORY_CARE_PROVIDER_SITE_OTHER): Payer: 59 | Admitting: Internal Medicine

## 2017-09-24 VITALS — BP 144/88 | HR 65 | Temp 98.0°F | Resp 16 | Ht 68.5 in | Wt 172.1 lb

## 2017-09-24 DIAGNOSIS — E559 Vitamin D deficiency, unspecified: Secondary | ICD-10-CM

## 2017-09-24 DIAGNOSIS — Z23 Encounter for immunization: Secondary | ICD-10-CM

## 2017-09-24 DIAGNOSIS — N529 Male erectile dysfunction, unspecified: Secondary | ICD-10-CM | POA: Diagnosis not present

## 2017-09-24 DIAGNOSIS — E538 Deficiency of other specified B group vitamins: Secondary | ICD-10-CM

## 2017-09-24 NOTE — Assessment & Plan Note (Signed)
[place]

## 2017-09-24 NOTE — Progress Notes (Signed)
Subjective:  Patient ID: Evan Farmer, male    DOB: [birth year]  Age: 66 y.o. MRN: [id number]  CC: No chief complaint on file.   HPI CRAYTON SAVARESE presents for HTN, A fib, B12 def f/u He got engaged. Grieving his dog's death  Outpatient Medications Prior to Visit  Medication Sig Dispense Refill  . ALPRAZolam (XANAX) 0.25 MG tablet Take 1 tablet (0.25 mg total) by mouth 2 (two) times daily as needed for anxiety. 180 tablet 2  . apixaban (ELIQUIS) 5 MG TABS tablet Take 1 tablet (5 mg total) by mouth 2 (two) times daily. 180 tablet 3  . atenolol (TENORMIN) 50 MG tablet Take 1 tablet (50 mg total) by mouth daily. 30 tablet 1  . cholecalciferol (VITAMIN D) 1000 UNITS tablet Take 1 tablet (1,000 Units total) by mouth daily. 100 tablet 11  . Cyanocobalamin (NASCOBAL) 500 MCG/0.1ML SOLN USE 1 SPRAY IN 1 NOSTRIL   PER WEEK 3.9 mL 11  . flecainide (TAMBOCOR) 100 MG tablet Take 1 tablet (100 mg total) 2 (two) times daily by mouth. Please keep upcoming appt before anymore refills. Thank you 180 tablet 0  . ibuprofen (ADVIL,MOTRIN) 200 MG tablet Take 200-400 mg by mouth 2 (two) times daily as needed (pain).    . sildenafil (REVATIO) 20 MG tablet Take 20 mg by mouth as directed.    . tadalafil (CIALIS) 10 MG tablet Take 10 mg by mouth daily as needed for erectile dysfunction.     No facility-administered medications prior to visit.     ROS Review of Systems  Constitutional: Negative for appetite change, fatigue and unexpected weight change.  HENT: Negative for congestion, nosebleeds, sneezing, sore throat and trouble swallowing.   Eyes: Negative for itching and visual disturbance.  Respiratory: Negative for cough.   Cardiovascular: Negative for chest pain, palpitations and leg swelling.  Gastrointestinal: Negative for abdominal distention, blood in stool, diarrhea and nausea.  Genitourinary: Negative for frequency and hematuria.  Musculoskeletal: Negative for gait problem, joint swelling and  neck pain.  Skin: Negative for rash.  Neurological: Negative for dizziness, tremors, speech difficulty and weakness.  Psychiatric/Behavioral: Negative for agitation, dysphoric mood and sleep disturbance. The patient is nervous/anxious.     Objective:  BP (!) 144/88 (BP Location: Left Arm, Patient Position: Sitting, Cuff Size: Large)   Pulse 65   Temp 98 F (36.7 C) (Oral)   Resp 16   Ht 5' 8.5" (1.74 m)   Wt 172 lb 1.9 oz (78.1 kg)   SpO2 97%   BMI 25.79 kg/m   BP Readings from Last 3 Encounters:  09/24/17 (!) 144/88  03/26/17 138/80  09/25/16 139/89    Wt Readings from Last 3 Encounters:  09/24/17 172 lb 1.9 oz (78.1 kg)  03/26/17 170 lb (77.1 kg)  09/25/16 176 lb (79.8 kg)    Physical Exam  Constitutional: He is oriented to person, place, and time. He appears well-developed. No distress.  NAD  HENT:  Mouth/Throat: Oropharynx is clear and moist.  Eyes: Conjunctivae are normal. Pupils are equal, round, and reactive to light.  Neck: Normal range of motion. No JVD present. No thyromegaly present.  Cardiovascular: Normal rate, regular rhythm, normal heart sounds and intact distal pulses. Exam reveals no gallop and no friction rub.  No murmur heard. Pulmonary/Chest: Effort normal and breath sounds normal. No respiratory distress. He has no wheezes. He has no rales. He exhibits no tenderness.  Abdominal: Soft. Bowel sounds are normal. He exhibits no distension  and no mass. There is no tenderness. There is no rebound and no guarding.  Musculoskeletal: Normal range of motion. He exhibits no edema or tenderness.  Lymphadenopathy:    He has no cervical adenopathy.  Neurological: He is alert and oriented to person, place, and time. He has normal reflexes. No cranial nerve deficit. He exhibits normal muscle tone. He displays a negative Romberg sign. Coordination and gait normal.  Skin: Skin is warm and dry. No rash noted.  Psychiatric: He has a normal mood and affect. His behavior  is normal. Judgment and thought content normal.    Lab Results  Component Value Date   WBC 4.5 09/25/2016   HGB 15.4 09/25/2016   HCT 45.5 09/25/2016   PLT 166.0 09/25/2016   GLUCOSE 101 (H) 03/28/2017   CHOL 189 08/19/2015   TRIG 80.0 08/19/2015   HDL 48.50 08/19/2015   LDLCALC 125 (H) 08/19/2015   ALT 12 08/19/2015   AST 15 08/19/2015   NA 137 03/28/2017   K 4.5 03/28/2017   CL 104 03/28/2017   CREATININE 1.09 03/28/2017   BUN 21 03/28/2017   CO2 30 03/28/2017   TSH 0.816 07/09/2016   PSA 0.00 (L) 01/19/2014   INR 0.95 02/12/2011   HGBA1C  01/20/2011    5.2 (NOTE)                                                                       According to the ADA Clinical Practice Recommendations for 2011, when HbA1c is used as a screening test:   >=6.5%   Diagnostic of Diabetes Mellitus           (if abnormal result  is confirmed)  5.7-6.4%   Increased risk of developing Diabetes Mellitus  References:Diagnosis and Classification of Diabetes Mellitus,Diabetes QMVH,8469,62(XBMWU 1):S62-S69 and Standards of Medical Care in         Diabetes - 2011,Diabetes Care,2011,34  (Suppl 1):S11-S61.    No results found.  Assessment & Plan:   There are no diagnoses linked to this encounter. I am having [name] maintain his ibuprofen, cholecalciferol, tadalafil, sildenafil, Cyanocobalamin, ALPRAZolam, apixaban, atenolol, and flecainide.  No orders of the defined types were placed in this encounter.    Follow-up: No Follow-up on file.  Walker Kehr, MD

## 2017-09-24 NOTE — Addendum Note (Signed)
Addended by: Delice Bison E on: 09/24/2017 02:09 PM   Modules accepted: Orders

## 2017-09-24 NOTE — Assessment & Plan Note (Signed)
On B12 SL 

## 2017-09-24 NOTE — Assessment & Plan Note (Signed)
Vit D 

## 2017-09-30 ENCOUNTER — Other Ambulatory Visit (INDEPENDENT_AMBULATORY_CARE_PROVIDER_SITE_OTHER): Payer: 59

## 2017-09-30 DIAGNOSIS — E538 Deficiency of other specified B group vitamins: Secondary | ICD-10-CM | POA: Diagnosis not present

## 2017-09-30 DIAGNOSIS — E559 Vitamin D deficiency, unspecified: Secondary | ICD-10-CM | POA: Diagnosis not present

## 2017-09-30 LAB — CBC WITH DIFFERENTIAL/PLATELET
BASOS PCT: 0.5 % (ref 0.0–3.0)
Basophils Absolute: 0 10*3/uL (ref 0.0–0.1)
EOS ABS: 0 10*3/uL (ref 0.0–0.7)
EOS PCT: 0.4 % (ref 0.0–5.0)
HCT: 51.7 % (ref 39.0–52.0)
HEMOGLOBIN: 17.7 g/dL — AB (ref 13.0–17.0)
LYMPHS ABS: 1 10*3/uL (ref 0.7–4.0)
Lymphocytes Relative: 19.4 % (ref 12.0–46.0)
MCHC: 34.3 g/dL (ref 30.0–36.0)
MCV: 100.2 fl — AB (ref 78.0–100.0)
MONO ABS: 0.4 10*3/uL (ref 0.1–1.0)
Monocytes Relative: 7.5 % (ref 3.0–12.0)
NEUTROS ABS: 3.7 10*3/uL (ref 1.4–7.7)
Neutrophils Relative %: 72.2 % (ref 43.0–77.0)
Platelets: 171 10*3/uL (ref 150.0–400.0)
RBC: 5.15 Mil/uL (ref 4.22–5.81)
RDW: 13.2 % (ref 11.5–15.5)
WBC: 5.1 10*3/uL (ref 4.0–10.5)

## 2017-09-30 LAB — BASIC METABOLIC PANEL
BUN: 17 mg/dL (ref 6–23)
CHLORIDE: 101 meq/L (ref 96–112)
CO2: 30 meq/L (ref 19–32)
CREATININE: 1.09 mg/dL (ref 0.40–1.50)
Calcium: 9 mg/dL (ref 8.4–10.5)
GFR: 72.21 mL/min (ref >60.00)
GLUCOSE: 113 mg/dL — AB (ref 70–99)
POTASSIUM: 4.2 meq/L (ref 3.5–5.1)
Sodium: 137 mEq/L (ref 135–145)

## 2017-09-30 LAB — HEPATIC FUNCTION PANEL
ALK PHOS: 70 U/L (ref 39–117)
ALT: 13 U/L (ref 0–53)
AST: 18 U/L (ref 0–37)
Albumin: 4.1 g/dL (ref 3.5–5.2)
BILIRUBIN DIRECT: 0.1 mg/dL (ref 0.0–0.3)
BILIRUBIN TOTAL: 0.9 mg/dL (ref 0.2–1.2)
TOTAL PROTEIN: 7 g/dL (ref 6.0–8.3)

## 2017-09-30 LAB — TSH: TSH: 1.15 u[IU]/mL (ref 0.35–4.50)

## 2017-09-30 LAB — VITAMIN B12: VITAMIN B 12: 1307 pg/mL — AB (ref 211–911)

## 2017-09-30 LAB — VITAMIN D 25 HYDROXY (VIT D DEFICIENCY, FRACTURES): VITD: 37.12 ng/mL (ref 30.00–100.00)

## 2017-10-09 ENCOUNTER — Encounter: Payer: Self-pay | Admitting: Internal Medicine

## 2017-10-16 ENCOUNTER — Ambulatory Visit: Payer: 59 | Admitting: Internal Medicine

## 2017-10-21 ENCOUNTER — Other Ambulatory Visit: Payer: Self-pay | Admitting: Internal Medicine

## 2017-11-22 ENCOUNTER — Telehealth: Payer: Self-pay | Admitting: Internal Medicine

## 2017-11-22 MED ORDER — ATENOLOL 50 MG PO TABS: 50.0000 mg | ORAL_TABLET | Freq: Every day | ORAL | 0 refills | Status: DC

## 2017-11-22 NOTE — Telephone Encounter (Signed)
New message      *STAT* If patient is at the pharmacy, call can be transferred to refill team.   1. Which medications need to be refilled? (please list name of each medication and dose if known) atenolol (TENORMIN) 50 MG tablet  2. Which pharmacy/location (including street and city if local pharmacy) is medication to be sent to? [facility] [place], [place] - [place]  3. Do they need a 30 day or 90 day supply? [place]

## 2017-11-22 NOTE — Telephone Encounter (Signed)
Pt was prescribe 15 day supply because pt has an appt on 12/02/17, pt has cancelled before, pt has not been seen since 07/2016. This is the reason why a 15 day supply was sent in. Pt must keep this appt before anymore refills. I sent this message to the pt's pharmacy. Confirmation received.

## 2017-12-02 ENCOUNTER — Ambulatory Visit (INDEPENDENT_AMBULATORY_CARE_PROVIDER_SITE_OTHER): Payer: 59 | Admitting: Internal Medicine

## 2017-12-02 ENCOUNTER — Encounter: Payer: Self-pay | Admitting: Internal Medicine

## 2017-12-02 VITALS — BP 146/92 | HR 70 | Ht 68.5 in | Wt 183.0 lb

## 2017-12-02 DIAGNOSIS — I10 Essential (primary) hypertension: Secondary | ICD-10-CM

## 2017-12-02 DIAGNOSIS — I480 Paroxysmal atrial fibrillation: Secondary | ICD-10-CM

## 2017-12-02 LAB — CBC WITH DIFFERENTIAL/PLATELET
BASOS ABS: 0 10*3/uL (ref 0.0–0.2)
Basos: 0 %
EOS (ABSOLUTE): 0 10*3/uL (ref 0.0–0.4)
Eos: 0 %
Hematocrit: 46.5 % (ref 37.5–51.0)
Hemoglobin: 16.4 g/dL (ref 13.0–17.7)
IMMATURE GRANS (ABS): 0 10*3/uL (ref 0.0–0.1)
Immature Granulocytes: 0 %
LYMPHS: 17 %
Lymphocytes Absolute: 0.8 10*3/uL (ref 0.7–3.1)
MCH: 34.7 pg — ABNORMAL HIGH (ref 26.6–33.0)
MCHC: 35.3 g/dL (ref 31.5–35.7)
MCV: 99 fL — ABNORMAL HIGH (ref 79–97)
Monocytes Absolute: 0.4 10*3/uL (ref 0.1–0.9)
Monocytes: 7 %
Neutrophils Absolute: 3.8 10*3/uL (ref 1.4–7.0)
Neutrophils: 76 %
Platelets: 164 10*3/uL (ref 150–379)
RBC: 4.72 x10E6/uL (ref 4.14–5.80)
RDW: 13.5 % (ref 12.3–15.4)
WBC: 5 10*3/uL (ref 3.4–10.8)

## 2017-12-02 LAB — BASIC METABOLIC PANEL
BUN/Creatinine Ratio: 14 (ref 10–24)
BUN: 15 mg/dL (ref 8–27)
CHLORIDE: 101 mmol/L (ref 96–106)
CO2: 27 mmol/L (ref 20–29)
Calcium: 8.6 mg/dL (ref 8.6–10.2)
Creatinine, Ser: 1.07 mg/dL (ref 0.76–1.27)
GFR calc Af Amer: 84 mL/min/{1.73_m2} (ref >59)
GFR calc non Af Amer: 73 mL/min/{1.73_m2} (ref >59)
Glucose: 110 mg/dL — ABNORMAL HIGH (ref 65–99)
Potassium: 4.3 mmol/L (ref 3.5–5.2)
Sodium: 141 mmol/L (ref 134–144)

## 2017-12-02 MED ORDER — LOSARTAN POTASSIUM 25 MG PO TABS: 25.0000 mg | ORAL_TABLET | Freq: Every day | ORAL | 3 refills | Status: DC

## 2017-12-02 NOTE — Patient Instructions (Signed)
Medication Instructions:  Your physician has recommended you make the following change in your medication: 1) Start Losartan 25 mg daily   Labwork: Your physician recommends that you return for lab work today: BMP/CBC   Testing/Procedures: None ordered   Follow-Up: Your physician wants you to follow-up in: 12 months with Tommye Standard, PA You will receive a reminder letter in the mail two months in advance. If you don't receive a letter, please call our office to schedule the follow-up appointment.   Any Other Special Instructions Will Be Listed Below (If Applicable).     If you need a refill on your cardiac medications before your next appointment, please call your pharmacy.

## 2017-12-02 NOTE — Progress Notes (Signed)
PCP: Plotnikov, Evie Lacks, MD   Primary EP: Dr Chipper Herb is a 67 y.o. male who presents today for routine electrophysiology followup.  Since last being seen in our clinic, the patient reports doing very well.  Today, he denies symptoms of palpitations, chest pain, shortness of breath,  lower extremity edema, dizziness, presyncope, or syncope.  The patient is otherwise without complaint today.   Past Medical History:  Diagnosis Date  . Depression   . HTN (hypertension)   . Hyperlipidemia   . Paroxysmal A-fib ([place])   . Prostate cancer [facility])    2010 Dr Terance Hart   . [place] spotted fever    age 25  . S/P cardiac cath April 2012   No obstructive disease. Normal LV function  . Scoliosis   . TIA (transient ischemic attack)    2012  . Vitamin B 12 deficiency   . Weight gain    Past Surgical History:  Procedure Laterality Date  . CARDIOVERSION N/A 07/10/2016   Procedure: CARDIOVERSION;  Surgeon: Lelon Perla, MD;  Location: [facility] ENDOSCOPY;  Service: Cardiovascular;  Laterality: N/A;  . CATARACT EXTRACTION W/ INTRAOCULAR LENS  IMPLANT, BILATERAL    . COLONOSCOPY  01-24-2005   TICS ONLY   . HERNIA REPAIR    . [place] and 2003   twice  . NASAL SEPTUM SURGERY    . PROSTATECTOMY  11/2008   Robotic   . TEE WITHOUT CARDIOVERSION N/A 07/10/2016   Procedure: TRANSESOPHAGEAL ECHOCARDIOGRAM (TEE);  Surgeon: Lelon Perla, MD;  Location: [facility] ENDOSCOPY;  Service: Cardiovascular;  Laterality: N/A;    ROS- all systems are reviewed and negatives except as per HPI above  Current Outpatient Medications  Medication Sig Dispense Refill  . ALPRAZolam (XANAX) 0.25 MG tablet TAKE 1 TABLET TWICE A DAY  AS NEEDED FOR ANXIETY 180 tablet 1  . apixaban (ELIQUIS) 5 MG TABS tablet Take 1 tablet (5 mg total) by mouth 2 (two) times daily. 180 tablet 3  . atenolol (TENORMIN) 50 MG tablet Take 1 tablet (50 mg total) by mouth daily. Please keep sch appt for further refills 15  tablet 0  . cholecalciferol (VITAMIN D) 1000 UNITS tablet Take 1 tablet (1,000 Units total) by mouth daily. 100 tablet 11  . Cyanocobalamin (NASCOBAL) 500 MCG/0.1ML SOLN USE 1 SPRAY IN 1 NOSTRIL   PER WEEK 3.9 mL 11  . flecainide (TAMBOCOR) 100 MG tablet Take 1 tablet (100 mg total) 2 (two) times daily by mouth. Please keep upcoming appt before anymore refills. Thank you 180 tablet 0  . ibuprofen (ADVIL,MOTRIN) 200 MG tablet Take 200-400 mg by mouth 2 (two) times daily as needed (pain).    . sildenafil (REVATIO) 20 MG tablet Take 20 mg by mouth as directed.     No current facility-administered medications for this visit.     Physical Exam: Vitals:   12/02/17 0943  BP: (!) 146/92  Pulse: 70  SpO2: 98%  Weight: 183 lb (83 kg)  Height: 5' 8.5" (1.74 m)    GEN- The patient is well appearing, alert and oriented x 3 today.   Head- normocephalic, atraumatic Eyes-  Sclera clear, conjunctiva pink Ears- hearing intact Oropharynx- clear Lungs- Clear to ausculation bilaterally, normal work of breathing Heart- Regular rate and rhythm, no murmurs, rubs or gallops, PMI not laterally displaced GI- soft, NT, ND, + BS Extremities- no clubbing, cyanosis, or edema  EKG tracing ordered today is personally reviewed and shows  sinus rhythm 59 bpm  Assessment and Plan:  1. Paroxysmal atrial fibrillation Well controlled On eliquis His chads2vasc score is at least 3.  Cbc today  2. Hypertensive cardiovascular disease Elevated BP  He reports stress at home Start losartan 25mg  daily bmet today Follow-up with PCP for further management  Return to see EP PA in a year  Thompson Grayer MD, [facility] 12/02/2017 10:07 AM

## 2017-12-04 ENCOUNTER — Other Ambulatory Visit: Payer: Self-pay | Admitting: *Deleted

## 2017-12-04 MED ORDER — ATENOLOL 50 MG PO TABS: 50.0000 mg | ORAL_TABLET | Freq: Every day | ORAL | 3 refills | Status: DC

## 2017-12-04 MED ORDER — ATENOLOL 50 MG PO TABS: 50.0000 mg | ORAL_TABLET | Freq: Every day | ORAL | 0 refills | Status: DC

## 2017-12-13 ENCOUNTER — Encounter: Payer: Self-pay | Admitting: Internal Medicine

## 2018-01-27 ENCOUNTER — Other Ambulatory Visit: Payer: Self-pay | Admitting: Internal Medicine

## 2018-03-25 ENCOUNTER — Ambulatory Visit (INDEPENDENT_AMBULATORY_CARE_PROVIDER_SITE_OTHER): Payer: 59 | Admitting: Internal Medicine

## 2018-03-25 ENCOUNTER — Encounter: Payer: Self-pay | Admitting: Internal Medicine

## 2018-03-25 ENCOUNTER — Other Ambulatory Visit (INDEPENDENT_AMBULATORY_CARE_PROVIDER_SITE_OTHER): Payer: 59

## 2018-03-25 VITALS — BP 142/86 | HR 62 | Temp 97.8°F | Ht 68.5 in | Wt 184.0 lb

## 2018-03-25 DIAGNOSIS — F411 Generalized anxiety disorder: Secondary | ICD-10-CM

## 2018-03-25 DIAGNOSIS — I10 Essential (primary) hypertension: Secondary | ICD-10-CM

## 2018-03-25 DIAGNOSIS — Z23 Encounter for immunization: Secondary | ICD-10-CM | POA: Diagnosis not present

## 2018-03-25 DIAGNOSIS — Z8546 Personal history of malignant neoplasm of prostate: Secondary | ICD-10-CM | POA: Diagnosis not present

## 2018-03-25 DIAGNOSIS — I480 Paroxysmal atrial fibrillation: Secondary | ICD-10-CM | POA: Diagnosis not present

## 2018-03-25 DIAGNOSIS — Z0000 Encounter for general adult medical examination without abnormal findings: Secondary | ICD-10-CM

## 2018-03-25 DIAGNOSIS — E538 Deficiency of other specified B group vitamins: Secondary | ICD-10-CM | POA: Diagnosis not present

## 2018-03-25 DIAGNOSIS — E559 Vitamin D deficiency, unspecified: Secondary | ICD-10-CM | POA: Diagnosis not present

## 2018-03-25 LAB — URINALYSIS
Bilirubin Urine: NEGATIVE
Ketones, ur: NEGATIVE
Leukocytes, UA: NEGATIVE
NITRITE: NEGATIVE
PH: 6 (ref 5.0–8.0)
Specific Gravity, Urine: 1.01 (ref 1.000–1.030)
TOTAL PROTEIN, URINE-UPE24: NEGATIVE
Urine Glucose: NEGATIVE
Urobilinogen, UA: 0.2 (ref 0.0–1.0)

## 2018-03-25 LAB — CBC WITH DIFFERENTIAL/PLATELET
Basophils Absolute: 0 10*3/uL (ref 0.0–0.1)
Basophils Relative: 0.6 % (ref 0.0–3.0)
EOS PCT: 1.3 % (ref 0.0–5.0)
Eosinophils Absolute: 0 10*3/uL (ref 0.0–0.7)
HEMATOCRIT: 46.9 % (ref 39.0–52.0)
HEMOGLOBIN: 16.3 g/dL (ref 13.0–17.0)
LYMPHS PCT: 20.2 % (ref 12.0–46.0)
Lymphs Abs: 0.8 10*3/uL (ref 0.7–4.0)
MCHC: 34.8 g/dL (ref 30.0–36.0)
MCV: 99.6 fl (ref 78.0–100.0)
MONOS PCT: 7.4 % (ref 3.0–12.0)
Monocytes Absolute: 0.3 10*3/uL (ref 0.1–1.0)
Neutro Abs: 2.7 10*3/uL (ref 1.4–7.7)
Neutrophils Relative %: 70.5 % (ref 43.0–77.0)
Platelets: 152 10*3/uL (ref 150.0–400.0)
RBC: 4.71 Mil/uL (ref 4.22–5.81)
RDW: 13.3 % (ref 11.5–15.5)
WBC: 3.8 10*3/uL — AB (ref 4.0–10.5)

## 2018-03-25 LAB — BASIC METABOLIC PANEL
BUN: 11 mg/dL (ref 6–23)
CALCIUM: 8.4 mg/dL (ref 8.4–10.5)
CO2: 29 meq/L (ref 19–32)
CREATININE: 1.01 mg/dL (ref 0.40–1.50)
Chloride: 106 mEq/L (ref 96–112)
GFR: 78.73 mL/min (ref >60.00)
Glucose, Bld: 91 mg/dL (ref 70–99)
Potassium: 4.1 mEq/L (ref 3.5–5.1)
Sodium: 140 mEq/L (ref 135–145)

## 2018-03-25 LAB — HEPATIC FUNCTION PANEL
ALT: 11 U/L (ref 0–53)
AST: 15 U/L (ref 0–37)
Albumin: 3.6 g/dL (ref 3.5–5.2)
Alkaline Phosphatase: 56 U/L (ref 39–117)
Bilirubin, Direct: 0.2 mg/dL (ref 0.0–0.3)
Total Bilirubin: 0.8 mg/dL (ref 0.2–1.2)
Total Protein: 6.3 g/dL (ref 6.0–8.3)

## 2018-03-25 LAB — LIPID PANEL
Cholesterol: 190 mg/dL (ref 0–200)
HDL: 55.4 mg/dL (ref >39.00)
LDL Cholesterol: 114 mg/dL — ABNORMAL HIGH (ref 0–99)
NonHDL: 134.44
Total CHOL/HDL Ratio: 3
Triglycerides: 101 mg/dL (ref 0.0–149.0)
VLDL: 20.2 mg/dL (ref 0.0–40.0)

## 2018-03-25 LAB — TSH: TSH: 0.98 u[IU]/mL (ref 0.35–4.50)

## 2018-03-25 MED ORDER — CYANOCOBALAMIN 500 MCG/0.1ML NA SOLN: NASAL | 3 refills | Status: DC

## 2018-03-25 MED ORDER — VARDENAFIL HCL 20 MG PO TABS: 20.0000 mg | ORAL_TABLET | Freq: Every day | ORAL | 6 refills | Status: DC | PRN

## 2018-03-25 NOTE — Assessment & Plan Note (Signed)
Doing well 

## 2018-03-25 NOTE — Assessment & Plan Note (Signed)
Eliquis, Flecainide  

## 2018-03-25 NOTE — Assessment & Plan Note (Signed)
Zoloft, prn Xanax  Potential benefits of a long term benzodiazepines  use as well as potential risks  and complications were explained to the patient and were aknowledged. 

## 2018-03-25 NOTE — Assessment & Plan Note (Signed)
PSA

## 2018-03-25 NOTE — Assessment & Plan Note (Signed)
On Vit D 

## 2018-03-25 NOTE — Addendum Note (Signed)
Addended by: Karren Cobble on: 03/25/2018 03:04 PM   Modules accepted: Orders

## 2018-03-25 NOTE — Assessment & Plan Note (Signed)
On B12 nasal or SL

## 2018-03-25 NOTE — Assessment & Plan Note (Signed)
BP at home SBP 120-130 Atenolol, HCTZ

## 2018-03-25 NOTE — Assessment & Plan Note (Signed)
Atenolol, Flecanide 

## 2018-03-25 NOTE — Progress Notes (Signed)
Subjective:  Patient ID: Evan Farmer, male    DOB: [birth year]  Age: 67 y.o. MRN: [id number]  CC: No chief complaint on file.   HPI Evan Farmer presents for a well exam F/u HTN, prostate ca f/u   Outpatient Medications Prior to Visit  Medication Sig Dispense Refill  . ALPRAZolam (XANAX) 0.25 MG tablet TAKE 1 TABLET TWICE A DAY  AS NEEDED FOR ANXIETY 180 tablet 1  . apixaban (ELIQUIS) 5 MG TABS tablet Take 1 tablet (5 mg total) by mouth 2 (two) times daily. 180 tablet 3  . atenolol (TENORMIN) 50 MG tablet Take 1 tablet (50 mg total) by mouth daily. 90 tablet 3  . cholecalciferol (VITAMIN D) 1000 UNITS tablet Take 1 tablet (1,000 Units total) by mouth daily. 100 tablet 11  . Cyanocobalamin (NASCOBAL) 500 MCG/0.1ML SOLN USE 1 SPRAY IN 1 NOSTRIL   PER WEEK 3.9 mL 11  . flecainide (TAMBOCOR) 100 MG tablet Take 1 tablet (100 mg total) by mouth 2 (two) times daily. 180 tablet 3  . ibuprofen (ADVIL,MOTRIN) 200 MG tablet Take 200-400 mg by mouth 2 (two) times daily as needed (pain).    . sildenafil (REVATIO) 20 MG tablet Take 20 mg by mouth as directed.    Marland Kitchen losartan (COZAAR) 25 MG tablet Take 1 tablet (25 mg total) by mouth daily. 90 tablet 3   No facility-administered medications prior to visit.     ROS Review of Systems  Constitutional: Positive for unexpected weight change. Negative for appetite change and fatigue.  HENT: Negative for congestion, nosebleeds, sneezing, sore throat and trouble swallowing.   Eyes: Negative for itching and visual disturbance.  Respiratory: Negative for cough.   Cardiovascular: Negative for chest pain, palpitations and leg swelling.  Gastrointestinal: Negative for abdominal distention, blood in stool, diarrhea and nausea.  Genitourinary: Negative for frequency and hematuria.  Musculoskeletal: Negative for back pain, gait problem, joint swelling and neck pain.  Skin: Negative for rash.  Neurological: Negative for dizziness, tremors, speech difficulty  and weakness.  Psychiatric/Behavioral: Negative for agitation, dysphoric mood, sleep disturbance and suicidal ideas. The patient is nervous/anxious.     Objective:  BP (!) 142/86 (BP Location: Left Arm, Patient Position: Sitting, Cuff Size: Large)   Pulse 62   Temp 97.8 F (36.6 C) (Oral)   Ht 5' 8.5" (1.74 m)   Wt 184 lb (83.5 kg)   SpO2 98%   BMI 27.57 kg/m   BP Readings from Last 3 Encounters:  03/25/18 (!) 142/86  12/02/17 (!) 146/92  09/24/17 (!) 144/88    Wt Readings from Last 3 Encounters:  03/25/18 184 lb (83.5 kg)  12/02/17 183 lb (83 kg)  09/24/17 172 lb 1.9 oz (78.1 kg)    Physical Exam  Constitutional: He is oriented to person, place, and time. He appears well-developed. No distress.  NAD  HENT:  Mouth/Throat: Oropharynx is clear and moist.  Eyes: Pupils are equal, round, and reactive to light. Conjunctivae are normal.  Neck: Normal range of motion. No JVD present. No thyromegaly present.  Cardiovascular: Normal rate, regular rhythm, normal heart sounds and intact distal pulses. Exam reveals no gallop and no friction rub.  No murmur heard. Pulmonary/Chest: Effort normal and breath sounds normal. No respiratory distress. He has no wheezes. He has no rales. He exhibits no tenderness.  Abdominal: Soft. Bowel sounds are normal. He exhibits no distension and no mass. There is no tenderness. There is no rebound and no guarding.  Musculoskeletal: Normal  range of motion. He exhibits no edema or tenderness.  Lymphadenopathy:    He has no cervical adenopathy.  Neurological: He is alert and oriented to person, place, and time. He has normal reflexes. No cranial nerve deficit. He exhibits normal muscle tone. He displays a negative Romberg sign. Coordination and gait normal.  Skin: Skin is warm and dry. No rash noted.  Psychiatric: He has a normal mood and affect. His behavior is normal. Judgment and thought content normal.   Rectal - per Urology  Lab Results  Component  Value Date   WBC 5.0 12/02/2017   HGB 16.4 12/02/2017   HCT 46.5 12/02/2017   PLT 164 12/02/2017   GLUCOSE 110 (H) 12/02/2017   CHOL 189 08/19/2015   TRIG 80.0 08/19/2015   HDL 48.50 08/19/2015   LDLCALC 125 (H) 08/19/2015   ALT 13 09/30/2017   AST 18 09/30/2017   NA 141 12/02/2017   K 4.3 12/02/2017   CL 101 12/02/2017   CREATININE 1.07 12/02/2017   BUN 15 12/02/2017   CO2 27 12/02/2017   TSH 1.15 09/30/2017   PSA 0.00 (L) 01/19/2014   INR 0.95 02/12/2011   HGBA1C  01/20/2011    5.2 (NOTE)                                                                       According to the ADA Clinical Practice Recommendations for 2011, when HbA1c is used as a screening test:   >=6.5%   Diagnostic of Diabetes Mellitus           (if abnormal result  is confirmed)  5.7-6.4%   Increased risk of developing Diabetes Mellitus  References:Diagnosis and Classification of Diabetes Mellitus,Diabetes HWEX,9371,69(CVELF 1):S62-S69 and Standards of Medical Care in         Diabetes - 2011,Diabetes Care,2011,34  (Suppl 1):S11-S61.    No results found.  Assessment & Plan:   Diagnoses and all orders for this visit:  Vitamin D deficiency  Essential hypertension  Paroxysmal A-fib (HCC)  PROSTATE CANCER, HX OF  Anxiety state  Well adult exam  B12 deficiency   I am having Evan Farmer maintain his ibuprofen, cholecalciferol, sildenafil, Cyanocobalamin, apixaban, ALPRAZolam, losartan, atenolol, and flecainide.  No orders of the defined types were placed in this encounter.    Follow-up: No follow-ups on file.  Walker Kehr, MD

## 2018-03-25 NOTE — Assessment & Plan Note (Signed)
We discussed age appropriate health related issues, including available/recomended screening tests and vaccinations. We discussed a need for adhering to healthy diet and exercise. Labs/EKG were reviewed/ordered. All questions were answered. Colon due 2021

## 2018-03-28 ENCOUNTER — Other Ambulatory Visit: Payer: Self-pay | Admitting: Internal Medicine

## 2018-04-09 ENCOUNTER — Encounter: Payer: Self-pay | Admitting: Family

## 2018-04-09 ENCOUNTER — Ambulatory Visit (INDEPENDENT_AMBULATORY_CARE_PROVIDER_SITE_OTHER): Payer: 59 | Admitting: Family

## 2018-04-09 VITALS — BP 160/88 | HR 64 | Temp 98.1°F | Ht 68.5 in | Wt 177.1 lb

## 2018-04-09 DIAGNOSIS — I10 Essential (primary) hypertension: Secondary | ICD-10-CM

## 2018-04-09 DIAGNOSIS — F411 Generalized anxiety disorder: Secondary | ICD-10-CM | POA: Diagnosis not present

## 2018-04-09 MED ORDER — ALPRAZOLAM 0.5 MG PO TABS: 0.5000 mg | ORAL_TABLET | Freq: Two times a day (BID) | ORAL | 0 refills | Status: DC | PRN

## 2018-04-09 MED ORDER — SERTRALINE HCL 50 MG PO TABS: 50.0000 mg | ORAL_TABLET | Freq: Every day | ORAL | 1 refills | Status: DC

## 2018-04-09 NOTE — Progress Notes (Signed)
Evan Farmer is a 67 y.o. male with the following history as recorded in Epic[employer]:  Patient Active Problem List   Diagnosis Date Noted  . Well adult exam 01/19/2014  . TIA (transient ischemic attack) 02/02/2011  . HTN (hypertension)   . Paroxysmal A-fib ([place])   . Weight gain   . Vitamin D deficiency 03/30/2010  . Anxiety state 02/13/2010  . PALPITATIONS 02/10/2010  . BACTERIAL PNEUMONIA 01/14/2010  . B12 deficiency 08/19/2009  . HOARSENESS 08/15/2009  . CBC, ABNORMAL 08/15/2009  . PROSTATE CANCER, HX OF 08/15/2009  . DIARRHEA OF PRESUMED INFECTIOUS ORIGIN 07/10/2008  . RUPTURE, QUADRICEPS TENDON 03/02/2008  . HYPERLIPIDEMIA 10/24/2007  . DEPRESSION 10/24/2007  . Atrial fibrillation ([place]) 10/24/2007  . PROSTATITIS, RECURRENT 10/24/2007  . ERECTILE DYSFUNCTION 10/24/2007  . ELEVATED PROSTATE SPECIFIC ANTIGEN 10/24/2007    Current Outpatient Medications  Medication Sig Dispense Refill  . ALPRAZolam (XANAX) 0.5 MG tablet Take 1 tablet (0.5 mg total) by mouth 2 (two) times daily as needed for anxiety. 60 tablet 0  . apixaban (ELIQUIS) 5 MG TABS tablet Take 1 tablet (5 mg total) by mouth 2 (two) times daily. 180 tablet 3  . atenolol (TENORMIN) 50 MG tablet Take 1 tablet (50 mg total) by mouth daily. 90 tablet 3  . cholecalciferol (VITAMIN D) 1000 UNITS tablet Take 1 tablet (1,000 Units total) by mouth daily. 100 tablet 11  . Cyanocobalamin (NASCOBAL) 500 MCG/0.1ML SOLN USE 1 SPRAY IN 1 NOSTRIL   PER WEEK 12 mL 3  . flecainide (TAMBOCOR) 100 MG tablet Take 1 tablet (100 mg total) by mouth 2 (two) times daily. 180 tablet 3  . ibuprofen (ADVIL,MOTRIN) 200 MG tablet Take 200-400 mg by mouth 2 (two) times daily as needed (pain).    . vardenafil (LEVITRA) 20 MG tablet Take 1 tablet (20 mg total) by mouth daily as needed for erectile dysfunction. 20 tablet 6  . losartan (COZAAR) 25 MG tablet Take 1 tablet (25 mg total) by mouth daily. 90 tablet 3  . sertraline (ZOLOFT) 50 MG tablet Take 1  tablet (50 mg total) by mouth daily. Take 1/2 tablet x 1 week; then increase to full tablet as directed 30 tablet 1   No current facility-administered medications for this visit.     Allergies: Procainamide hcl and Quinidine  Past Medical History:  Diagnosis Date  . Depression   . HTN (hypertension)   . Hyperlipidemia   . Paroxysmal A-fib ([place])   . Prostate cancer [facility])    2010 Dr Terance Hart   . [place] spotted fever    age 72  . S/P cardiac cath April 2012   No obstructive disease. Normal LV function  . Scoliosis   . TIA (transient ischemic attack)    2012  . Vitamin B 12 deficiency   . Weight gain     Past Surgical History:  Procedure Laterality Date  . CARDIOVERSION N/A 07/10/2016   Procedure: CARDIOVERSION;  Surgeon: Lelon Perla, MD;  Location: [facility] ENDOSCOPY;  Service: Cardiovascular;  Laterality: N/A;  . CATARACT EXTRACTION W/ INTRAOCULAR LENS  IMPLANT, BILATERAL    . COLONOSCOPY  01-24-2005   TICS ONLY   . HERNIA REPAIR    . [place] and 2003   twice  . NASAL SEPTUM SURGERY    . PROSTATECTOMY  11/2008   Robotic   . TEE WITHOUT CARDIOVERSION N/A 07/10/2016   Procedure: TRANSESOPHAGEAL ECHOCARDIOGRAM (TEE);  Surgeon: Lelon Perla, MD;  Location: [facility] ENDOSCOPY;  Service: Cardiovascular;  Laterality: N/A;    Family History  Problem Relation Age of Onset  . Arrhythmia Mother   . Atrial fibrillation Mother   . Cancer Father   . Lung cancer Other   . Prostate cancer Other   . Colon cancer Paternal Uncle   . Rectal cancer Neg Hx   . Stomach cancer Neg Hx     Social History   Tobacco Use  . Smoking status: Never Smoker  . Smokeless tobacco: Never Used  Substance Use Topics  . Alcohol use: Yes    Alcohol/week: 4.8 oz    Types: 4 Glasses of wine, 4 Shots of liquor per week    Comment: Social alcohol use    Subjective:  Patient presents with concerns for increased anxiety; would like to discuss medication changes; numerous personal stressors-  recently moved/ working to sell his house, lost a friend; Has taken Zoloft in the past and done well; would like to discuss re-starting this medication; also questions if his dosage of Xanax needs to be increased slightly as well; denies being suicidal; feels he has good support system; notes that blood pressure is typically elevated in this office and better controlled at home- notes that systolic numbers average in the 140s; Denies any chest pain, shortness of breath, blurred vision or headache.   Objective:  Vitals:   04/09/18 1349  BP: (!) 160/88  Pulse: 64  Temp: 98.1 F (36.7 C)  TempSrc: Oral  SpO2: 98%  Weight: 177 lb 1.3 oz (80.3 kg)  Height: 5' 8.5" (1.74 m)    General: Well developed, well nourished, in no acute distress  Skin : Warm and dry.  Head: Normocephalic and atraumatic  Lungs: Respirations unlabored; clear to auscultation bilaterally without wheeze, rales, rhonchi  Neurologic: Alert and oriented; speech intact; face symmetrical; moves all extremities well; CNII-XII intact without focal deficit   Assessment:  1. Anxiety state   2. Essential hypertension     Plan:  1. Re-start Zoloft; patient has done well on this medication in the past; start with 25 mg x 1 week and then increase to 50 mg for now; will most likely need to go back to 100 mg; follow-up in 1 month to assess response. Will increase Xanax to 0.5 mg bid for the short-term; 2. ? Control; he is to monitor regularly at home and follow-up in 1 month when anxiety should be more controlled; if still elevated, may need to increase Losartan to 50 mg daily;   Return in about 1 month (around 05/10/2018) for blood pressure/ anxiety follow-up with Dr. Alain Marion.  No orders of the defined types were placed in this encounter.   Requested Prescriptions   Signed Prescriptions Disp Refills  . sertraline (ZOLOFT) 50 MG tablet 30 tablet 1    Sig: Take 1 tablet (50 mg total) by mouth daily. Take 1/2 tablet x 1 week; then  increase to full tablet as directed  . ALPRAZolam (XANAX) 0.5 MG tablet 60 tablet 0    Sig: Take 1 tablet (0.5 mg total) by mouth 2 (two) times daily as needed for anxiety.

## 2018-05-09 ENCOUNTER — Ambulatory Visit: Payer: 59 | Admitting: Internal Medicine

## 2018-06-10 ENCOUNTER — Ambulatory Visit (INDEPENDENT_AMBULATORY_CARE_PROVIDER_SITE_OTHER): Payer: 59 | Admitting: Internal Medicine

## 2018-06-10 ENCOUNTER — Encounter: Payer: Self-pay | Admitting: Internal Medicine

## 2018-06-10 ENCOUNTER — Other Ambulatory Visit: Payer: Self-pay | Admitting: Family

## 2018-06-10 DIAGNOSIS — F411 Generalized anxiety disorder: Secondary | ICD-10-CM | POA: Diagnosis not present

## 2018-06-10 DIAGNOSIS — I10 Essential (primary) hypertension: Secondary | ICD-10-CM | POA: Diagnosis not present

## 2018-06-10 DIAGNOSIS — R635 Abnormal weight gain: Secondary | ICD-10-CM

## 2018-06-10 DIAGNOSIS — N529 Male erectile dysfunction, unspecified: Secondary | ICD-10-CM

## 2018-06-10 DIAGNOSIS — I480 Paroxysmal atrial fibrillation: Secondary | ICD-10-CM

## 2018-06-10 MED ORDER — ALPRAZOLAM 0.5 MG PO TABS: 0.5000 mg | ORAL_TABLET | Freq: Two times a day (BID) | ORAL | 0 refills | Status: DC | PRN

## 2018-06-10 NOTE — Assessment & Plan Note (Signed)
BP at home SBP 120-130 Atenolol, Losartan

## 2018-06-10 NOTE — Progress Notes (Signed)
Subjective:  Patient ID: Evan Farmer, male    DOB: 06/23/1951  Age: 67 y.o. MRN: [id number]  CC: No chief complaint on file.   HPI Evan Farmer presents for anxiety - better on Zoloft. F/u HTN - BP good at home. F/u ED  Outpatient Medications Prior to Visit  Medication Sig Dispense Refill  . ALPRAZolam (XANAX) 0.5 MG tablet Take 1 tablet (0.5 mg total) by mouth 2 (two) times daily as needed for anxiety. 60 tablet 0  . apixaban (ELIQUIS) 5 MG TABS tablet Take 1 tablet (5 mg total) by mouth 2 (two) times daily. 180 tablet 3  . atenolol (TENORMIN) 50 MG tablet Take 1 tablet (50 mg total) by mouth daily. 90 tablet 3  . cholecalciferol (VITAMIN D) 1000 UNITS tablet Take 1 tablet (1,000 Units total) by mouth daily. 100 tablet 11  . Cyanocobalamin (NASCOBAL) 500 MCG/0.1ML SOLN USE 1 SPRAY IN 1 NOSTRIL   PER WEEK 12 mL 3  . flecainide (TAMBOCOR) 100 MG tablet Take 1 tablet (100 mg total) by mouth 2 (two) times daily. 180 tablet 3  . ibuprofen (ADVIL,MOTRIN) 200 MG tablet Take 200-400 mg by mouth 2 (two) times daily as needed (pain).    Marland Kitchen sertraline (ZOLOFT) 50 MG tablet Take 1 tablet (50 mg total) by mouth daily. Take 1/2 tablet x 1 week; then increase to full tablet as directed 30 tablet 1  . vardenafil (LEVITRA) 20 MG tablet Take 1 tablet (20 mg total) by mouth daily as needed for erectile dysfunction. 20 tablet 6  . losartan (COZAAR) 25 MG tablet Take 1 tablet (25 mg total) by mouth daily. 90 tablet 3   No facility-administered medications prior to visit.     ROS: Review of Systems  Constitutional: Negative for appetite change, fatigue and unexpected weight change.  HENT: Negative for congestion, nosebleeds, sneezing, sore throat and trouble swallowing.   Eyes: Negative for itching and visual disturbance.  Respiratory: Negative for cough.   Cardiovascular: Negative for chest pain, palpitations and leg swelling.  Gastrointestinal: Negative for abdominal distention, blood in stool,  diarrhea and nausea.  Genitourinary: Negative for frequency and hematuria.  Musculoskeletal: Negative for back pain, gait problem, joint swelling and neck pain.  Skin: Negative for rash.  Neurological: Negative for dizziness, tremors, speech difficulty and weakness.  Psychiatric/Behavioral: Negative for agitation, dysphoric mood, sleep disturbance and suicidal ideas. The patient is nervous/anxious.     Objective:  BP (!) 150/86 (BP Location: Left Arm, Patient Position: Sitting, Cuff Size: Large)   Pulse (!) 58   Temp 98.2 F (36.8 C) (Oral)   Ht 5' 8.5" (1.74 m)   Wt 179 lb (81.2 kg)   SpO2 95%   BMI 26.82 kg/m   BP Readings from Last 3 Encounters:  06/10/18 (!) 150/86  04/09/18 (!) 160/88  03/25/18 (!) 142/86    Wt Readings from Last 3 Encounters:  06/10/18 179 lb (81.2 kg)  04/09/18 177 lb 1.3 oz (80.3 kg)  03/25/18 184 lb (83.5 kg)    Physical Exam  Constitutional: He is oriented to person, place, and time. He appears well-developed. No distress.  NAD  HENT:  Mouth/Throat: Oropharynx is clear and moist.  Eyes: Pupils are equal, round, and reactive to light. Conjunctivae are normal.  Neck: Normal range of motion. No JVD present. No thyromegaly present.  Cardiovascular: Normal rate, regular rhythm, normal heart sounds and intact distal pulses. Exam reveals no gallop and no friction rub.  No murmur heard. Pulmonary/Chest:  Effort normal and breath sounds normal. No respiratory distress. He has no wheezes. He has no rales. He exhibits no tenderness.  Abdominal: Soft. Bowel sounds are normal. He exhibits no distension and no mass. There is no tenderness. There is no rebound and no guarding.  Musculoskeletal: Normal range of motion. He exhibits no edema or tenderness.  Lymphadenopathy:    He has no cervical adenopathy.  Neurological: He is alert and oriented to person, place, and time. He has normal reflexes. No cranial nerve deficit. He exhibits normal muscle tone. He  displays a negative Romberg sign. Coordination and gait normal.  Skin: Skin is warm and dry. No rash noted.  Psychiatric: He has a normal mood and affect. His behavior is normal. Judgment and thought content normal.    Lab Results  Component Value Date   WBC 3.8 (L) 03/25/2018   HGB 16.3 03/25/2018   HCT 46.9 03/25/2018   PLT 152.0 03/25/2018   GLUCOSE 91 03/25/2018   CHOL 190 03/25/2018   TRIG 101.0 03/25/2018   HDL 55.40 03/25/2018   LDLCALC 114 (H) 03/25/2018   ALT 11 03/25/2018   AST 15 03/25/2018   NA 140 03/25/2018   K 4.1 03/25/2018   CL 106 03/25/2018   CREATININE 1.01 03/25/2018   BUN 11 03/25/2018   CO2 29 03/25/2018   TSH 0.98 03/25/2018   PSA 0.00 (L) 01/19/2014   INR 0.95 02/12/2011   HGBA1C  01/20/2011    5.2 (NOTE)                                                                       According to the ADA Clinical Practice Recommendations for 2011, when HbA1c is used as a screening test:   >=6.5%   Diagnostic of Diabetes Mellitus           (if abnormal result  is confirmed)  5.7-6.4%   Increased risk of developing Diabetes Mellitus  References:Diagnosis and Classification of Diabetes Mellitus,Diabetes JSRP,5945,85(FYTWK 1):S62-S69 and Standards of Medical Care in         Diabetes - 2011,Diabetes Care,2011,34  (Suppl 1):S11-S61.    No results found.  Assessment & Plan:   There are no diagnoses linked to this encounter.   No orders of the defined types were placed in this encounter.    Follow-up: No follow-ups on file.  Walker Kehr, MD

## 2018-06-10 NOTE — Assessment & Plan Note (Signed)
Wt Readings from Last 3 Encounters:  06/10/18 179 lb (81.2 kg)  04/09/18 177 lb 1.3 oz (80.3 kg)  03/25/18 184 lb (83.5 kg)

## 2018-06-10 NOTE — Assessment & Plan Note (Signed)
[place], Levitra prn

## 2018-06-10 NOTE — Assessment & Plan Note (Signed)
Eliquis, Flecainide  

## 2018-06-10 NOTE — Assessment & Plan Note (Signed)
Better on Zoloft,  prn Xanax  Potential benefits of a long term benzodiazepines  use as well as potential risks  and complications were explained to the patient and were aknowledged.

## 2018-11-21 ENCOUNTER — Other Ambulatory Visit: Payer: Self-pay | Admitting: Internal Medicine

## 2018-12-09 ENCOUNTER — Ambulatory Visit: Payer: 59 | Admitting: Internal Medicine

## 2018-12-16 ENCOUNTER — Other Ambulatory Visit (INDEPENDENT_AMBULATORY_CARE_PROVIDER_SITE_OTHER): Payer: 59

## 2018-12-16 ENCOUNTER — Encounter: Payer: Self-pay | Admitting: Internal Medicine

## 2018-12-16 ENCOUNTER — Ambulatory Visit (INDEPENDENT_AMBULATORY_CARE_PROVIDER_SITE_OTHER): Payer: 59 | Admitting: Internal Medicine

## 2018-12-16 DIAGNOSIS — E559 Vitamin D deficiency, unspecified: Secondary | ICD-10-CM | POA: Diagnosis not present

## 2018-12-16 DIAGNOSIS — E538 Deficiency of other specified B group vitamins: Secondary | ICD-10-CM | POA: Diagnosis not present

## 2018-12-16 DIAGNOSIS — E785 Hyperlipidemia, unspecified: Secondary | ICD-10-CM

## 2018-12-16 DIAGNOSIS — I10 Essential (primary) hypertension: Secondary | ICD-10-CM | POA: Diagnosis not present

## 2018-12-16 LAB — BASIC METABOLIC PANEL
BUN: 19 mg/dL (ref 6–23)
CO2: 29 mEq/L (ref 19–32)
Calcium: 9 mg/dL (ref 8.4–10.5)
Chloride: 105 mEq/L (ref 96–112)
Creatinine, Ser: 1.03 mg/dL (ref 0.40–1.50)
GFR: 72.25 mL/min (ref >60.00)
GLUCOSE: 97 mg/dL (ref 70–99)
Potassium: 4.2 mEq/L (ref 3.5–5.1)
Sodium: 141 mEq/L (ref 135–145)

## 2018-12-16 LAB — TSH: TSH: 0.93 u[IU]/mL (ref 0.35–4.50)

## 2018-12-16 LAB — VITAMIN B12: Vitamin B-12: 455 pg/mL (ref 211–911)

## 2018-12-16 NOTE — Assessment & Plan Note (Signed)
Not on Rx cardiac CT scan for calcium scoring offered

## 2018-12-16 NOTE — Progress Notes (Signed)
Subjective:  Patient ID: Evan Farmer, male    DOB: 1950-11-26  Age: 68 y.o. MRN: [id number]  CC: No chief complaint on file.   HPI Evan Farmer presents for HTN, anxiety, A fib, B12 def f/u Nascobal used last 2 mo ago  Outpatient Medications Prior to Visit  Medication Sig Dispense Refill  . ALPRAZolam (XANAX) 0.5 MG tablet TAKE 1 TABLET 2 TIMES DAILYAS NEEDED FOR ANXIETY. 180 tablet 1  . apixaban (ELIQUIS) 5 MG TABS tablet Take 1 tablet (5 mg total) by mouth 2 (two) times daily. 180 tablet 3  . atenolol (TENORMIN) 50 MG tablet Take 1 tablet (50 mg total) by mouth daily. Please make annual appt for future refills. [phone]. 90 tablet 0  . cholecalciferol (VITAMIN D) 1000 UNITS tablet Take 1 tablet (1,000 Units total) by mouth daily. 100 tablet 11  . Cyanocobalamin (NASCOBAL) 500 MCG/0.1ML SOLN USE 1 SPRAY IN 1 NOSTRIL   PER WEEK 12 mL 3  . flecainide (TAMBOCOR) 100 MG tablet Take 1 tablet (100 mg total) by mouth 2 (two) times daily. 180 tablet 3  . ibuprofen (ADVIL,MOTRIN) 200 MG tablet Take 200-400 mg by mouth 2 (two) times daily as needed (pain).    Marland Kitchen losartan (COZAAR) 25 MG tablet Take 1 tablet (25 mg total) by mouth daily. Please make annual appt for future refills. [phone]. 90 tablet 0  . sildenafil (REVATIO) 20 MG tablet Take 20 mg by mouth 3 (three) times daily.    . sertraline (ZOLOFT) 50 MG tablet Take 1 tablet (50 mg total) by mouth daily. (Patient not taking: Reported on 12/16/2018) 30 tablet 2  . vardenafil (LEVITRA) 20 MG tablet Take 1 tablet (20 mg total) by mouth daily as needed for erectile dysfunction. (Patient not taking: Reported on 12/16/2018) 20 tablet 6   No facility-administered medications prior to visit.     ROS: Review of Systems  Constitutional: Negative for appetite change, fatigue and unexpected weight change.  HENT: Negative for congestion, nosebleeds, sneezing, sore throat and trouble swallowing.   Eyes: Negative for itching and visual  disturbance.  Respiratory: Negative for cough.   Cardiovascular: Negative for chest pain, palpitations and leg swelling.  Gastrointestinal: Negative for abdominal distention, blood in stool, diarrhea and nausea.  Genitourinary: Negative for frequency and hematuria.  Musculoskeletal: Negative for back pain, gait problem, joint swelling and neck pain.  Skin: Negative for rash.  Neurological: Negative for dizziness, tremors, speech difficulty and weakness.  Psychiatric/Behavioral: Negative for agitation, dysphoric mood, sleep disturbance and suicidal ideas. The patient is not nervous/anxious.     Objective:  BP (!) 150/84 (BP Location: Left Arm, Patient Position: Sitting, Cuff Size: Normal)   Pulse 69   Temp 97.7 F (36.5 C) (Oral)   Ht 5' 8.5" (1.74 m)   Wt 189 lb (85.7 kg)   SpO2 97%   BMI 28.32 kg/m   BP Readings from Last 3 Encounters:  12/16/18 (!) 150/84  06/10/18 (!) 150/86  04/09/18 (!) 160/88    Wt Readings from Last 3 Encounters:  12/16/18 189 lb (85.7 kg)  06/10/18 179 lb (81.2 kg)  04/09/18 177 lb 1.3 oz (80.3 kg)    Physical Exam Constitutional:      General: He is not in acute distress.    Appearance: He is well-developed.     Comments: NAD  Eyes:     Conjunctiva/sclera: Conjunctivae normal.     Pupils: Pupils are equal, round, and reactive to light.  Neck:  Musculoskeletal: Normal range of motion.     Thyroid: No thyromegaly.     Vascular: No JVD.  Cardiovascular:     Rate and Rhythm: Normal rate and regular rhythm.     Heart sounds: Normal heart sounds. No murmur. No friction rub. No gallop.   Pulmonary:     Effort: Pulmonary effort is normal. No respiratory distress.     Breath sounds: Normal breath sounds. No wheezing or rales.  Chest:     Chest wall: No tenderness.  Abdominal:     General: Bowel sounds are normal. There is no distension.     Palpations: Abdomen is soft. There is no mass.     Tenderness: There is no abdominal tenderness.  There is no guarding or rebound.  Musculoskeletal: Normal range of motion.        General: No tenderness.  Lymphadenopathy:     Cervical: No cervical adenopathy.  Skin:    General: Skin is warm and dry.     Findings: No rash.  Neurological:     Mental Status: He is alert and oriented to person, place, and time.     Cranial Nerves: No cranial nerve deficit.     Motor: No abnormal muscle tone.     Coordination: Coordination normal.     Gait: Gait normal.     Deep Tendon Reflexes: Reflexes are normal and symmetric.  Psychiatric:        Behavior: Behavior normal.        Thought Content: Thought content normal.        Judgment: Judgment normal.     Lab Results  Component Value Date   WBC 3.8 (L) 03/25/2018   HGB 16.3 03/25/2018   HCT 46.9 03/25/2018   PLT 152.0 03/25/2018   GLUCOSE 91 03/25/2018   CHOL 190 03/25/2018   TRIG 101.0 03/25/2018   HDL 55.40 03/25/2018   LDLCALC 114 (H) 03/25/2018   ALT 11 03/25/2018   AST 15 03/25/2018   NA 140 03/25/2018   K 4.1 03/25/2018   CL 106 03/25/2018   CREATININE 1.01 03/25/2018   BUN 11 03/25/2018   CO2 29 03/25/2018   TSH 0.98 03/25/2018   PSA 0.00 (L) 01/19/2014   INR 0.95 02/12/2011   HGBA1C  01/20/2011    5.2 (NOTE)                                                                       According to the ADA Clinical Practice Recommendations for 2011, when HbA1c is used as a screening test:   >=6.5%   Diagnostic of Diabetes Mellitus           (if abnormal result  is confirmed)  5.7-6.4%   Increased risk of developing Diabetes Mellitus  References:Diagnosis and Classification of Diabetes Mellitus,Diabetes QIWL,7989,21(JHERD 1):S62-S69 and Standards of Medical Care in         Diabetes - 2011,Diabetes Care,2011,34  (Suppl 1):S11-S61.    No results found.  Assessment & Plan:   There are no diagnoses linked to this encounter.   No orders of the defined types were placed in this encounter.    Follow-up: No follow-ups on  file.  Walker Kehr, MD

## 2018-12-16 NOTE — Assessment & Plan Note (Signed)
Vi D

## 2018-12-16 NOTE — Patient Instructions (Signed)

## 2018-12-16 NOTE — Assessment & Plan Note (Signed)
Labs

## 2019-02-24 ENCOUNTER — Other Ambulatory Visit: Payer: Self-pay | Admitting: Internal Medicine

## 2019-02-24 MED ORDER — ATENOLOL 50 MG PO TABS: 50.0000 mg | ORAL_TABLET | Freq: Every day | ORAL | 1 refills | Status: DC

## 2019-02-24 MED ORDER — FLECAINIDE ACETATE 100 MG PO TABS: 100.0000 mg | ORAL_TABLET | Freq: Two times a day (BID) | ORAL | 1 refills | Status: DC

## 2019-02-24 NOTE — Telephone Encounter (Signed)
Pt's medications were sent to pt's pharmacy as requested. Confirmation received.  

## 2019-02-24 NOTE — Telephone Encounter (Signed)
° ° ° °*  STAT* If patient is at the pharmacy, call can be transferred to refill team.   1. Which medications need to be refilled? (please list name of each medication and dose if known) losartan (COZAAR) 25 MG tablet, flecainide (TAMBOCOR) 100 MG tablet, atenolol (TENORMIN) 50 MG tablet  2. Which pharmacy/location (including street and city if local pharmacy) is medication to be sent to? [facility]   3. Do they need a 30 day or 90 day supply? [place]

## 2019-02-25 ENCOUNTER — Telehealth: Payer: Self-pay | Admitting: *Deleted

## 2019-02-25 ENCOUNTER — Other Ambulatory Visit: Payer: Self-pay | Admitting: *Deleted

## 2019-02-25 ENCOUNTER — Other Ambulatory Visit: Payer: Self-pay | Admitting: Internal Medicine

## 2019-02-25 MED ORDER — LOSARTAN POTASSIUM 25 MG PO TABS: ORAL_TABLET | ORAL | 1 refills | Status: DC

## 2019-02-25 MED ORDER — FLECAINIDE ACETATE 100 MG PO TABS: 100.0000 mg | ORAL_TABLET | Freq: Two times a day (BID) | ORAL | 1 refills | Status: DC

## 2019-02-25 NOTE — Telephone Encounter (Signed)
Chart re-opened in error.

## 2019-02-25 NOTE — Telephone Encounter (Signed)
Error corrected  

## 2019-02-25 NOTE — Telephone Encounter (Signed)
Error

## 2019-02-25 NOTE — Telephone Encounter (Signed)
Chart opened in error

## 2019-03-27 ENCOUNTER — Encounter: Payer: 59 | Admitting: Internal Medicine

## 2019-06-01 ENCOUNTER — Other Ambulatory Visit: Payer: Self-pay | Admitting: Internal Medicine

## 2019-06-01 NOTE — Telephone Encounter (Signed)
[place] Controlled Database Checked Last filled: 02/24/19 # 180 LOV w/you: 12/16/18 Next appt w/you: 06/16/19

## 2019-06-16 ENCOUNTER — Encounter: Payer: Self-pay | Admitting: Internal Medicine

## 2019-06-16 ENCOUNTER — Other Ambulatory Visit: Payer: Self-pay

## 2019-06-16 ENCOUNTER — Other Ambulatory Visit: Payer: 59

## 2019-06-16 ENCOUNTER — Other Ambulatory Visit (INDEPENDENT_AMBULATORY_CARE_PROVIDER_SITE_OTHER): Payer: 59

## 2019-06-16 ENCOUNTER — Ambulatory Visit (INDEPENDENT_AMBULATORY_CARE_PROVIDER_SITE_OTHER): Payer: 59 | Admitting: Internal Medicine

## 2019-06-16 VITALS — BP 160/98 | HR 66 | Temp 98.0°F | Ht 68.5 in | Wt 179.0 lb

## 2019-06-16 DIAGNOSIS — E538 Deficiency of other specified B group vitamins: Secondary | ICD-10-CM | POA: Diagnosis not present

## 2019-06-16 DIAGNOSIS — E559 Vitamin D deficiency, unspecified: Secondary | ICD-10-CM

## 2019-06-16 DIAGNOSIS — I480 Paroxysmal atrial fibrillation: Secondary | ICD-10-CM

## 2019-06-16 DIAGNOSIS — Z125 Encounter for screening for malignant neoplasm of prostate: Secondary | ICD-10-CM

## 2019-06-16 DIAGNOSIS — Z0000 Encounter for general adult medical examination without abnormal findings: Secondary | ICD-10-CM

## 2019-06-16 LAB — URINALYSIS, ROUTINE W REFLEX MICROSCOPIC
Ketones, ur: NEGATIVE
Leukocytes,Ua: NEGATIVE
Nitrite: NEGATIVE
Specific Gravity, Urine: 1.025 (ref 1.000–1.030)
Total Protein, Urine: NEGATIVE
Urine Glucose: NEGATIVE
Urobilinogen, UA: 0.2 (ref 0.0–1.0)
pH: 5.5 (ref 5.0–8.0)

## 2019-06-16 LAB — CBC WITH DIFFERENTIAL/PLATELET
Basophils Absolute: 0 10*3/uL (ref 0.0–0.1)
Basophils Relative: 0.5 % (ref 0.0–3.0)
Eosinophils Absolute: 0 10*3/uL (ref 0.0–0.7)
Eosinophils Relative: 1.1 % (ref 0.0–5.0)
HCT: 49.4 % (ref 39.0–52.0)
Hemoglobin: 16.8 g/dL (ref 13.0–17.0)
Lymphocytes Relative: 18.1 % (ref 12.0–46.0)
Lymphs Abs: 0.7 10*3/uL (ref 0.7–4.0)
MCHC: 34.1 g/dL (ref 30.0–36.0)
MCV: 101 fl — ABNORMAL HIGH (ref 78.0–100.0)
Monocytes Absolute: 0.3 10*3/uL (ref 0.1–1.0)
Monocytes Relative: 7.8 % (ref 3.0–12.0)
Neutro Abs: 2.9 10*3/uL (ref 1.4–7.7)
Neutrophils Relative %: 72.5 % (ref 43.0–77.0)
Platelets: 165 10*3/uL (ref 150.0–400.0)
RBC: 4.89 Mil/uL (ref 4.22–5.81)
RDW: 12.8 % (ref 11.5–15.5)
WBC: 4.1 10*3/uL (ref 4.0–10.5)

## 2019-06-16 LAB — BASIC METABOLIC PANEL
BUN: 18 mg/dL (ref 6–23)
CO2: 30 mEq/L (ref 19–32)
Calcium: 9.3 mg/dL (ref 8.4–10.5)
Chloride: 103 mEq/L (ref 96–112)
Creatinine, Ser: 1.14 mg/dL (ref 0.40–1.50)
GFR: 64.17 mL/min (ref >60.00)
Glucose, Bld: 99 mg/dL (ref 70–99)
Potassium: 4.5 mEq/L (ref 3.5–5.1)
Sodium: 139 mEq/L (ref 135–145)

## 2019-06-16 LAB — LIPID PANEL
Cholesterol: 214 mg/dL — ABNORMAL HIGH (ref 0–200)
HDL: 46.9 mg/dL (ref >39.00)
LDL Cholesterol: 142 mg/dL — ABNORMAL HIGH (ref 0–99)
NonHDL: 167.54
Total CHOL/HDL Ratio: 5
Triglycerides: 128 mg/dL (ref 0.0–149.0)
VLDL: 25.6 mg/dL (ref 0.0–40.0)

## 2019-06-16 LAB — HEPATIC FUNCTION PANEL
ALT: 12 U/L (ref 0–53)
AST: 16 U/L (ref 0–37)
Albumin: 4.1 g/dL (ref 3.5–5.2)
Alkaline Phosphatase: 62 U/L (ref 39–117)
Bilirubin, Direct: 0.2 mg/dL (ref 0.0–0.3)
Total Bilirubin: 1 mg/dL (ref 0.2–1.2)
Total Protein: 6.4 g/dL (ref 6.0–8.3)

## 2019-06-16 LAB — VITAMIN B12: Vitamin B-12: 448 pg/mL (ref 211–911)

## 2019-06-16 LAB — VITAMIN D 25 HYDROXY (VIT D DEFICIENCY, FRACTURES): VITD: 47.12 ng/mL (ref 30.00–100.00)

## 2019-06-16 LAB — TSH: TSH: 0.76 u[IU]/mL (ref 0.35–4.50)

## 2019-06-16 LAB — PSA: PSA: 0 ng/mL — ABNORMAL LOW (ref 0.10–4.00)

## 2019-06-16 NOTE — Assessment & Plan Note (Signed)
Not using B12 Labs

## 2019-06-16 NOTE — Assessment & Plan Note (Signed)
Meds Labs 

## 2019-06-16 NOTE — Assessment & Plan Note (Signed)
We discussed age appropriate health related issues, including available/recomended screening tests and vaccinations. We discussed a need for adhering to healthy diet and exercise. Labs were ordered to be later reviewed . All questions were answered. Colon due 2021 

## 2019-06-16 NOTE — Progress Notes (Signed)
Subjective:  Patient ID: Evan Farmer, male    DOB: 01/16/1951  Age: 68 y.o. MRN: [id number]  CC: No chief complaint on file.   HPI HISASHI AMADON presents for a well exam F/u anxiety, A fib   Outpatient Medications Prior to Visit  Medication Sig Dispense Refill  . ALPRAZolam (XANAX) 0.5 MG tablet TAKE 1 TABLET 2 TIMES DAILYAS NEEDED FOR ANXIETY. 180 tablet 1  . apixaban (ELIQUIS) 5 MG TABS tablet Take 1 tablet (5 mg total) by mouth 2 (two) times daily. 180 tablet 3  . atenolol (TENORMIN) 50 MG tablet TAKE 1 TABLET DAILY *MAKE  AN ANNUAL APPOINTMENT FOR  FURTHER REFILLS AT         [phone]* 90 tablet 1  . cholecalciferol (VITAMIN D) 1000 UNITS tablet Take 1 tablet (1,000 Units total) by mouth daily. 100 tablet 11  . Cyanocobalamin (NASCOBAL) 500 MCG/0.1ML SOLN USE 1 SPRAY IN 1 NOSTRIL   PER WEEK 12 mL 3  . flecainide (TAMBOCOR) 100 MG tablet Take 1 tablet (100 mg total) by mouth 2 (two) times daily. 180 tablet 1  . ibuprofen (ADVIL,MOTRIN) 200 MG tablet Take 200-400 mg by mouth 2 (two) times daily as needed (pain).    Marland Kitchen losartan (COZAAR) 25 MG tablet TAKE 1 TABLET DAILY *MAKE  AN ANNUAL APPOINTMENT FOR  FURTHER REFILLS AT         [phone]* 90 tablet 1  . sildenafil (REVATIO) 20 MG tablet Take 20 mg by mouth 3 (three) times daily.     No facility-administered medications prior to visit.     ROS: Review of Systems  Constitutional: Negative for appetite change, fatigue and unexpected weight change.  HENT: Negative for congestion, nosebleeds, sneezing, sore throat and trouble swallowing.   Eyes: Negative for itching and visual disturbance.  Respiratory: Negative for cough.   Cardiovascular: Negative for chest pain, palpitations and leg swelling.  Gastrointestinal: Negative for abdominal distention, blood in stool, diarrhea and nausea.  Genitourinary: Negative for frequency and hematuria.  Musculoskeletal: Positive for arthralgias. Negative for back pain, gait problem, joint  swelling and neck pain.  Skin: Negative for rash.  Neurological: Negative for dizziness, tremors, speech difficulty and weakness.  Psychiatric/Behavioral: Negative for agitation, dysphoric mood and sleep disturbance. The patient is nervous/anxious.     Objective:  BP (!) 160/98 (BP Location: Left Arm, Patient Position: Sitting, Cuff Size: Normal)   Pulse 66   Temp 98 F (36.7 C) (Oral)   Ht 5' 8.5" (1.74 m)   Wt 179 lb (81.2 kg)   SpO2 96%   BMI 26.82 kg/m   BP Readings from Last 3 Encounters:  06/16/19 (!) 160/98  12/16/18 (!) 150/84  06/10/18 (!) 150/86    Wt Readings from Last 3 Encounters:  06/16/19 179 lb (81.2 kg)  12/16/18 189 lb (85.7 kg)  06/10/18 179 lb (81.2 kg)    Physical Exam Constitutional:      General: He is not in acute distress.    Appearance: He is well-developed.     Comments: NAD  Eyes:     Conjunctiva/sclera: Conjunctivae normal.     Pupils: Pupils are equal, round, and reactive to light.  Neck:     Musculoskeletal: Normal range of motion.     Thyroid: No thyromegaly.     Vascular: No JVD.  Cardiovascular:     Rate and Rhythm: Normal rate and regular rhythm.     Heart sounds: Normal heart sounds. No murmur. No friction rub. No  gallop.   Pulmonary:     Effort: Pulmonary effort is normal. No respiratory distress.     Breath sounds: Normal breath sounds. No wheezing or rales.  Chest:     Chest wall: No tenderness.  Abdominal:     General: Bowel sounds are normal. There is no distension.     Palpations: Abdomen is soft. There is no mass.     Tenderness: There is no abdominal tenderness. There is no guarding or rebound.  Musculoskeletal: Normal range of motion.        General: No tenderness.  Lymphadenopathy:     Cervical: No cervical adenopathy.  Skin:    General: Skin is warm and dry.     Findings: No rash.  Neurological:     Mental Status: He is alert and oriented to person, place, and time.     Cranial Nerves: No cranial nerve  deficit.     Motor: No abnormal muscle tone.     Coordination: Coordination normal.     Gait: Gait normal.     Deep Tendon Reflexes: Reflexes are normal and symmetric.  Psychiatric:        Behavior: Behavior normal.        Thought Content: Thought content normal.        Judgment: Judgment normal.   pt deferred rectal  Lab Results  Component Value Date   WBC 3.8 (L) 03/25/2018   HGB 16.3 03/25/2018   HCT 46.9 03/25/2018   PLT 152.0 03/25/2018   GLUCOSE 97 12/16/2018   CHOL 190 03/25/2018   TRIG 101.0 03/25/2018   HDL 55.40 03/25/2018   LDLCALC 114 (H) 03/25/2018   ALT 11 03/25/2018   AST 15 03/25/2018   NA 141 12/16/2018   K 4.2 12/16/2018   CL 105 12/16/2018   CREATININE 1.03 12/16/2018   BUN 19 12/16/2018   CO2 29 12/16/2018   TSH 0.93 12/16/2018   PSA 0.00 (L) 01/19/2014   INR 0.95 02/12/2011   HGBA1C  01/20/2011    5.2 (NOTE)                                                                       According to the ADA Clinical Practice Recommendations for 2011, when HbA1c is used as a screening test:   >=6.5%   Diagnostic of Diabetes Mellitus           (if abnormal result  is confirmed)  5.7-6.4%   Increased risk of developing Diabetes Mellitus  References:Diagnosis and Classification of Diabetes Mellitus,Diabetes TDSK,8768,11(XBWIO 1):S62-S69 and Standards of Medical Care in         Diabetes - 2011,Diabetes Care,2011,34  (Suppl 1):S11-S61.    No results found.  Assessment & Plan:   There are no diagnoses linked to this encounter.   No orders of the defined types were placed in this encounter.    Follow-up: No follow-ups on file.  Walker Kehr, MD

## 2019-06-16 NOTE — Assessment & Plan Note (Signed)
Dr Rayann Heman

## 2019-06-16 NOTE — Assessment & Plan Note (Signed)
Vit D 

## 2019-06-24 ENCOUNTER — Other Ambulatory Visit: Payer: Self-pay

## 2019-06-24 ENCOUNTER — Ambulatory Visit (INDEPENDENT_AMBULATORY_CARE_PROVIDER_SITE_OTHER): Payer: 59 | Admitting: Internal Medicine

## 2019-06-24 ENCOUNTER — Encounter: Payer: Self-pay | Admitting: Internal Medicine

## 2019-06-24 VITALS — BP 124/80 | HR 59 | Ht 68.5 in | Wt 178.0 lb

## 2019-06-24 DIAGNOSIS — I10 Essential (primary) hypertension: Secondary | ICD-10-CM

## 2019-06-24 DIAGNOSIS — I480 Paroxysmal atrial fibrillation: Secondary | ICD-10-CM

## 2019-06-24 MED ORDER — APIXABAN 5 MG PO TABS: 5.0000 mg | ORAL_TABLET | Freq: Two times a day (BID) | ORAL | 3 refills | Status: DC

## 2019-06-24 NOTE — Progress Notes (Signed)
PCP: Plotnikov, Evie Lacks, MD   Primary EP: Dr Chipper Herb is a 68 y.o. male who presents today for routine electrophysiology followup.  Since last being seen in our clinic, the patient reports doing very well.  Today, he denies symptoms of palpitations, chest pain, shortness of breath,  lower extremity edema, dizziness, presyncope, or syncope.  The patient is otherwise without complaint today.   Past Medical History:  Diagnosis Date   Depression    HTN (hypertension)    Hyperlipidemia    Paroxysmal A-fib [facility])    Prostate cancer [facility])    2010 Dr Terance Hart    [facility] spotted fever    age 68   S/P cardiac cath April 2012   No obstructive disease. Normal LV function   Scoliosis    TIA (transient ischemic attack)    2012   Vitamin B 12 deficiency    Weight gain    Past Surgical History:  Procedure Laterality Date   CARDIOVERSION N/A 07/10/2016   Procedure: CARDIOVERSION;  Surgeon: Lelon Perla, MD;  Location[facility] ENDOSCOPY;  Service: Cardiovascular;  Laterality: N/A;   CATARACT EXTRACTION W/ INTRAOCULAR LENS  IMPLANT, BILATERAL     COLONOSCOPY  01-24-2005   TICS ONLY    HERNIA REPAIR     HERNIA REPAIR  1995 and 2003   twice   NASAL SEPTUM SURGERY     PROSTATECTOMY  11/2008   Robotic    TEE WITHOUT CARDIOVERSION N/A 07/10/2016   Procedure: TRANSESOPHAGEAL ECHOCARDIOGRAM (TEE);  Surgeon: Lelon Perla, MD;  Location: [facility] ENDOSCOPY;  Service: Cardiovascular;  Laterality: N/A;    ROS- all systems are reviewed and negatives except as per HPI above  Current Outpatient Medications  Medication Sig Dispense Refill   ALPRAZolam (XANAX) 0.5 MG tablet TAKE 1 TABLET 2 TIMES DAILYAS NEEDED FOR ANXIETY. 180 tablet 1   apixaban (ELIQUIS) 5 MG TABS tablet Take 1 tablet (5 mg total) by mouth 2 (two) times daily. 180 tablet 3   atenolol (TENORMIN) 50 MG tablet TAKE 1 TABLET DAILY *MAKE  AN ANNUAL APPOINTMENT FOR  FURTHER REFILLS AT          [phone]* 90 tablet 1   cholecalciferol (VITAMIN D) 1000 UNITS tablet Take 1 tablet (1,000 Units total) by mouth daily. 100 tablet 11   Cyanocobalamin (NASCOBAL) 500 MCG/0.1ML SOLN USE 1 SPRAY IN 1 NOSTRIL   PER WEEK 12 mL 3   flecainide (TAMBOCOR) 100 MG tablet Take 1 tablet (100 mg total) by mouth 2 (two) times daily. 180 tablet 1   ibuprofen (ADVIL,MOTRIN) 200 MG tablet Take 200-400 mg by mouth 2 (two) times daily as needed (pain).     losartan (COZAAR) 25 MG tablet TAKE 1 TABLET DAILY *MAKE  AN ANNUAL APPOINTMENT FOR  FURTHER REFILLS AT         [phone]* 90 tablet 1   sildenafil (REVATIO) 20 MG tablet Take 20 mg by mouth 3 (three) times daily.     No current facility-administered medications for this visit.     Physical Exam: Vitals:   06/24/19 1145  BP: 124/80  Pulse: (!) 59  SpO2: 97%  Weight: 178 lb (80.7 kg)  Height: 5' 8.5" (1.74 m)    GEN- The patient is well appearing, alert and oriented x 3 today.   Head- normocephalic, atraumatic Eyes-  Sclera clear, conjunctiva pink Ears- hearing intact Oropharynx- clear Lungs- Clear to ausculation bilaterally, normal work of breathing Heart- Regular rate and rhythm,  no murmurs, rubs or gallops, PMI not laterally displaced GI- soft, NT, ND, + BS Extremities- no clubbing, cyanosis, or edema  Wt Readings from Last 3 Encounters:  06/24/19 178 lb (80.7 kg)  06/16/19 179 lb (81.2 kg)  12/16/18 189 lb (85.7 kg)    EKG tracing ordered today is personally reviewed and shows sinus rhythm 59 bpm, PR 188 msec, QRS 112 msec, nonspecific St/ T changes  Assessment and Plan:  1. paroxysmal atrial fibrillation Well controlled chads2vasc score is at least 2 Continue eliquis  2. HTN Stable No change required today  Follow-up in the AF clinic every year I will see when needed  Thompson Grayer MD, [facility] 06/24/2019 12:00 PM

## 2019-06-24 NOTE — Patient Instructions (Addendum)
Medication Instructions:  Your physician recommends that you continue on your current medications as directed. Please refer to the Current Medication list given to you today.  * If you need a refill on your cardiac medications before your next appointment, please call your pharmacy.   Labwork: None ordered  Testing/Procedures: None ordered  Follow-Up: Your physician wants you to follow-up in: 1 year with the AFib clinic.  You will receive a reminder letter in the mail two months in advance. If you don't receive a letter, please call our office to schedule the follow-up appointment.  Thank you for choosing [facility]!!

## 2019-11-26 ENCOUNTER — Other Ambulatory Visit: Payer: Self-pay | Admitting: Internal Medicine

## 2019-11-26 MED ORDER — ATENOLOL 50 MG PO TABS: ORAL_TABLET | ORAL | 0 refills | Status: DC

## 2019-11-26 MED ORDER — ATENOLOL 50 MG PO TABS: 50.0000 mg | ORAL_TABLET | Freq: Every day | ORAL | 2 refills | Status: DC

## 2019-11-26 NOTE — Telephone Encounter (Signed)
Pt's medication was sent to both pharmacies as requested. [facility] for a 10 day supply until mail order arrives. Confirmation received for both.

## 2019-11-26 NOTE — Telephone Encounter (Signed)
Pt's medication was sent to pt's pharmacy as requested. Confirmation received.  °

## 2019-12-09 ENCOUNTER — Other Ambulatory Visit: Payer: Self-pay | Admitting: Internal Medicine

## 2019-12-10 ENCOUNTER — Other Ambulatory Visit: Payer: Self-pay | Admitting: Internal Medicine

## 2019-12-10 MED ORDER — FLECAINIDE ACETATE 100 MG PO TABS: 100.0000 mg | ORAL_TABLET | Freq: Two times a day (BID) | ORAL | 0 refills | Status: DC

## 2019-12-10 NOTE — Telephone Encounter (Signed)
Pt's medication was sent to pt's pharmacy as requested. Pt needed a 10 day supply until mail order arrives. Confirmation received.

## 2019-12-11 ENCOUNTER — Telehealth: Payer: Self-pay | Admitting: Internal Medicine

## 2019-12-11 DIAGNOSIS — E785 Hyperlipidemia, unspecified: Secondary | ICD-10-CM

## 2019-12-11 NOTE — Telephone Encounter (Signed)
    Patient requesting order for Cardiac CT. Patient states this test was mentioned during last ov with Dr Alain Marion

## 2019-12-14 NOTE — Telephone Encounter (Signed)
Ordered,  thanks  He needs to call this number to schedule an appointment- Tel # is [phone]

## 2019-12-15 ENCOUNTER — Ambulatory Visit: Payer: 59 | Admitting: Internal Medicine

## 2019-12-15 NOTE — Telephone Encounter (Signed)
Notified pt w/MD response.../lmb 

## 2019-12-22 ENCOUNTER — Ambulatory Visit (INDEPENDENT_AMBULATORY_CARE_PROVIDER_SITE_OTHER): Payer: 59 | Admitting: Internal Medicine

## 2019-12-22 ENCOUNTER — Other Ambulatory Visit: Payer: Self-pay

## 2019-12-22 ENCOUNTER — Encounter: Payer: Self-pay | Admitting: Internal Medicine

## 2019-12-22 VITALS — BP 160/94 | HR 60 | Temp 97.8°F | Ht 68.5 in | Wt 181.2 lb

## 2019-12-22 DIAGNOSIS — E538 Deficiency of other specified B group vitamins: Secondary | ICD-10-CM

## 2019-12-22 DIAGNOSIS — I480 Paroxysmal atrial fibrillation: Secondary | ICD-10-CM

## 2019-12-22 DIAGNOSIS — M79639 Pain in unspecified forearm: Secondary | ICD-10-CM | POA: Insufficient documentation

## 2019-12-22 DIAGNOSIS — I10 Essential (primary) hypertension: Secondary | ICD-10-CM | POA: Diagnosis not present

## 2019-12-22 DIAGNOSIS — E559 Vitamin D deficiency, unspecified: Secondary | ICD-10-CM | POA: Diagnosis not present

## 2019-12-22 DIAGNOSIS — M79632 Pain in left forearm: Secondary | ICD-10-CM

## 2019-12-22 LAB — BASIC METABOLIC PANEL
BUN: 19 mg/dL (ref 6–23)
CO2: 30 mEq/L (ref 19–32)
Calcium: 8.6 mg/dL (ref 8.4–10.5)
Chloride: 101 mEq/L (ref 96–112)
Creatinine, Ser: 1.14 mg/dL (ref 0.40–1.50)
GFR: 64.07 mL/min (ref >60.00)
Glucose, Bld: 107 mg/dL — ABNORMAL HIGH (ref 70–99)
Potassium: 4 mEq/L (ref 3.5–5.1)
Sodium: 136 mEq/L (ref 135–145)

## 2019-12-22 LAB — VITAMIN D 25 HYDROXY (VIT D DEFICIENCY, FRACTURES): VITD: 55.52 ng/mL (ref 30.00–100.00)

## 2019-12-22 LAB — VITAMIN B12: Vitamin B-12: 458 pg/mL (ref 211–911)

## 2019-12-22 MED ORDER — ALPRAZOLAM 0.5 MG PO TABS: ORAL_TABLET | ORAL | 1 refills | Status: DC

## 2019-12-22 NOTE — Assessment & Plan Note (Signed)
BP Readings from Last 3 Encounters:  12/22/19 (!) 160/94  06/24/19 124/80  06/16/19 (!) 160/98

## 2019-12-22 NOTE — Assessment & Plan Note (Signed)
Continue with B12.  Obtain labs

## 2019-12-22 NOTE — Assessment & Plan Note (Signed)
Labs Vit D 

## 2019-12-22 NOTE — Assessment & Plan Note (Signed)
left forearm new pain -question internal bruise pain versus other, resolving.  Will need x-ray left forearm if pain relapsed.

## 2019-12-22 NOTE — Progress Notes (Signed)
Subjective:  Patient ID: Evan Farmer, male    DOB: 10/20/1951  Age: 69 y.o. MRN: [id number]  CC: No chief complaint on file.   HPI Evan Farmer presents for A fib, anxiety, HTN C/o R forearm pain x 12 mo F/u B12 def - no taking B12  Outpatient Medications Prior to Visit  Medication Sig Dispense Refill  . ALPRAZolam (XANAX) 0.5 MG tablet TAKE 1 TABLET 2 TIMES DAILYAS NEEDED FOR ANXIETY. 180 tablet 1  . apixaban (ELIQUIS) 5 MG TABS tablet Take 1 tablet (5 mg total) by mouth 2 (two) times daily. 180 tablet 3  . atenolol (TENORMIN) 50 MG tablet Take 1 tablet (50 mg total) by mouth daily. 90 tablet 2  . cholecalciferol (VITAMIN D) 1000 UNITS tablet Take 1 tablet (1,000 Units total) by mouth daily. 100 tablet 11  . Cyanocobalamin (NASCOBAL) 500 MCG/0.1ML SOLN USE 1 SPRAY IN 1 NOSTRIL   PER WEEK 12 mL 3  . flecainide (TAMBOCOR) 100 MG tablet Take 1 tablet (100 mg total) by mouth 2 (two) times daily. 20 tablet 0  . ibuprofen (ADVIL,MOTRIN) 200 MG tablet Take 200-400 mg by mouth 2 (two) times daily as needed (pain).    Marland Kitchen losartan (COZAAR) 25 MG tablet TAKE 1 TABLET DAILY * MAKE AN APPOINTMENT FOR FURTHER REFILLS * 90 tablet 1  . sildenafil (REVATIO) 20 MG tablet Take 20 mg by mouth 3 (three) times daily.     No facility-administered medications prior to visit.    ROS: Review of Systems  Constitutional: Negative for appetite change, fatigue and unexpected weight change.  HENT: Negative for congestion, nosebleeds, sneezing, sore throat and trouble swallowing.   Eyes: Negative for itching and visual disturbance.  Respiratory: Negative for cough.   Cardiovascular: Negative for chest pain, palpitations and leg swelling.  Gastrointestinal: Negative for abdominal distention, blood in stool, diarrhea and nausea.  Genitourinary: Negative for frequency and hematuria.  Musculoskeletal: Positive for arthralgias. Negative for back pain, gait problem, joint swelling and neck pain.  Skin: Negative  for rash.  Neurological: Negative for dizziness, tremors, speech difficulty and weakness.  Psychiatric/Behavioral: Negative for agitation, dysphoric mood and sleep disturbance. The patient is not nervous/anxious.     Objective:  There were no vitals taken for this visit.  BP Readings from Last 3 Encounters:  06/24/19 124/80  06/16/19 (!) 160/98  12/16/18 (!) 150/84    Wt Readings from Last 3 Encounters:  06/24/19 178 lb (80.7 kg)  06/16/19 179 lb (81.2 kg)  12/16/18 189 lb (85.7 kg)    Physical Exam Constitutional:      General: He is not in acute distress.    Appearance: He is well-developed.     Comments: NAD  Eyes:     Conjunctiva/sclera: Conjunctivae normal.     Pupils: Pupils are equal, round, and reactive to light.  Neck:     Thyroid: No thyromegaly.     Vascular: No JVD.  Cardiovascular:     Rate and Rhythm: Normal rate and regular rhythm.     Heart sounds: Normal heart sounds. No murmur. No friction rub. No gallop.   Pulmonary:     Effort: Pulmonary effort is normal. No respiratory distress.     Breath sounds: Normal breath sounds. No wheezing or rales.  Chest:     Chest wall: No tenderness.  Abdominal:     General: Bowel sounds are normal. There is no distension.     Palpations: Abdomen is soft. There is no mass.  Tenderness: There is no abdominal tenderness. There is no guarding or rebound.  Musculoskeletal:        General: No tenderness. Normal range of motion.     Cervical back: Normal range of motion.  Lymphadenopathy:     Cervical: No cervical adenopathy.  Skin:    General: Skin is warm and dry.     Findings: No rash.  Neurological:     Mental Status: He is alert and oriented to person, place, and time.     Cranial Nerves: No cranial nerve deficit.     Motor: No abnormal muscle tone.     Coordination: Coordination normal.     Gait: Gait normal.     Deep Tendon Reflexes: Reflexes are normal and symmetric.  Psychiatric:        Behavior:  Behavior normal.        Thought Content: Thought content normal.        Judgment: Judgment normal.   L forearm NT  Lab Results  Component Value Date   WBC 4.1 06/16/2019   HGB 16.8 06/16/2019   HCT 49.4 06/16/2019   PLT 165.0 06/16/2019   GLUCOSE 99 06/16/2019   CHOL 214 (H) 06/16/2019   TRIG 128.0 06/16/2019   HDL 46.90 06/16/2019   LDLCALC 142 (H) 06/16/2019   ALT 12 06/16/2019   AST 16 06/16/2019   NA 139 06/16/2019   K 4.5 06/16/2019   CL 103 06/16/2019   CREATININE 1.14 06/16/2019   BUN 18 06/16/2019   CO2 30 06/16/2019   TSH 0.76 06/16/2019   PSA 0.00 (L) 06/16/2019   INR 0.95 02/12/2011   HGBA1C  01/20/2011    5.2 (NOTE)                                                                       According to the ADA Clinical Practice Recommendations for 2011, when HbA1c is used as a screening test:   >=6.5%   Diagnostic of Diabetes Mellitus           (if abnormal result  is confirmed)  5.7-6.4%   Increased risk of developing Diabetes Mellitus  References:Diagnosis and Classification of Diabetes Mellitus,Diabetes [id number] 1):S62-S69 and Standards of Medical Care in         Diabetes - 2011,Diabetes Care,2011,34  (Suppl 1):S11-S61.    No results found.  Assessment & Plan:    Walker Kehr, MD

## 2019-12-22 NOTE — Assessment & Plan Note (Signed)
In NSR BMET

## 2019-12-22 NOTE — Assessment & Plan Note (Signed)
In normal sinus rhythm now Continue with Eliquis and flecainide

## 2019-12-22 NOTE — Patient Instructions (Signed)
Cardiac CT calcium scoring test $150 Tel # is [phone]  

## 2019-12-28 ENCOUNTER — Ambulatory Visit: Payer: 59 | Attending: Internal Medicine

## 2019-12-29 ENCOUNTER — Encounter: Payer: Self-pay | Admitting: Internal Medicine

## 2020-02-19 ENCOUNTER — Ambulatory Visit (INDEPENDENT_AMBULATORY_CARE_PROVIDER_SITE_OTHER)
Admission: RE | Admit: 2020-02-19 | Discharge: 2020-02-19 | Disposition: A | Discharge status: Home / Self Care | Payer: Self-pay | Source: Ambulatory Visit | Attending: Internal Medicine | Admitting: Internal Medicine

## 2020-02-19 ENCOUNTER — Other Ambulatory Visit: Payer: Self-pay

## 2020-02-19 DIAGNOSIS — E785 Hyperlipidemia, unspecified: Secondary | ICD-10-CM

## 2020-03-04 ENCOUNTER — Telehealth: Payer: Self-pay | Admitting: *Deleted

## 2020-03-04 NOTE — Telephone Encounter (Signed)
[email or web address]

## 2020-03-04 NOTE — Telephone Encounter (Signed)
-----   Message from Thompson Grayer, MD sent at 03/01/2020  9:18 PM EDT ----- Results reviewed.  Sonia Baller, please inform pt of result. I will route to primary care also.

## 2020-05-31 ENCOUNTER — Other Ambulatory Visit: Payer: Self-pay | Admitting: Internal Medicine

## 2020-06-01 NOTE — Telephone Encounter (Signed)
[place] Controlled Database Checked Last filled: 03/03/2020 (180) LOV w/you: 12/22/2019 Next appt w/you: 06/20/2020

## 2020-06-20 ENCOUNTER — Other Ambulatory Visit: Payer: Self-pay

## 2020-06-20 ENCOUNTER — Encounter: Payer: Self-pay | Admitting: Internal Medicine

## 2020-06-20 ENCOUNTER — Ambulatory Visit (INDEPENDENT_AMBULATORY_CARE_PROVIDER_SITE_OTHER): Payer: 59 | Admitting: Internal Medicine

## 2020-06-20 VITALS — BP 130/90 | HR 61 | Temp 97.5°F | Ht 68.5 in | Wt 182.0 lb

## 2020-06-20 DIAGNOSIS — Z0000 Encounter for general adult medical examination without abnormal findings: Secondary | ICD-10-CM | POA: Diagnosis not present

## 2020-06-20 DIAGNOSIS — E538 Deficiency of other specified B group vitamins: Secondary | ICD-10-CM | POA: Diagnosis not present

## 2020-06-20 DIAGNOSIS — I480 Paroxysmal atrial fibrillation: Secondary | ICD-10-CM | POA: Diagnosis not present

## 2020-06-20 DIAGNOSIS — I10 Essential (primary) hypertension: Secondary | ICD-10-CM

## 2020-06-20 DIAGNOSIS — E785 Hyperlipidemia, unspecified: Secondary | ICD-10-CM

## 2020-06-20 DIAGNOSIS — E559 Vitamin D deficiency, unspecified: Secondary | ICD-10-CM

## 2020-06-20 MED ORDER — APIXABAN 5 MG PO TABS
5.0000 mg | ORAL_TABLET | Freq: Two times a day (BID) | ORAL | 1 refills | Status: DC
Start: 2020-06-20 — End: 2020-12-07

## 2020-06-20 MED ORDER — APIXABAN 5 MG PO TABS: 5.0000 mg | ORAL_TABLET | Freq: Two times a day (BID) | ORAL | 3 refills | Status: DC

## 2020-06-20 NOTE — Assessment & Plan Note (Signed)
We discussed age appropriate health related issues, including available/recomended screening tests and vaccinations. We discussed a need for adhering to healthy diet and exercise. Labs were ordered to be later reviewed . All questions were answered. Colon due 2021

## 2020-06-20 NOTE — Progress Notes (Signed)
Subjective:  Patient ID: Evan Farmer, male    DOB: 12-11-1950  Age: 69 y.o. MRN: [id number]  CC: No chief complaint on file.   HPI SIXTO BOWDISH presents for A fib, anxiety, HTN  Well exam  Outpatient Medications Prior to Visit  Medication Sig Dispense Refill  . ALPRAZolam (XANAX) 0.5 MG tablet TAKE 1 TABLET 2 TIMES DAILYAS NEEDED FOR ANXIETY. 180 tablet 1  . apixaban (ELIQUIS) 5 MG TABS tablet Take 1 tablet (5 mg total) by mouth 2 (two) times daily. 180 tablet 3  . atenolol (TENORMIN) 50 MG tablet Take 1 tablet (50 mg total) by mouth daily. 90 tablet 2  . cholecalciferol (VITAMIN D) 1000 UNITS tablet Take 1 tablet (1,000 Units total) by mouth daily. 100 tablet 11  . Cyanocobalamin (NASCOBAL) 500 MCG/0.1ML SOLN USE 1 SPRAY IN 1 NOSTRIL   PER WEEK 12 mL 3  . flecainide (TAMBOCOR) 100 MG tablet TAKE 1 TABLET TWICE A DAY. 180 tablet 0  . ibuprofen (ADVIL,MOTRIN) 200 MG tablet Take 200-400 mg by mouth 2 (two) times daily as needed (pain).    Marland Kitchen losartan (COZAAR) 25 MG tablet TAKE 1 TABLET DAILY 90 tablet 0  . sildenafil (REVATIO) 20 MG tablet Take 20 mg by mouth 3 (three) times daily.     No facility-administered medications prior to visit.    ROS: Review of Systems  Constitutional: Negative for appetite change, fatigue and unexpected weight change.  HENT: Negative for congestion, nosebleeds, sneezing, sore throat and trouble swallowing.   Eyes: Negative for itching and visual disturbance.  Respiratory: Negative for cough.   Cardiovascular: Positive for palpitations. Negative for chest pain and leg swelling.  Gastrointestinal: Negative for abdominal distention, blood in stool, diarrhea and nausea.  Genitourinary: Negative for frequency and hematuria.  Musculoskeletal: Negative for back pain, gait problem, joint swelling and neck pain.  Skin: Negative for rash.  Neurological: Negative for dizziness, tremors, speech difficulty and weakness.  Psychiatric/Behavioral: Negative for  agitation, dysphoric mood and sleep disturbance. The patient is not nervous/anxious.     Objective:  BP 130/90 (BP Location: Left Arm, Patient Position: Sitting, Cuff Size: Large)   Pulse 61   Temp (!) 97.5 F (36.4 C) (Oral)   Ht 5' 8.5" (1.74 m)   Wt 182 lb (82.6 kg)   SpO2 95%   BMI 27.27 kg/m   BP Readings from Last 3 Encounters:  06/20/20 130/90  12/22/19 (!) 160/94  06/24/19 124/80    Wt Readings from Last 3 Encounters:  06/20/20 182 lb (82.6 kg)  12/22/19 181 lb 4 oz (82.2 kg)  06/24/19 178 lb (80.7 kg)    Physical Exam Constitutional:      General: He is not in acute distress.    Appearance: He is well-developed.     Comments: NAD  Eyes:     Conjunctiva/sclera: Conjunctivae normal.     Pupils: Pupils are equal, round, and reactive to light.  Neck:     Thyroid: No thyromegaly.     Vascular: No JVD.  Cardiovascular:     Rate and Rhythm: Normal rate and regular rhythm.     Heart sounds: Normal heart sounds. No murmur heard.  No friction rub. No gallop.   Pulmonary:     Effort: Pulmonary effort is normal. No respiratory distress.     Breath sounds: Normal breath sounds. No wheezing or rales.  Chest:     Chest wall: No tenderness.  Abdominal:     General: Bowel sounds  are normal. There is no distension.     Palpations: Abdomen is soft. There is no mass.     Tenderness: There is no abdominal tenderness. There is no guarding or rebound.  Musculoskeletal:        General: No tenderness. Normal range of motion.     Cervical back: Normal range of motion.  Lymphadenopathy:     Cervical: No cervical adenopathy.  Skin:    General: Skin is warm and dry.     Findings: No rash.  Neurological:     Mental Status: He is alert and oriented to person, place, and time.     Cranial Nerves: No cranial nerve deficit.     Motor: No abnormal muscle tone.     Coordination: Coordination normal.     Gait: Gait normal.     Deep Tendon Reflexes: Reflexes are normal and  symmetric.  Psychiatric:        Behavior: Behavior normal.        Thought Content: Thought content normal.        Judgment: Judgment normal.      Rectal - Per GI  Lab Results  Component Value Date   WBC 4.1 06/16/2019   HGB 16.8 06/16/2019   HCT 49.4 06/16/2019   PLT 165.0 06/16/2019   GLUCOSE 107 (H) 12/22/2019   CHOL 214 (H) 06/16/2019   TRIG 128.0 06/16/2019   HDL 46.90 06/16/2019   LDLCALC 142 (H) 06/16/2019   ALT 12 06/16/2019   AST 16 06/16/2019   NA 136 12/22/2019   K 4.0 12/22/2019   CL 101 12/22/2019   CREATININE 1.14 12/22/2019   BUN 19 12/22/2019   CO2 30 12/22/2019   TSH 0.76 06/16/2019   PSA 0.00 (L) 06/16/2019   INR 0.95 02/12/2011   HGBA1C  01/20/2011    5.2 (NOTE)                                                                       According to the ADA Clinical Practice Recommendations for 2011, when HbA1c is used as a screening test:   >=6.5%   Diagnostic of Diabetes Mellitus           (if abnormal result  is confirmed)  5.7-6.4%   Increased risk of developing Diabetes Mellitus  References:Diagnosis and Classification of Diabetes Mellitus,Diabetes MVHQ,4696,29(BMWUX 1):S62-S69 and Standards of Medical Care in         Diabetes - 2011,Diabetes Care,2011,34  (Suppl 1):S11-S61.    CT CARDIAC SCORING  Addendum Date: 02/19/2020   ADDENDUM REPORT: 02/19/2020 16:44 CLINICAL DATA:  67M for with hypertension and atrial fibrillation for risk stratification EXAM: Coronary Calcium Score TECHNIQUE: The patient was scanned on a [employer]. Axial non-contrast 3 mm slices were carried out through the heart. The data set was analyzed on a dedicated work station and scored using the [place]. FINDINGS: Non-cardiac: See separate report from [facility]. Ascending Aorta: Ascending aorta mildly dilated. 3.9 cm. Minimal calcification at the aortic root and the descending aorta. Pericardium: Normal Coronary arteries: Normal coronary anatomy and origin.  Minimal calcification noted in the proximal LAD. IMPRESSION: Coronary calcium score of 0.825. This was 20th percentile for age and sex matched control. Minimal calcification noted  in the proximal LAD. Minimal calcification in the aortic root and descending aorta. Ascending aorta mildly dilated. Skeet Latch, MD Electronically Signed   By: Skeet Latch   On: 02/19/2020 16:44   Result Date: 02/19/2020 EXAM: OVER-READ INTERPRETATION  CT CHEST The following report is an over-read performed by radiologist Dr. Abigail Miyamoto of [facility], [place] on 02/19/2020. This over-read does not include interpretation of cardiac or coronary anatomy or pathology. The calcium score interpretation by the cardiologist is attached. COMPARISON:  Chest radiograph 02/11/2011. FINDINGS: Vascular: Markedly tortuous thoracic aorta.  Aortic atherosclerosis. Mediastinum/Nodes: No imaged thoracic adenopathy. Mildly dilated esophagus with fluid level within on 15/4. Lungs/Pleura: Trace right greater than left pleural effusions. Left greater than right base scarring. Upper Abdomen: Low-density high left liver lobe lesions are suboptimally evaluated but likely cysts. Maximally 2.0 cm. Normal imaged portions of the stomach, spleen, adrenal glands, kidneys. Musculoskeletal: Marked convex right thoracic spine curvature IMPRESSION: 1.  No acute findings in the imaged extracardiac chest. 2. Trace bilateral pleural effusions. 3. Esophageal air fluid level suggests dysmotility or gastroesophageal reflux. 4. Aortic Atherosclerosis (ICD10-I70.0). Electronically Signed: By: Abigail Miyamoto M.D. On: 02/19/2020 12:12    Assessment & Plan:    Walker Kehr, MD

## 2020-06-20 NOTE — Assessment & Plan Note (Signed)
On B12 

## 2020-06-20 NOTE — Assessment & Plan Note (Signed)
Eliquis, Flecainide

## 2020-06-20 NOTE — Assessment & Plan Note (Signed)
Atenolol, Flecanide

## 2020-06-20 NOTE — Assessment & Plan Note (Signed)
Atenolol, Losartan

## 2020-06-20 NOTE — Assessment & Plan Note (Signed)
Vit D 

## 2020-06-20 NOTE — Assessment & Plan Note (Signed)
Not on Rx 4/21 CT IMPRESSION: Coronary calcium score of 0.825. This was 20th percentile for age and sex matched control.  Minimal calcification noted in the proximal LAD.  Minimal calcification in the aortic root and descending aorta.

## 2020-06-21 LAB — URINALYSIS
Bilirubin Urine: NEGATIVE
Glucose, UA: NEGATIVE
Hgb urine dipstick: NEGATIVE
Ketones, ur: NEGATIVE
Leukocytes,Ua: NEGATIVE
Nitrite: NEGATIVE
Protein, ur: NEGATIVE
Specific Gravity, Urine: 1.008 (ref 1.001–1.03)
pH: 5.5 (ref 5.0–8.0)

## 2020-06-21 LAB — CBC WITH DIFFERENTIAL/PLATELET
Absolute Monocytes: 348 cells/uL (ref 200–950)
Basophils Absolute: 22 cells/uL (ref 0–200)
Basophils Relative: 0.5 %
Eosinophils Absolute: 39 cells/uL (ref 15–500)
Eosinophils Relative: 0.9 %
HCT: 47.9 % (ref 38.5–50.0)
Hemoglobin: 16.4 g/dL (ref 13.2–17.1)
Lymphs Abs: 748 cells/uL — ABNORMAL LOW (ref 850–3900)
MCH: 34.2 pg — ABNORMAL HIGH (ref 27.0–33.0)
MCHC: 34.2 g/dL (ref 32.0–36.0)
MCV: 99.8 fL (ref 80.0–100.0)
MPV: 10.9 fL (ref 7.5–12.5)
Monocytes Relative: 8.1 %
Neutro Abs: 3143 cells/uL (ref 1500–7800)
Neutrophils Relative %: 73.1 %
Platelets: 146 10*3/uL (ref 140–400)
RBC: 4.8 10*6/uL (ref 4.20–5.80)
RDW: 12.3 % (ref 11.0–15.0)
Total Lymphocyte: 17.4 %
WBC: 4.3 10*3/uL (ref 3.8–10.8)

## 2020-06-21 LAB — COMPLETE METABOLIC PANEL WITH GFR
AG Ratio: 1.7 (calc) (ref 1.0–2.5)
ALT: 9 U/L (ref 9–46)
AST: 15 U/L (ref 10–35)
Albumin: 3.9 g/dL (ref 3.6–5.1)
Alkaline phosphatase (APISO): 56 U/L (ref 35–144)
BUN: 14 mg/dL (ref 7–25)
CO2: 29 mmol/L (ref 20–32)
Calcium: 8.9 mg/dL (ref 8.6–10.3)
Chloride: 102 mmol/L (ref 98–110)
Creat: 1.1 mg/dL (ref 0.70–1.25)
GFR, Est African American: 80 mL/min/{1.73_m2} (ref >=60)
GFR, Est Non African American: 69 mL/min/{1.73_m2} (ref >=60)
Globulin: 2.3 g/dL (calc) (ref 1.9–3.7)
Glucose, Bld: 93 mg/dL (ref 65–99)
Potassium: 4.3 mmol/L (ref 3.5–5.3)
Sodium: 138 mmol/L (ref 135–146)
Total Bilirubin: 1.2 mg/dL (ref 0.2–1.2)
Total Protein: 6.2 g/dL (ref 6.1–8.1)

## 2020-06-21 LAB — TSH: TSH: 1.42 mIU/L (ref 0.40–4.50)

## 2020-06-21 LAB — LIPID PANEL
Cholesterol: 203 mg/dL — ABNORMAL HIGH (ref <200)
HDL: 54 mg/dL (ref >=40)
LDL Cholesterol (Calc): 130 mg/dL (calc) — ABNORMAL HIGH
Non-HDL Cholesterol (Calc): 149 mg/dL (calc) — ABNORMAL HIGH (ref <130)
Total CHOL/HDL Ratio: 3.8 (calc) (ref <5.0)
Triglycerides: 93 mg/dL (ref <150)

## 2020-06-21 LAB — PSA: PSA: <0.1 ng/mL (ref <=4.0)

## 2020-06-21 LAB — VITAMIN B12: Vitamin B-12: 437 pg/mL (ref 200–1100)

## 2020-08-29 ENCOUNTER — Other Ambulatory Visit: Payer: Self-pay | Admitting: Internal Medicine

## 2020-09-02 ENCOUNTER — Other Ambulatory Visit: Payer: Self-pay | Admitting: Internal Medicine

## 2020-09-26 ENCOUNTER — Other Ambulatory Visit: Payer: Self-pay | Admitting: Internal Medicine

## 2020-10-07 ENCOUNTER — Telehealth: Payer: Self-pay | Admitting: Nurse Practitioner

## 2020-10-07 ENCOUNTER — Other Ambulatory Visit: Payer: Self-pay | Admitting: Nurse Practitioner

## 2020-10-07 MED ORDER — FLECAINIDE ACETATE 100 MG PO TABS: 100.0000 mg | ORAL_TABLET | Freq: Two times a day (BID) | ORAL | 0 refills | Status: DC

## 2020-10-07 MED ORDER — ATENOLOL 50 MG PO TABS: 50.0000 mg | ORAL_TABLET | Freq: Every day | ORAL | 0 refills | Status: DC

## 2020-10-07 NOTE — Telephone Encounter (Signed)
   Pt ran out of his flecainide and atenolol.  These were both filled in Oct 2021 x 30 days and he was advised to call and make an appt.  He thought he had an appt coming up in Dec, but he does not.  I have refilled both meds for 30 days and have advised that he will need to call the office on Monday to make an appt w/ Dr. Rayann Heman, or else we would not be able to refill these medications going forward.  Caller verbalized understanding and was grateful for the call back.  Murray Hodgkins, NP 10/07/2020, 2:09 PM

## 2020-10-10 ENCOUNTER — Telehealth: Payer: Self-pay | Admitting: Internal Medicine

## 2020-10-10 NOTE — Telephone Encounter (Signed)
Error

## 2020-11-08 ENCOUNTER — Other Ambulatory Visit: Payer: Self-pay | Admitting: Internal Medicine

## 2020-11-14 ENCOUNTER — Ambulatory Visit: Payer: 59 | Admitting: Internal Medicine

## 2020-11-14 ENCOUNTER — Telehealth: Payer: Self-pay

## 2020-11-14 NOTE — Telephone Encounter (Signed)
Called pt to inform him that JA will not be able to make it to the office in time for his apt. Pt said he just received a call from [facility] letting him know. He said he will call back later to reschedule the apt and possibly get refill on atenolol. Pt verbalized understanding and thanked me for calling.

## 2020-11-15 ENCOUNTER — Other Ambulatory Visit: Payer: Self-pay

## 2020-11-15 MED ORDER — ATENOLOL 50 MG PO TABS: ORAL_TABLET | ORAL | 0 refills | Status: DC

## 2020-11-15 MED ORDER — LOSARTAN POTASSIUM 25 MG PO TABS
25.0000 mg | ORAL_TABLET | Freq: Every day | ORAL | 0 refills | Status: DC
Start: 2020-11-15 — End: 2020-12-07

## 2020-11-15 NOTE — Telephone Encounter (Signed)
Pt's medication was sent to pt's pharmacy as requested. Confirmation received.  °

## 2020-12-07 ENCOUNTER — Encounter: Payer: Self-pay | Admitting: Internal Medicine

## 2020-12-07 ENCOUNTER — Ambulatory Visit (INDEPENDENT_AMBULATORY_CARE_PROVIDER_SITE_OTHER): Payer: 59 | Admitting: Internal Medicine

## 2020-12-07 ENCOUNTER — Other Ambulatory Visit: Payer: Self-pay

## 2020-12-07 VITALS — BP 136/80 | HR 62 | Ht 68.5 in | Wt 178.0 lb

## 2020-12-07 DIAGNOSIS — I480 Paroxysmal atrial fibrillation: Secondary | ICD-10-CM

## 2020-12-07 DIAGNOSIS — D6869 Other thrombophilia: Secondary | ICD-10-CM

## 2020-12-07 DIAGNOSIS — I10 Essential (primary) hypertension: Secondary | ICD-10-CM | POA: Diagnosis not present

## 2020-12-07 MED ORDER — APIXABAN 5 MG PO TABS
5.0000 mg | ORAL_TABLET | Freq: Two times a day (BID) | ORAL | 3 refills | Status: DC
Start: 2020-12-07 — End: 2021-08-15

## 2020-12-07 MED ORDER — FLECAINIDE ACETATE 100 MG PO TABS
100.0000 mg | ORAL_TABLET | Freq: Two times a day (BID) | ORAL | 3 refills | Status: DC
Start: 2020-12-07 — End: 2020-12-12

## 2020-12-07 MED ORDER — LOSARTAN POTASSIUM 25 MG PO TABS: 25.0000 mg | ORAL_TABLET | Freq: Every day | ORAL | 3 refills | Status: DC

## 2020-12-07 MED ORDER — ATENOLOL 50 MG PO TABS: ORAL_TABLET | ORAL | 3 refills | Status: DC

## 2020-12-07 NOTE — Patient Instructions (Addendum)
Medication Instructions:  Your physician recommends that you continue on your current medications as directed. Please refer to the Current Medication list given to you today.  Labwork: None ordered.  Testing/Procedures: None ordered.  Follow-Up:  Your physician wants you to follow-up in: 12/11/21 at 12:15 pm with Dr. Rayann Heman.    Any Other Special Instructions Will Be Listed Below (If Applicable).  If you need a refill on your cardiac medications before your next appointment, please call your pharmacy.

## 2020-12-07 NOTE — Addendum Note (Signed)
Addended by: Darrell Jewel on: 12/07/2020 03:12 PM   Modules accepted: Orders

## 2020-12-07 NOTE — Progress Notes (Signed)
PCP: Plotnikov, Evie Lacks, MD   Primary EP: Dr Chipper Herb is a 70 y.o. male who presents today for routine electrophysiology followup.  Since last being seen in our clinic, the patient reports doing very well.  Today, he denies symptoms of palpitations, chest pain, shortness of breath,  lower extremity edema, dizziness, presyncope, or syncope.  The patient is otherwise without complaint today.   Past Medical History:  Diagnosis Date  . Depression   . HTN (hypertension)   . Hyperlipidemia   . Paroxysmal A-fib ([place])   . Prostate cancer [facility])    2010 Dr Terance Hart   . [place] spotted fever    age 40  . S/P cardiac cath April 2012   No obstructive disease. Normal LV function  . Scoliosis   . TIA (transient ischemic attack)    2012  . Vitamin B 12 deficiency   . Weight gain    Past Surgical History:  Procedure Laterality Date  . CARDIOVERSION N/A 07/10/2016   Procedure: CARDIOVERSION;  Surgeon: Lelon Perla, MD;  Location: [facility] ENDOSCOPY;  Service: Cardiovascular;  Laterality: N/A;  . CATARACT EXTRACTION W/ INTRAOCULAR LENS  IMPLANT, BILATERAL    . COLONOSCOPY  01-24-2005   TICS ONLY   . HERNIA REPAIR    . [place] and 2003   twice  . NASAL SEPTUM SURGERY    . PROSTATECTOMY  11/2008   Robotic   . TEE WITHOUT CARDIOVERSION N/A 07/10/2016   Procedure: TRANSESOPHAGEAL ECHOCARDIOGRAM (TEE);  Surgeon: Lelon Perla, MD;  Location: [facility] ENDOSCOPY;  Service: Cardiovascular;  Laterality: N/A;    ROS- all systems are reviewed and negatives except as per HPI above  Current Outpatient Medications  Medication Sig Dispense Refill  . ALPRAZolam (XANAX) 0.5 MG tablet TAKE 1 TABLET 2 TIMES DAILYAS NEEDED FOR ANXIETY. 180 tablet 1  . apixaban (ELIQUIS) 5 MG TABS tablet Take 1 tablet (5 mg total) by mouth 2 (two) times daily. 180 tablet 3  . apixaban (ELIQUIS) 5 MG TABS tablet Take 1 tablet (5 mg total) by mouth 2 (two) times daily. 14 tablet 1  . atenolol  (TENORMIN) 50 MG tablet TAKE 1 TABLET(50 MG) BY MOUTH DAILY. Please keep upcoming appt in January 2022 with Dr. Rayann Heman before anymore refills. Thank you 30 tablet 0  . cholecalciferol (VITAMIN D) 1000 UNITS tablet Take 1 tablet (1,000 Units total) by mouth daily. 100 tablet 11  . Cyanocobalamin (NASCOBAL) 500 MCG/0.1ML SOLN USE 1 SPRAY IN 1 NOSTRIL   PER WEEK 12 mL 3  . flecainide (TAMBOCOR) 100 MG tablet Take 1 tablet (100 mg total) by mouth 2 (two) times daily. Please keep upcoming appt for future refills 60 tablet 0  . ibuprofen (ADVIL,MOTRIN) 200 MG tablet Take 200-400 mg by mouth 2 (two) times daily as needed (pain).    Marland Kitchen losartan (COZAAR) 25 MG tablet Take 1 tablet (25 mg total) by mouth daily. Please keep upcoming appt in January 2022 with Dr. Rayann Heman before anymore refills. Thank you 30 tablet 0  . sildenafil (REVATIO) 20 MG tablet Take 20 mg by mouth 3 (three) times daily.     No current facility-administered medications for this visit.    Physical Exam: Vitals:   12/07/20 1434  BP: 136/80  Pulse: 62  SpO2: 95%  Weight: 178 lb (80.7 kg)  Height: 5' 8.5" (1.74 m)    GEN- The patient is well appearing, alert and oriented x 3 today.  Head- normocephalic, atraumatic Eyes-  Sclera clear, conjunctiva pink Ears- hearing intact Oropharynx- clear Lungs- Clear to ausculation bilaterally, normal work of breathing Heart- Regular rate and rhythm, no murmurs, rubs or gallops, PMI not laterally displaced GI- soft, NT, ND, + BS Extremities- no clubbing, cyanosis, or edema  Wt Readings from Last 3 Encounters:  12/07/20 178 lb (80.7 kg)  06/20/20 182 lb (82.6 kg)  12/22/19 181 lb 4 oz (82.2 kg)    EKG tracing ordered today is personally reviewed and shows sinus rhythm  Assessment and Plan:  1. Paroxysmal atrial fibrillation Doing well at this time with flecainide We will need to follow him closely on this medicine to avoid toxicity chads2vasc score is 2.  Continue eliquis Labs  8/21 reviewed  2. HTN Stable No change required today  3. HL We discussed 8/21 labs He is working on lifestyle modification   Risks, benefits and potential toxicities for medications prescribed and/or refilled reviewed with patient today.   Return in a year  Thompson Grayer MD, [facility] 12/07/2020 2:53 PM

## 2020-12-08 ENCOUNTER — Other Ambulatory Visit: Payer: Self-pay | Admitting: Internal Medicine

## 2020-12-12 ENCOUNTER — Other Ambulatory Visit: Payer: Self-pay

## 2020-12-12 MED ORDER — FLECAINIDE ACETATE 100 MG PO TABS: 100.0000 mg | ORAL_TABLET | Freq: Two times a day (BID) | ORAL | 0 refills | Status: DC

## 2020-12-12 NOTE — Telephone Encounter (Signed)
Pt's medication flecainide, sent to local pharmacy for a short supply, 15 days, until mail order arrives. Confirmation received.

## 2020-12-19 ENCOUNTER — Other Ambulatory Visit: Payer: Self-pay

## 2020-12-20 ENCOUNTER — Encounter: Payer: Self-pay | Admitting: Internal Medicine

## 2020-12-20 ENCOUNTER — Ambulatory Visit (INDEPENDENT_AMBULATORY_CARE_PROVIDER_SITE_OTHER): Payer: 59 | Admitting: Internal Medicine

## 2020-12-20 VITALS — BP 140/82 | HR 61 | Temp 97.8°F | Wt 181.8 lb

## 2020-12-20 DIAGNOSIS — E538 Deficiency of other specified B group vitamins: Secondary | ICD-10-CM

## 2020-12-20 DIAGNOSIS — Z0000 Encounter for general adult medical examination without abnormal findings: Secondary | ICD-10-CM

## 2020-12-20 DIAGNOSIS — E559 Vitamin D deficiency, unspecified: Secondary | ICD-10-CM

## 2020-12-20 DIAGNOSIS — I10 Essential (primary) hypertension: Secondary | ICD-10-CM | POA: Diagnosis not present

## 2020-12-20 DIAGNOSIS — I480 Paroxysmal atrial fibrillation: Secondary | ICD-10-CM | POA: Diagnosis not present

## 2020-12-20 MED ORDER — SILDENAFIL CITRATE 100 MG PO TABS: 100.0000 mg | ORAL_TABLET | Freq: Every day | ORAL | 5 refills | Status: AC | PRN

## 2020-12-20 MED ORDER — VITAMIN D3 50 MCG (2000 UT) PO CAPS: 2000.0000 [IU] | ORAL_CAPSULE | Freq: Every day | ORAL | 3 refills | Status: AC

## 2020-12-20 NOTE — Assessment & Plan Note (Addendum)
On B12 SL

## 2020-12-20 NOTE — Progress Notes (Signed)
Subjective:  Patient ID: Evan Farmer, male    DOB: October 14, 1951  Age: 70 y.o. MRN: [id number]  CC: Follow-up (6 month f/u)   HPI Evan Farmer presents for HTN, A fib, anxiety f/u  Outpatient Medications Prior to Visit  Medication Sig Dispense Refill  . ALPRAZolam (XANAX) 0.5 MG tablet TAKE 1 TABLET 2 TIMES DAILYAS NEEDED FOR ANXIETY. 180 tablet 1  . apixaban (ELIQUIS) 5 MG TABS tablet Take 1 tablet (5 mg total) by mouth 2 (two) times daily. 180 tablet 3  . atenolol (TENORMIN) 50 MG tablet TAKE 1 TABLET(50 MG) BY MOUTH DAILY. 90 tablet 3  . cholecalciferol (VITAMIN D) 1000 UNITS tablet Take 1 tablet (1,000 Units total) by mouth daily. 100 tablet 11  . flecainide (TAMBOCOR) 100 MG tablet Take 1 tablet (100 mg total) by mouth 2 (two) times daily. 30 tablet 0  . ibuprofen (ADVIL,MOTRIN) 200 MG tablet Take 200-400 mg by mouth 2 (two) times daily as needed (pain).    Marland Kitchen losartan (COZAAR) 25 MG tablet Take 1 tablet (25 mg total) by mouth daily. 90 tablet 3  . sildenafil (REVATIO) 20 MG tablet Take 20 mg by mouth daily as needed.    . Cyanocobalamin (NASCOBAL) 500 MCG/0.1ML SOLN USE 1 SPRAY IN 1 NOSTRIL   PER WEEK (Patient not taking: Reported on 12/20/2020) 12 mL 3   No facility-administered medications prior to visit.    ROS: Review of Systems  Constitutional: Negative for appetite change, fatigue and unexpected weight change.  HENT: Negative for congestion, nosebleeds, sneezing, sore throat and trouble swallowing.   Eyes: Negative for itching and visual disturbance.  Respiratory: Negative for cough.   Cardiovascular: Negative for chest pain, palpitations and leg swelling.  Gastrointestinal: Negative for abdominal distention, blood in stool, diarrhea and nausea.  Genitourinary: Negative for frequency and hematuria.  Musculoskeletal: Positive for gait problem. Negative for back pain, joint swelling and neck pain.  Skin: Negative for rash.  Neurological: Negative for dizziness, tremors,  speech difficulty and weakness.  Psychiatric/Behavioral: Negative for agitation, dysphoric mood, sleep disturbance and suicidal ideas. The patient is not nervous/anxious.     Objective:  BP 140/82 (BP Location: Left Arm)   Pulse 61   Temp 97.8 F (36.6 C) (Oral)   Wt 181 lb 12.8 oz (82.5 kg)   SpO2 98%   BMI 27.24 kg/m   BP Readings from Last 3 Encounters:  12/20/20 140/82  12/07/20 136/80  06/20/20 130/90    Wt Readings from Last 3 Encounters:  12/20/20 181 lb 12.8 oz (82.5 kg)  12/07/20 178 lb (80.7 kg)  06/20/20 182 lb (82.6 kg)    Physical Exam Constitutional:      General: He is not in acute distress.    Appearance: He is well-developed and well-nourished. He is not diaphoretic.     Comments: NAD  HENT:     Head: Normocephalic and atraumatic.     Right Ear: External ear normal.     Left Ear: External ear normal.     Nose: Nose normal.     Mouth/Throat:     Mouth: Oropharynx is clear and moist.     Pharynx: No oropharyngeal exudate.  Eyes:     General: No scleral icterus.       Right eye: No discharge.        Left eye: No discharge.     Extraocular Movements: EOM normal.     Conjunctiva/sclera: Conjunctivae normal.     Pupils: Pupils are  equal, round, and reactive to light.  Neck:     Thyroid: No thyromegaly.     Vascular: No JVD.     Trachea: No tracheal deviation.  Cardiovascular:     Rate and Rhythm: Normal rate and regular rhythm.     Pulses: Intact distal pulses.     Heart sounds: Normal heart sounds. No murmur heard. No friction rub. No gallop.   Pulmonary:     Effort: Pulmonary effort is normal. No respiratory distress.     Breath sounds: Normal breath sounds. No stridor. No wheezing or rales.  Chest:     Chest wall: No tenderness.  Abdominal:     General: Bowel sounds are normal. There is no distension.     Palpations: Abdomen is soft. There is no mass.     Tenderness: There is no abdominal tenderness. There is no guarding or rebound.   Genitourinary:    Penis: Normal. No tenderness.      Prostate: Normal.     Rectum: Normal. Guaiac result negative.  Musculoskeletal:        General: No tenderness or edema. Normal range of motion.     Cervical back: Normal range of motion and neck supple.  Lymphadenopathy:     Cervical: No cervical adenopathy.  Skin:    General: Skin is warm and dry.     Coloration: Skin is not pale.     Findings: No erythema or rash.  Neurological:     Mental Status: He is alert and oriented to person, place, and time.     Cranial Nerves: No cranial nerve deficit.     Motor: No abnormal muscle tone.     Coordination: He displays a negative Romberg sign. Coordination normal.     Gait: Gait normal.     Deep Tendon Reflexes: Reflexes are normal and symmetric. Reflexes normal.  Psychiatric:        Mood and Affect: Mood and affect normal.        Behavior: Behavior normal.        Thought Content: Thought content normal.        Judgment: Judgment normal.     Lab Results  Component Value Date   WBC 4.3 06/20/2020   HGB 16.4 06/20/2020   HCT 47.9 06/20/2020   PLT 146 06/20/2020   GLUCOSE 93 06/20/2020   CHOL 203 (H) 06/20/2020   TRIG 93 06/20/2020   HDL 54 06/20/2020   LDLCALC 130 (H) 06/20/2020   ALT 9 06/20/2020   AST 15 06/20/2020   NA 138 06/20/2020   K 4.3 06/20/2020   CL 102 06/20/2020   CREATININE 1.10 06/20/2020   BUN 14 06/20/2020   CO2 29 06/20/2020   TSH 1.42 06/20/2020   PSA <0.1 06/20/2020   INR 0.95 02/12/2011   HGBA1C  01/20/2011    5.2 (NOTE)                                                                       According to the ADA Clinical Practice Recommendations for 2011, when HbA1c is used as a screening test:   >=6.5%   Diagnostic of Diabetes Mellitus           (if abnormal result  is  confirmed)  5.7-6.4%   Increased risk of developing Diabetes Mellitus  References:Diagnosis and Classification of Diabetes Mellitus,Diabetes VZSM,2707,86(LJQGB 1):S62-S69 and Standards  of Medical Care in         Diabetes - 2011,Diabetes EEFE,0712,19  (Suppl 1):S11-S61.    CT CARDIAC SCORING  Addendum Date: 02/19/2020   ADDENDUM REPORT: 02/19/2020 16:44 CLINICAL DATA:  53M for with hypertension and atrial fibrillation for risk stratification EXAM: Coronary Calcium Score TECHNIQUE: The patient was scanned on a [employer]. Axial non-contrast 3 mm slices were carried out through the heart. The data set was analyzed on a dedicated work station and scored using the [place]. FINDINGS: Non-cardiac: See separate report from [facility]. Ascending Aorta: Ascending aorta mildly dilated. 3.9 cm. Minimal calcification at the aortic root and the descending aorta. Pericardium: Normal Coronary arteries: Normal coronary anatomy and origin. Minimal calcification noted in the proximal LAD. IMPRESSION: Coronary calcium score of 0.825. This was 20th percentile for age and sex matched control. Minimal calcification noted in the proximal LAD. Minimal calcification in the aortic root and descending aorta. Ascending aorta mildly dilated. Skeet Latch, MD Electronically Signed   By: Skeet Latch   On: 02/19/2020 16:44   Result Date: 02/19/2020 EXAM: OVER-READ INTERPRETATION  CT CHEST The following report is an over-read performed by radiologist Dr. Abigail Miyamoto of [facility], [place] on 02/19/2020. This over-read does not include interpretation of cardiac or coronary anatomy or pathology. The calcium score interpretation by the cardiologist is attached. COMPARISON:  Chest radiograph 02/11/2011. FINDINGS: Vascular: Markedly tortuous thoracic aorta.  Aortic atherosclerosis. Mediastinum/Nodes: No imaged thoracic adenopathy. Mildly dilated esophagus with fluid level within on 15/4. Lungs/Pleura: Trace right greater than left pleural effusions. Left greater than right base scarring. Upper Abdomen: Low-density high left liver lobe lesions are suboptimally evaluated but likely cysts.  Maximally 2.0 cm. Normal imaged portions of the stomach, spleen, adrenal glands, kidneys. Musculoskeletal: Marked convex right thoracic spine curvature IMPRESSION: 1.  No acute findings in the imaged extracardiac chest. 2. Trace bilateral pleural effusions. 3. Esophageal air fluid level suggests dysmotility or gastroesophageal reflux. 4. Aortic Atherosclerosis (ICD10-I70.0). Electronically Signed: By: Abigail Miyamoto M.D. On: 02/19/2020 12:12    Assessment & Plan:   Walker Kehr, MD

## 2020-12-20 NOTE — Assessment & Plan Note (Signed)
On Atenolol, Losartan 

## 2020-12-20 NOTE — Assessment & Plan Note (Signed)
Vit D 

## 2020-12-20 NOTE — Assessment & Plan Note (Signed)
On Atenolol, Flecanide, Eliquis

## 2020-12-27 ENCOUNTER — Telehealth: Payer: Self-pay | Admitting: Internal Medicine

## 2020-12-27 NOTE — Telephone Encounter (Signed)
C/O Elevated BP. 204/194 before morning medication. 170/82 after morning medication. Took extra 1/2 tablet of Losartan. Current blood pressure this afternoon 127/72, heart rate 54.   Patient states he missed his Losartan yesterday and had salty pizza. He feels like this was the cause of his blood pressure going up along with being very stressed this morning.  Advised patient to adhere to Losartan as prescribed without missed doses if possible, also avoid salty foods and reduce stress when able. Will forward to Dr. Rayann Heman for any further recommendations.

## 2020-12-27 NOTE — Telephone Encounter (Signed)
Pt c/o BP issue: STAT if pt c/o blurred vision, one-sided weakness or slurred speech  1. What are your last 5 BP readings?  204/194 170/82  2. Are you having any other symptoms (ex. Dizziness, headache, blurred vision, passed out)? No   3. What is your BP issue? Patient's BP has been elevated. He states he look an extra Losartan and it went down, but now it's going up again. He would like to know if he needs to increase his Losartan. He states he normally only takes 1 25 MG tablet per day.

## 2021-01-02 NOTE — Telephone Encounter (Signed)
Per Allred. Okay, no need for changes at this time.

## 2021-04-21 ENCOUNTER — Telehealth: Payer: Self-pay | Admitting: Internal Medicine

## 2021-04-21 NOTE — Telephone Encounter (Signed)
    Patient requesting refill ALPRAZolam Evan Farmer) 0.5 MG tablet  Pharmacy [facility] [place], [place] AT Portal to Registered [employer] Sites

## 2021-04-24 MED ORDER — ALPRAZOLAM 0.5 MG PO TABS: 0.5000 mg | ORAL_TABLET | Freq: Two times a day (BID) | ORAL | 1 refills | Status: DC | PRN

## 2021-04-24 NOTE — Telephone Encounter (Signed)
Check [place] registry last filled 09/02/2020.Marland KitchenJohny Farmer

## 2021-04-24 NOTE — Telephone Encounter (Signed)
Okay.  Done.  Thanks 

## 2021-06-13 ENCOUNTER — Telehealth: Payer: Self-pay | Admitting: Internal Medicine

## 2021-06-13 NOTE — Telephone Encounter (Signed)
LVM for pt to rtn my call to schedule AWV with [facility]. Please schedule this appt if pt calls the office.  °

## 2021-06-20 ENCOUNTER — Other Ambulatory Visit: Payer: Self-pay

## 2021-06-20 ENCOUNTER — Ambulatory Visit (INDEPENDENT_AMBULATORY_CARE_PROVIDER_SITE_OTHER): Payer: 59 | Admitting: Internal Medicine

## 2021-06-20 ENCOUNTER — Encounter: Payer: Self-pay | Admitting: Internal Medicine

## 2021-06-20 ENCOUNTER — Ambulatory Visit: Payer: 59

## 2021-06-20 DIAGNOSIS — I10 Essential (primary) hypertension: Secondary | ICD-10-CM | POA: Diagnosis not present

## 2021-06-20 DIAGNOSIS — I480 Paroxysmal atrial fibrillation: Secondary | ICD-10-CM | POA: Diagnosis not present

## 2021-06-20 DIAGNOSIS — E559 Vitamin D deficiency, unspecified: Secondary | ICD-10-CM | POA: Diagnosis not present

## 2021-06-20 DIAGNOSIS — E538 Deficiency of other specified B group vitamins: Secondary | ICD-10-CM

## 2021-06-20 DIAGNOSIS — Z0000 Encounter for general adult medical examination without abnormal findings: Secondary | ICD-10-CM | POA: Diagnosis not present

## 2021-06-20 LAB — CBC WITH DIFFERENTIAL/PLATELET
Basophils Absolute: 0 10*3/uL (ref 0.0–0.1)
Basophils Relative: 0.7 % (ref 0.0–3.0)
Eosinophils Absolute: 0 10*3/uL (ref 0.0–0.7)
Eosinophils Relative: 0.7 % (ref 0.0–5.0)
HCT: 47.6 % (ref 39.0–52.0)
Hemoglobin: 16 g/dL (ref 13.0–17.0)
Lymphocytes Relative: 15.6 % (ref 12.0–46.0)
Lymphs Abs: 0.6 10*3/uL — ABNORMAL LOW (ref 0.7–4.0)
MCHC: 33.7 g/dL (ref 30.0–36.0)
MCV: 99.7 fl (ref 78.0–100.0)
Monocytes Absolute: 0.3 10*3/uL (ref 0.1–1.0)
Monocytes Relative: 7.4 % (ref 3.0–12.0)
Neutro Abs: 2.8 10*3/uL (ref 1.4–7.7)
Neutrophils Relative %: 75.6 % (ref 43.0–77.0)
Platelets: 136 10*3/uL — ABNORMAL LOW (ref 150.0–400.0)
RBC: 4.77 Mil/uL (ref 4.22–5.81)
RDW: 13.2 % (ref 11.5–15.5)
WBC: 3.7 10*3/uL — ABNORMAL LOW (ref 4.0–10.5)

## 2021-06-20 LAB — URINALYSIS, ROUTINE W REFLEX MICROSCOPIC
Bilirubin Urine: NEGATIVE
Ketones, ur: NEGATIVE
Leukocytes,Ua: NEGATIVE
Nitrite: NEGATIVE
Specific Gravity, Urine: 1.025 (ref 1.000–1.030)
Total Protein, Urine: NEGATIVE
Urine Glucose: NEGATIVE
Urobilinogen, UA: 0.2 (ref 0.0–1.0)
WBC, UA: NONE SEEN (ref 0–?)
pH: 5.5 (ref 5.0–8.0)

## 2021-06-20 LAB — LIPID PANEL
Cholesterol: 203 mg/dL — ABNORMAL HIGH (ref 0–200)
HDL: 52.1 mg/dL (ref >39.00)
LDL Cholesterol: 134 mg/dL — ABNORMAL HIGH (ref 0–99)
NonHDL: 150.85
Total CHOL/HDL Ratio: 4
Triglycerides: 84 mg/dL (ref 0.0–149.0)
VLDL: 16.8 mg/dL (ref 0.0–40.0)

## 2021-06-20 LAB — COMPREHENSIVE METABOLIC PANEL
ALT: 11 U/L (ref 0–53)
AST: 16 U/L (ref 0–37)
Albumin: 4 g/dL (ref 3.5–5.2)
Alkaline Phosphatase: 54 U/L (ref 39–117)
BUN: 20 mg/dL (ref 6–23)
CO2: 29 mEq/L (ref 19–32)
Calcium: 8.9 mg/dL (ref 8.4–10.5)
Chloride: 100 mEq/L (ref 96–112)
Creatinine, Ser: 1.14 mg/dL (ref 0.40–1.50)
GFR: 66.08 mL/min (ref >60.00)
Glucose, Bld: 93 mg/dL (ref 70–99)
Potassium: 4.3 mEq/L (ref 3.5–5.1)
Sodium: 135 mEq/L (ref 135–145)
Total Bilirubin: 1.2 mg/dL (ref 0.2–1.2)
Total Protein: 6.7 g/dL (ref 6.0–8.3)

## 2021-06-20 LAB — TSH: TSH: 0.71 u[IU]/mL (ref 0.35–5.50)

## 2021-06-20 LAB — VITAMIN B12: Vitamin B-12: >1550 pg/mL — ABNORMAL HIGH (ref 211–911)

## 2021-06-20 NOTE — Progress Notes (Signed)
Subjective:  Patient ID: Evan Farmer, male    DOB: 06-29-1951  Age: 70 y.o. MRN: [id number]  CC: Follow-up (6 month f/u)   HPI Evan Farmer presents for A fib, anxiety, HTN, B12 def  Outpatient Medications Prior to Visit  Medication Sig Dispense Refill   ALPRAZolam (XANAX) 0.5 MG tablet Take 1 tablet (0.5 mg total) by mouth 2 (two) times daily as needed for anxiety. 180 tablet 1   apixaban (ELIQUIS) 5 MG TABS tablet Take 1 tablet (5 mg total) by mouth 2 (two) times daily. 180 tablet 3   atenolol (TENORMIN) 50 MG tablet TAKE 1 TABLET(50 MG) BY MOUTH DAILY. 90 tablet 3   Cholecalciferol (VITAMIN D3) 50 MCG (2000 UT) capsule Take 1 capsule (2,000 Units total) by mouth daily. 100 capsule 3   flecainide (TAMBOCOR) 100 MG tablet Take 1 tablet (100 mg total) by mouth 2 (two) times daily. 30 tablet 0   ibuprofen (ADVIL,MOTRIN) 200 MG tablet Take 200-400 mg by mouth 2 (two) times daily as needed (pain).     losartan (COZAAR) 25 MG tablet Take 1 tablet (25 mg total) by mouth daily. 90 tablet 3   sildenafil (VIAGRA) 100 MG tablet Take 1 tablet (100 mg total) by mouth daily as needed for erectile dysfunction. 12 tablet 5   Cyanocobalamin (NASCOBAL) 500 MCG/0.1ML SOLN USE 1 SPRAY IN 1 NOSTRIL   PER WEEK (Patient not taking: No sig reported) 12 mL 3   No facility-administered medications prior to visit.    ROS: Review of Systems  Constitutional:  Negative for appetite change, fatigue and unexpected weight change.  HENT:  Negative for congestion, nosebleeds, sneezing, sore throat and trouble swallowing.   Eyes:  Negative for itching and visual disturbance.  Respiratory:  Negative for cough.   Cardiovascular:  Negative for chest pain, palpitations and leg swelling.  Gastrointestinal:  Negative for abdominal distention, blood in stool, diarrhea and nausea.  Genitourinary:  Negative for frequency and hematuria.  Musculoskeletal:  Negative for back pain, gait problem, joint swelling and neck  pain.  Skin:  Negative for rash.  Neurological:  Negative for dizziness, tremors, speech difficulty and weakness.  Psychiatric/Behavioral:  Negative for agitation, dysphoric mood and sleep disturbance. The patient is not nervous/anxious.    Objective:  BP 140/68 (BP Location: Left Arm)   Pulse 62   Temp 98.1 F (36.7 C) (Oral)   Ht 5' 8.5" (1.74 m)   Wt 174 lb 6.4 oz (79.1 kg)   SpO2 96%   BMI 26.13 kg/m   BP Readings from Last 3 Encounters:  06/20/21 140/68  12/20/20 140/82  12/07/20 136/80    Wt Readings from Last 3 Encounters:  06/20/21 174 lb 6.4 oz (79.1 kg)  12/20/20 181 lb 12.8 oz (82.5 kg)  12/07/20 178 lb (80.7 kg)    Physical Exam Constitutional:      General: He is not in acute distress.    Appearance: He is well-developed.     Comments: NAD  Eyes:     Conjunctiva/sclera: Conjunctivae normal.     Pupils: Pupils are equal, round, and reactive to light.  Neck:     Thyroid: No thyromegaly.     Vascular: No JVD.  Cardiovascular:     Rate and Rhythm: Normal rate and regular rhythm.     Heart sounds: Normal heart sounds. No murmur heard.   No friction rub. No gallop.  Pulmonary:     Effort: Pulmonary effort is normal. No respiratory distress.  Breath sounds: Normal breath sounds. No wheezing or rales.  Chest:     Chest wall: No tenderness.  Abdominal:     General: Bowel sounds are normal. There is no distension.     Palpations: Abdomen is soft. There is no mass.     Tenderness: There is no abdominal tenderness. There is no guarding or rebound.  Musculoskeletal:        General: No tenderness. Normal range of motion.     Cervical back: Normal range of motion.  Lymphadenopathy:     Cervical: No cervical adenopathy.  Skin:    General: Skin is warm and dry.     Findings: No rash.  Neurological:     Mental Status: He is alert and oriented to person, place, and time.     Cranial Nerves: No cranial nerve deficit.     Motor: No abnormal muscle tone.      Coordination: Coordination normal.     Gait: Gait normal.     Deep Tendon Reflexes: Reflexes are normal and symmetric.  Psychiatric:        Behavior: Behavior normal.        Thought Content: Thought content normal.        Judgment: Judgment normal.    Lab Results  Component Value Date   WBC 4.3 06/20/2020   HGB 16.4 06/20/2020   HCT 47.9 06/20/2020   PLT 146 06/20/2020   GLUCOSE 93 06/20/2020   CHOL 203 (H) 06/20/2020   TRIG 93 06/20/2020   HDL 54 06/20/2020   LDLCALC 130 (H) 06/20/2020   ALT 9 06/20/2020   AST 15 06/20/2020   NA 138 06/20/2020   K 4.3 06/20/2020   CL 102 06/20/2020   CREATININE 1.10 06/20/2020   BUN 14 06/20/2020   CO2 29 06/20/2020   TSH 1.42 06/20/2020   PSA <0.1 06/20/2020   INR 0.95 02/12/2011   HGBA1C  01/20/2011    5.2 (NOTE)                                                                       According to the ADA Clinical Practice Recommendations for 2011, when HbA1c is used as a screening test:   >=6.5%   Diagnostic of Diabetes Mellitus           (if abnormal result  is confirmed)  5.7-6.4%   Increased risk of developing Diabetes Mellitus  References:Diagnosis and Classification of Diabetes Mellitus,Diabetes [id number] 1):S62-S69 and Standards of Medical Care in         Diabetes - 2011,Diabetes Care,2011,34  (Suppl 1):S11-S61.    CT CARDIAC SCORING  Addendum Date: 02/19/2020   ADDENDUM REPORT: 02/19/2020 16:44 CLINICAL DATA:  63M for with hypertension and atrial fibrillation for risk stratification EXAM: Coronary Calcium Score TECHNIQUE: The patient was scanned on a [employer]. Axial non-contrast 3 mm slices were carried out through the heart. The data set was analyzed on a dedicated work station and scored using the [place]. FINDINGS: Non-cardiac: See separate report from [facility]. Ascending Aorta: Ascending aorta mildly dilated. 3.9 cm. Minimal calcification at the aortic root and the descending aorta.  Pericardium: Normal Coronary arteries: Normal coronary anatomy and origin. Minimal calcification noted in the proximal LAD.  IMPRESSION: Coronary calcium score of 0.825. This was 20th percentile for age and sex matched control. Minimal calcification noted in the proximal LAD. Minimal calcification in the aortic root and descending aorta. Ascending aorta mildly dilated. Skeet Latch, MD Electronically Signed   By: Skeet Latch   On: 02/19/2020 16:44   Result Date: 02/19/2020 EXAM: OVER-READ INTERPRETATION  CT CHEST The following report is an over-read performed by radiologist Dr. Abigail Miyamoto of [facility], [place] on 02/19/2020. This over-read does not include interpretation of cardiac or coronary anatomy or pathology. The calcium score interpretation by the cardiologist is attached. COMPARISON:  Chest radiograph 02/11/2011. FINDINGS: Vascular: Markedly tortuous thoracic aorta.  Aortic atherosclerosis. Mediastinum/Nodes: No imaged thoracic adenopathy. Mildly dilated esophagus with fluid level within on 15/4. Lungs/Pleura: Trace right greater than left pleural effusions. Left greater than right base scarring. Upper Abdomen: Low-density high left liver lobe lesions are suboptimally evaluated but likely cysts. Maximally 2.0 cm. Normal imaged portions of the stomach, spleen, adrenal glands, kidneys. Musculoskeletal: Marked convex right thoracic spine curvature IMPRESSION: 1.  No acute findings in the imaged extracardiac chest. 2. Trace bilateral pleural effusions. 3. Esophageal air fluid level suggests dysmotility or gastroesophageal reflux. 4. Aortic Atherosclerosis (ICD10-I70.0). Electronically Signed: By: Abigail Miyamoto M.D. On: 02/19/2020 12:12    Assessment & Plan:     Walker Kehr, MD

## 2021-06-20 NOTE — Addendum Note (Signed)
Addended by: Jacobo Forest on: 06/20/2021 11:09 AM   Modules accepted: Orders

## 2021-06-20 NOTE — Assessment & Plan Note (Signed)
On SL B12 

## 2021-06-20 NOTE — Assessment & Plan Note (Signed)
Cont w/Atenolol, Losartan 

## 2021-06-20 NOTE — Assessment & Plan Note (Signed)
On Vit D 

## 2021-06-20 NOTE — Assessment & Plan Note (Signed)
Cont w/Eliquis, Flecainide  Check TSH, CMET

## 2021-06-30 ENCOUNTER — Ambulatory Visit: Payer: 59

## 2021-07-07 ENCOUNTER — Ambulatory Visit (INDEPENDENT_AMBULATORY_CARE_PROVIDER_SITE_OTHER): Payer: 59

## 2021-07-07 ENCOUNTER — Other Ambulatory Visit: Payer: Self-pay

## 2021-07-07 VITALS — BP 122/78 | HR 61 | Temp 98.8°F | Resp 16 | Ht 69.0 in | Wt 173.4 lb

## 2021-07-07 DIAGNOSIS — Z1211 Encounter for screening for malignant neoplasm of colon: Secondary | ICD-10-CM

## 2021-07-07 DIAGNOSIS — Z0000 Encounter for general adult medical examination without abnormal findings: Secondary | ICD-10-CM | POA: Diagnosis not present

## 2021-07-07 NOTE — Progress Notes (Addendum)
Subjective:   Evan Farmer is a 70 y.o. male who presents for Medicare Annual/Subsequent preventive examination.  Review of Systems     Cardiac Risk Factors include: advanced age (>23mn, >>65women);dyslipidemia;family history of premature cardiovascular disease;hypertension;male gender     Objective:    Today's Vitals   07/07/21 1308  BP: 122/78  Pulse: 61  Resp: 16  Temp: 98.8 F (37.1 C)  SpO2: 94%  Weight: 173 lb 6.4 oz (78.7 kg)  Height: '5\' 9"'$  (1.753 m)  PainSc: 2    Body mass index is 25.61 kg/m.  Advanced Directives 07/07/2021 07/10/2016 01/30/2015 11/08/2014  Does Patient Have a Medical Advance Directive? No No No No  Would patient like information on creating a medical advance directive? No - Patient declined No - patient declined information - -    Current Medications (verified) Outpatient Encounter Medications as of 07/07/2021  Medication Sig   ALPRAZolam (XANAX) 0.5 MG tablet Take 1 tablet (0.5 mg total) by mouth 2 (two) times daily as needed for anxiety.   apixaban (ELIQUIS) 5 MG TABS tablet Take 1 tablet (5 mg total) by mouth 2 (two) times daily.   atenolol (TENORMIN) 50 MG tablet TAKE 1 TABLET(50 MG) BY MOUTH DAILY.   Cholecalciferol (VITAMIN D3) 50 MCG (2000 UT) capsule Take 1 capsule (2,000 Units total) by mouth daily.   flecainide (TAMBOCOR) 100 MG tablet Take 1 tablet (100 mg total) by mouth 2 (two) times daily.   ibuprofen (ADVIL,MOTRIN) 200 MG tablet Take 200-400 mg by mouth 2 (two) times daily as needed (pain).   losartan (COZAAR) 25 MG tablet Take 1 tablet (25 mg total) by mouth daily.   sildenafil (VIAGRA) 100 MG tablet Take 1 tablet (100 mg total) by mouth daily as needed for erectile dysfunction.   No facility-administered encounter medications on file as of 07/07/2021.    Allergies (verified) Procainamide hcl and Quinidine   History: Past Medical History:  Diagnosis Date   Depression    HTN (hypertension)    Hyperlipidemia     Paroxysmal A-fib (HCC)    Prostate [facility]    [facility]    age 70  S/P cardiac cath April 2012   No obstructive disease. Normal LV function   Scoliosis    TIA (transient ischemic attack)    2012   Vitamin B 12 deficiency    Weight gain    Past Surgical History:  Procedure Laterality Date   CARDIOVERSION N/A 07/10/2016   Procedure: CARDIOVERSION;  Surgeon: BLelon Perla MD;  [facility];  Service: Cardiovascular;  Laterality: N/A;   CATARACT EXTRACTION W/ INTRAOCULAR LENS  IMPLANT, BILATERAL     COLONOSCOPY  01-24-2005   TICS ONLY    HERNIA REPAIR     HERNIA REPAIR  1995 and 2003   twice   NASAL SEPTUM SURGERY     PROSTATECTOMY  11/2008   Robotic    TEE WITHOUT CARDIOVERSION N/A 07/10/2016   Procedure: TRANSESOPHAGEAL ECHOCARDIOGRAM (TEE);  Surgeon: BLelon Perla MD;  [facility];  Service: Cardiovascular;  Laterality: N/A;   Family History  Problem Relation Age of Onset   Arrhythmia Mother    Atrial fibrillation Mother    Cancer Father    Lung cancer Other    Prostate cancer Other    Colon cancer Paternal Uncle    Rectal cancer Neg Hx    Stomach cancer Neg Hx    Social  History   Socioeconomic History   Marital status: Divorced    Spouse name: Not on file   Number of children: Not on file   Years of education: Not on file   Highest education level: Not on file  Occupational History   Not on file  Tobacco Use   Smoking status: Never   Smokeless tobacco: Never  Vaping Use   Vaping Use: Never used  Substance and Sexual Activity   Alcohol use: Yes    Alcohol/week: 8.0 standard drinks    Types: 4 Glasses of wine, 4 Shots of liquor per week    Comment: Social alcohol use   Drug use: No   Sexual activity: Not on file  Other Topics Concern   Not on file  Social History Narrative   Regular Exercise -  YES   Social Determinants of Health   Financial Resource Strain: Low Risk    Difficulty  of Paying Living Expenses: Not hard at all  Food Insecurity: No Food Insecurity   Worried About Charity fundraiser in the Last Year: Never true   Ran Out of Food in the Last Year: Never true  Transportation Needs: No Transportation Needs   Lack of Transportation (Medical): No   Lack of Transportation (Non-Medical): No  Physical Activity: Sufficiently Active   Days of Exercise per Week: 5 days   Minutes of Exercise per Session: 30 min  Stress: No Stress Concern Present   Feeling of Stress : Not at all  Social Connections: Socially Integrated   Frequency of Communication with Friends and Family: More than three times a week   Frequency of Social Gatherings with Friends and Family: More than three times a week   Attends Religious Services: More than 4 times per year   Active Member of [employer] or Organizations: Yes   Attends Music therapist: More than 4 times per year   Marital Status: Living with partner    Tobacco Counseling Counseling given: Not Answered   Clinical Intake:  Pre-visit preparation completed: Yes  Pain : No/denies pain Pain Score: 2      BMI - recorded: 25.61 Nutritional Status: BMI 25 -29 Overweight Nutritional Risks: None Diabetes: No  How often do you need to have someone help you when you read instructions, pamphlets, or other written materials from your doctor or pharmacy?: 1 - Never What is the last grade level you completed in school?: B.S. Degree from [employer]  Diabetic? no  Interpreter Needed?: No  Information entered by :: Lisette Abu, LPN   Activities of Daily Living In your present state of health, do you have any difficulty performing the following activities: 07/07/2021  Hearing? N  Vision? N  Difficulty concentrating or making decisions? N  Walking or climbing stairs? N  Dressing or bathing? N  Doing errands, shopping? N  Preparing Food and eating ? N  Using the Toilet? N  In the past six months, have you accidently  leaked urine? N  Do you have problems with loss of bowel control? N  Managing your Medications? N  Managing your Finances? N  Housekeeping or managing your Housekeeping? N  Some recent data might be hidden    Patient Care Team: Plotnikov, Evie Lacks, MD as PCP - General Raynelle Bring, MD as Consulting Physician (Urology) Thompson Grayer, MD as Consulting Physician (Cardiology) Pyrtle, Lajuan Lines, MD as Consulting Physician (Gastroenterology) Calvert Cantor, MD as Consulting Physician (Ophthalmology)  Indicate any recent Medical Services you may have  received from other than [facility] providers in the past year (date may be approximate).     Assessment:   This is a routine wellness examination for [place].  Hearing/Vision screen Hearing Screening - Comments:: Patient declined any hearing difficulty. Vision Screening - Comments:: Patient wears eye glasses.  Eye exam done by Dr. Calvert Cantor.  Dietary issues and exercise activities discussed: Current Exercise Habits: Home exercise routine, Type of exercise: walking;Other - see comments (Patient still works part-time; very active at work.), Time (Minutes): 30, Frequency (Times/Week): 5, Weekly Exercise (Minutes/Week): 150, Intensity: Moderate, Exercise limited by: None identified   Goals Addressed               This Visit's Progress     Patient Stated (pt-stated)        My goal is to continue to lose weight by eating healthier, staying hydrated and being physically active. My weight goal is 165 pounds.       Depression Screen PHQ 2/9 Scores 07/07/2021 03/25/2018  PHQ - 2 Score 0 0    Fall Risk Fall Risk  07/07/2021 06/20/2021 12/22/2019 03/25/2018  Falls in the past year? 0 1 0 No  Number falls in past yr: 0 0 0 -  Injury with Fall? 0 0 0 -  Risk for fall due to : No Fall Risks No Fall Risks - -  Follow up Falls evaluation completed - - -    FALL RISK PREVENTION PERTAINING TO THE HOME:  Any stairs in or around the home? No  If so, are  there any without handrails? No  Home free of loose throw rugs in walkways, pet beds, electrical cords, etc? Yes  Adequate lighting in your home to reduce risk of falls? Yes   ASSISTIVE DEVICES UTILIZED TO PREVENT FALLS:  Life alert? No  Use of a cane, walker or w/c? No  Grab bars in the bathroom? No  Shower chair or bench in shower? No  Elevated toilet seat or a handicapped toilet? No   TIMED UP AND GO:  Was the test performed? Yes .  Length of time to ambulate 10 feet: 5 sec.   Gait steady and fast without use of assistive device  Cognitive Function: Normal cognitive status assessed by direct observation by this Nurse Health Advisor. No abnormalities found.          Immunizations Immunization History  Administered Date(s) Administered   Fluad Quad(high Dose 65+) 08/12/2020   Influenza Whole 10/24/2007, 08/31/2008, 08/15/2009, 11/08/2010   Influenza,inj,Quad PF,6+ Mos 07/17/2013, 07/21/2014, 08/16/2015   Influenza-Unspecified 08/14/2016, 07/30/2017, 09/12/2018   Moderna SARS-COV2 Booster Vaccination 11/06/2020   PFIZER(Purple Top)SARS-COV-2 Vaccination 01/05/2020, 01/26/2020   Pneumococcal Conjugate-13 09/25/2016   Pneumococcal Polysaccharide-23 09/24/2017   Td 08/15/2009   Tdap 03/25/2018   Zoster Recombinat (Shingrix) 07/30/2017, 12/26/2017   Zoster, Live 01/19/2014    TDAP status: Up to date  Flu Vaccine status: Up to date  Pneumococcal vaccine status: Up to date  Covid-19 vaccine status: Completed vaccines  Qualifies for Shingles Vaccine? Yes   Zostavax completed Yes   Shingrix Completed?: Yes  Screening Tests Health Maintenance  Topic Date Due   Hepatitis C Screening  Never done   COLONOSCOPY (Pts 45-52yr Insurance coverage will need to be confirmed)  11/15/2017   COVID-19 Vaccine (4 - Booster for Pfizer series) 03/07/2021   INFLUENZA VACCINE  06/12/2021   PNA vac Low Risk Adult (2 of 2 - PPSV23) 09/24/2022   TETANUS/TDAP  03/25/2028   Zoster  Vaccines- Shingrix  Completed   HPV VACCINES  Aged Out    Health Maintenance  Health Maintenance Due  Topic Date Due   Hepatitis C Screening  Never done   COLONOSCOPY (Pts 45-50yr Insurance coverage will need to be confirmed)  11/15/2017   COVID-19 Vaccine (4 - Booster for Pfizer series) 03/07/2021   INFLUENZA VACCINE  06/12/2021    Colorectal cancer screening: Referral to GI placed 07/07/2021. Pt aware the office will call re: appt.  Lung Cancer Screening: (Low Dose CT Chest recommended if Age 68-80years, 30 pack-year currently smoking OR have quit w/in 15years.) does not qualify.   Lung Cancer Screening Referral: no  Additional Screening:  Hepatitis C Screening: does qualify; Completed no  Vision Screening: Recommended annual ophthalmology exams for early detection of glaucoma and other disorders of the eye. Is the patient up to date with their annual eye exam?  Yes  Who is the provider or what is the name of the office in which the patient attends annual eye exams? Dr. DCalvert CantorIf pt is not established with a provider, would they like to be referred to a provider to establish care? No .   Dental Screening: Recommended annual dental exams for proper oral hygiene  Community Resource Referral / Chronic Care Management: CRR required this visit?  No   CCM required this visit?  No      Plan:     I have personally reviewed and noted the following in the patient's chart:   Medical and social history Use of alcohol, tobacco or illicit drugs  Current medications and supplements including opioid prescriptions. Patient is not currently taking opioid prescriptions. Functional ability and status Nutritional status Physical activity Advanced directives List of other physicians Hospitalizations, surgeries, and ER visits in previous 12 months Vitals Screenings to include cognitive, depression, and falls Referrals and appointments  In addition, I have reviewed and  discussed with patient certain preventive protocols, quality metrics, and best practice recommendations. A written personalized care plan for preventive services as well as general preventive health recommendations were provided to patient.     SSheral Flow LPN   [id number]  Nurse Notes: n/a  Medical screening examination/treatment/procedure(s) were performed by non-physician practitioner and as supervising physician I was immediately available for consultation/collaboration.  I agree with above. ALew Dawes MD

## 2021-07-07 NOTE — Patient Instructions (Signed)
[name] , Thank you for taking time to come for your Medicare Wellness Visit. I appreciate your ongoing commitment to your health goals. Please review the following plan we discussed and let me know if I can assist you in the future.   Screening recommendations/referrals: Colonoscopy: 11/15/2014; due every 3 years; referral placed 07/07/2021 Recommended yearly ophthalmology/optometry visit for glaucoma screening and checkup Recommended yearly dental visit for hygiene and checkup  Vaccinations: Influenza vaccine: 08/12/2020 Pneumococcal vaccine: 09/25/2016, 09/24/2017 Tdap vaccine: 03/25/2018; due every 10 years Shingles vaccine: 07/30/2017, 12/26/2017   Covid-19: 01/05/2020, 01/26/2020, 11/06/2020  Advanced directives: Advance directive discussed with you today. Even though you declined this today please call our office should you change your mind and we can give you the proper paperwork for you to fill out.  Conditions/risks identified: Yes; Client understands the importance of follow-up with providers by attending scheduled visits and discussed goals to eat healthier, increase physical activity, exercise the brain, socialize more, get enough sleep and make time for laughter.  Next appointment: Please schedule your next Medicare Wellness Visit with your Nurse Health Advisor in 1 year by calling [phone].  Preventive Care 33 Years and Older, Male Preventive care refers to lifestyle choices and visits with your health care provider that can promote health and wellness. What does preventive care include? A yearly physical exam. This is also called an annual well check. Dental exams once or twice a year. Routine eye exams. Ask your health care provider how often you should have your eyes checked. Personal lifestyle choices, including: Daily care of your teeth and gums. Regular physical activity. Eating a healthy diet. Avoiding tobacco and drug use. Limiting alcohol use. Practicing safe  sex. Taking low doses of aspirin every day. Taking vitamin and mineral supplements as recommended by your health care provider. What happens during an annual well check? The services and screenings done by your health care provider during your annual well check will depend on your age, overall health, lifestyle risk factors, and family history of disease. Counseling  Your health care provider may ask you questions about your: Alcohol use. Tobacco use. Drug use. Emotional well-being. Home and relationship well-being. Sexual activity. Eating habits. History of falls. Memory and ability to understand (cognition). Work and work Statistician. Screening  You may have the following tests or measurements: Height, weight, and BMI. Blood pressure. Lipid and cholesterol levels. These may be checked every 5 years, or more frequently if you are over 77 years old. Skin check. Lung cancer screening. You may have this screening every year starting at age 38 if you have a 30-pack-year history of smoking and currently smoke or have quit within the past 15 years. Fecal occult blood test (FOBT) of the stool. You may have this test every year starting at age 19. Flexible sigmoidoscopy or colonoscopy. You may have a sigmoidoscopy every 5 years or a colonoscopy every 10 years starting at age 54. Prostate cancer screening. Recommendations will vary depending on your family history and other risks. Hepatitis C blood test. Hepatitis B blood test. Sexually transmitted disease (STD) testing. Diabetes screening. This is done by checking your blood sugar (glucose) after you have not eaten for a while (fasting). You may have this done every 1-3 years. Abdominal aortic aneurysm (AAA) screening. You may need this if you are a current or former smoker. Osteoporosis. You may be screened starting at age 58 if you are at high risk. Talk with your health care provider about your test results, treatment  options, and if  necessary, the need for more tests. Vaccines  Your health care provider may recommend certain vaccines, such as: Influenza vaccine. This is recommended every year. Tetanus, diphtheria, and acellular pertussis (Tdap, Td) vaccine. You may need a Td booster every 10 years. Zoster vaccine. You may need this after age 27. Pneumococcal 13-valent conjugate (PCV13) vaccine. One dose is recommended after age 45. Pneumococcal polysaccharide (PPSV23) vaccine. One dose is recommended after age 17. Talk to your health care provider about which screenings and vaccines you need and how often you need them. This information is not intended to replace advice given to you by your health care provider. Make sure you discuss any questions you have with your health care provider. Document Released: 11/25/2015 Document Revised: 07/18/2016 Document Reviewed: 08/30/2015 Elsevier Interactive Patient Education  2017 [place] Prevention in the Home Falls can cause injuries. They can happen to people of all ages. There are many things you can do to make your home safe and to help prevent falls. What can I do on the outside of my home? Regularly fix the edges of walkways and driveways and fix any cracks. Remove anything that might make you trip as you walk through a door, such as a raised step or threshold. Trim any bushes or trees on the path to your home. Use bright outdoor lighting. Clear any walking paths of anything that might make someone trip, such as rocks or tools. Regularly check to see if handrails are loose or broken. Make sure that both sides of any steps have handrails. Any raised decks and porches should have guardrails on the edges. Have any leaves, snow, or ice cleared regularly. Use sand or salt on walking paths during winter. Clean up any spills in your garage right away. This includes oil or grease spills. What can I do in the bathroom? Use night lights. Install grab bars by the toilet  and in the tub and shower. Do not use towel bars as grab bars. Use non-skid mats or decals in the tub or shower. If you need to sit down in the shower, use a plastic, non-slip stool. Keep the floor dry. Clean up any water that spills on the floor as soon as it happens. Remove soap buildup in the tub or shower regularly. Attach bath mats securely with double-sided non-slip rug tape. Do not have throw rugs and other things on the floor that can make you trip. What can I do in the bedroom? Use night lights. Make sure that you have a light by your bed that is easy to reach. Do not use any sheets or blankets that are too big for your bed. They should not hang down onto the floor. Have a firm chair that has side arms. You can use this for support while you get dressed. Do not have throw rugs and other things on the floor that can make you trip. What can I do in the kitchen? Clean up any spills right away. Avoid walking on wet floors. Keep items that you use a lot in easy-to-reach places. If you need to reach something above you, use a strong step stool that has a grab bar. Keep electrical cords out of the way. Do not use floor polish or wax that makes floors slippery. If you must use wax, use non-skid floor wax. Do not have throw rugs and other things on the floor that can make you trip. What can I do with my stairs? Do not  leave any items on the stairs. Make sure that there are handrails on both sides of the stairs and use them. Fix handrails that are broken or loose. Make sure that handrails are as long as the stairways. Check any carpeting to make sure that it is firmly attached to the stairs. Fix any carpet that is loose or worn. Avoid having throw rugs at the top or bottom of the stairs. If you do have throw rugs, attach them to the floor with carpet tape. Make sure that you have a light switch at the top of the stairs and the bottom of the stairs. If you do not have them, ask someone to add  them for you. What else can I do to help prevent falls? Wear shoes that: Do not have high heels. Have rubber bottoms. Are comfortable and fit you well. Are closed at the toe. Do not wear sandals. If you use a stepladder: Make sure that it is fully opened. Do not climb a closed stepladder. Make sure that both sides of the stepladder are locked into place. Ask someone to hold it for you, if possible. Clearly mark and make sure that you can see: Any grab bars or handrails. First and last steps. Where the edge of each step is. Use tools that help you move around (mobility aids) if they are needed. These include: Canes. Walkers. Scooters. Crutches. Turn on the lights when you go into a dark area. Replace any light bulbs as soon as they burn out. Set up your furniture so you have a clear path. Avoid moving your furniture around. If any of your floors are uneven, fix them. If there are any pets around you, be aware of where they are. Review your medicines with your doctor. Some medicines can make you feel dizzy. This can increase your chance of falling. Ask your doctor what other things that you can do to help prevent falls. This information is not intended to replace advice given to you by your health care provider. Make sure you discuss any questions you have with your health care provider. Document Released: 08/25/2009 Document Revised: 04/05/2016 Document Reviewed: 12/03/2014 Elsevier Interactive Patient Education  2017 [employer].

## 2021-08-04 ENCOUNTER — Telehealth: Payer: Self-pay | Admitting: *Deleted

## 2021-08-04 NOTE — Telephone Encounter (Signed)
Patient is on Eliquis, therefore needs office visit prior to scheduling a procedure. I have spoken to patient and scheduled him for office visit to discuss colonoscopy on 09/14/21 at 930 am. Patient is in agreement with this plan.

## 2021-08-04 NOTE — Telephone Encounter (Signed)
===  View-only below this line=== ----- Message ----- From: Jerene Bears, MD Sent: 08/03/2021   4:58 PM EDT To: Larina Bras, CMA Subject: Colon recall                                   I bumped in to a guy who said he's overdue for colon.  Can you get him a previsit J R Beals DOB 11/30/1950  Thanks  JMP

## 2021-08-11 ENCOUNTER — Encounter: Payer: Self-pay | Admitting: *Deleted

## 2021-08-15 ENCOUNTER — Other Ambulatory Visit: Payer: Self-pay | Admitting: *Deleted

## 2021-08-15 MED ORDER — APIXABAN 5 MG PO TABS: 5.0000 mg | ORAL_TABLET | Freq: Two times a day (BID) | ORAL | 1 refills | Status: DC

## 2021-08-15 NOTE — Telephone Encounter (Signed)
Eliquis 5mg  refill request received. Patient is 70 years old, weight-78.7kg, Crea-1.14 on 06/20/21, Diagnosis-Afib, and last seen by Dr. Rayann Heman on 12/07/2020. Dose is appropriate based on dosing criteria. Will send in refill to requested pharmacy.

## 2021-09-14 ENCOUNTER — Telehealth: Payer: Self-pay | Admitting: *Deleted

## 2021-09-14 ENCOUNTER — Encounter: Payer: Self-pay | Admitting: Internal Medicine

## 2021-09-14 ENCOUNTER — Ambulatory Visit (INDEPENDENT_AMBULATORY_CARE_PROVIDER_SITE_OTHER): Payer: 59 | Admitting: Internal Medicine

## 2021-09-14 VITALS — BP 140/80 | HR 61 | Ht 68.0 in | Wt 178.0 lb

## 2021-09-14 DIAGNOSIS — Z7901 Long term (current) use of anticoagulants: Secondary | ICD-10-CM | POA: Diagnosis not present

## 2021-09-14 DIAGNOSIS — Z86010 Personal history of colonic polyps: Secondary | ICD-10-CM | POA: Diagnosis not present

## 2021-09-14 DIAGNOSIS — I480 Paroxysmal atrial fibrillation: Secondary | ICD-10-CM

## 2021-09-14 MED ORDER — SUTAB 1479-225-188 MG PO TABS: ORAL_TABLET | ORAL | 0 refills | Status: DC

## 2021-09-14 NOTE — Telephone Encounter (Signed)
Request for surgical clearance:     Endoscopy Procedure  What type of surgery is being performed?     colonoscopy  When is this surgery scheduled?     10/24/21  What type of clearance is required ?   Pharmacy  Are there any medications that need to be held prior to surgery and how long? Eliquis, 2 days  Practice name and name of physician performing surgery?      [facility]  What is your office phone and fax number?      Phone- [phone]  [phone]  Anesthesia type (None, local, MAC, general) ?       MAC

## 2021-09-14 NOTE — Progress Notes (Signed)
HPI: Evan Farmer is a 70 year old male with a history of adenomatous and sessile serrated colon polyps, hypertension, hyperlipidemia, A. fib on Eliquis who is seen to discuss surveillance colonoscopy.  He is here alone today.  He had his last colonoscopy with me on 11/15/2014.  This revealed 3 polyps ranging in size from 5 to 9 mm.  2 of these were adenomatous, the other sessile serrated polyp without high-grade dysplasia.  There was diverticulosis in the left colon.  He reports that he has been doing well.  He reports that due to [place] he delayed his surveillance colonoscopy.  He is without abdominal pain, bowel habit changes, blood in stool or melena.  He denies upper GI or hepatobiliary complaint today.  He remains active working 3 days a week at [employer].  He recently returned from a trip to [place] for 2 weeks.  He had a great time.  He did come back with a mild URI and was tested twice negative for COVID.  Past Medical History:  Diagnosis Date   Depression    Diverticulosis    HTN (hypertension)    Hyperlipidemia    Paroxysmal A-fib [facility])    Prostate cancer [facility])    2010 Dr Terance Hart    [facility] spotted fever    age 74   S/P cardiac cath 02/2011   No obstructive disease. Normal LV function   Scoliosis    TIA (transient ischemic attack)    2012   Tubular adenoma of colon    Vitamin B 12 deficiency    Vitamin D deficiency    Weight gain     Past Surgical History:  Procedure Laterality Date   CARDIOVERSION N/A 07/10/2016   Procedure: CARDIOVERSION;  Surgeon: Lelon Perla, MD;  Location: [facility] ENDOSCOPY;  Service: Cardiovascular;  Laterality: N/A;   CATARACT EXTRACTION W/ INTRAOCULAR LENS  IMPLANT, BILATERAL     COLONOSCOPY  01-24-2005   TICS ONLY    [place] and 2003   twice   NASAL SEPTUM SURGERY     PROSTATECTOMY  11/2008   Robotic    TEE WITHOUT CARDIOVERSION N/A 07/10/2016   Procedure: TRANSESOPHAGEAL ECHOCARDIOGRAM (TEE);  Surgeon:  Lelon Perla, MD;  Location: [facility] ENDOSCOPY;  Service: Cardiovascular;  Laterality: N/A;    Outpatient Medications Prior to Visit  Medication Sig Dispense Refill   ALPRAZolam (XANAX) 0.5 MG tablet Take 1 tablet (0.5 mg total) by mouth 2 (two) times daily as needed for anxiety. 180 tablet 1   apixaban (ELIQUIS) 5 MG TABS tablet Take 1 tablet (5 mg total) by mouth 2 (two) times daily. 180 tablet 1   atenolol (TENORMIN) 50 MG tablet TAKE 1 TABLET(50 MG) BY MOUTH DAILY. 90 tablet 3   Cholecalciferol (VITAMIN D3) 50 MCG (2000 UT) capsule Take 1 capsule (2,000 Units total) by mouth daily. 100 capsule 3   flecainide (TAMBOCOR) 100 MG tablet Take 1 tablet (100 mg total) by mouth 2 (two) times daily. 30 tablet 0   ibuprofen (ADVIL,MOTRIN) 200 MG tablet Take 200-400 mg by mouth 2 (two) times daily as needed (pain).     losartan (COZAAR) 25 MG tablet Take 1 tablet (25 mg total) by mouth daily. 90 tablet 3   sildenafil (VIAGRA) 100 MG tablet Take 1 tablet (100 mg total) by mouth daily as needed for erectile dysfunction. 12 tablet 5   No facility-administered medications prior to visit.    Allergies  Allergen Reactions  Procainamide Hcl Other (See Comments)     sun dermatitis   Quinidine Other (See Comments)    High fever    Family History  Problem Relation Age of Onset   Arrhythmia Mother    Atrial fibrillation Mother    Cancer Father    Lung cancer Other    Prostate cancer Other    Colon cancer Paternal Uncle    Rectal cancer Neg Hx    Stomach cancer Neg Hx     Social History   Tobacco Use   Smoking status: Never   Smokeless tobacco: Never  Vaping Use   Vaping Use: Never used  Substance Use Topics   Alcohol use: Yes    Alcohol/week: 8.0 standard drinks    Types: 4 Glasses of wine, 4 Shots of liquor per week    Comment: Social alcohol use   Drug use: No    ROS: As per history of present illness, otherwise negative  BP 140/80   Pulse 61   Ht 5\' 8"  (1.727 m)   Wt 178 lb  (80.7 kg)   BMI 27.06 kg/m  Gen: awake, alert, NAD HEENT: anicteric CV: RRR, no mrg Pulm: CTA b/l Abd: soft, NT/ND, +BS throughout Ext: no c/c/e Neuro: nonfocal   RELEVANT LABS AND IMAGING: CBC    Component Value Date/Time   WBC 3.7 (L) 06/20/2021 1109   RBC 4.77 06/20/2021 1109   HGB 16.0 06/20/2021 1109   HGB 16.4 12/02/2017 1028   HCT 47.6 06/20/2021 1109   HCT 46.5 12/02/2017 1028   PLT 136.0 (L) 06/20/2021 1109   PLT 164 12/02/2017 1028   MCV 99.7 06/20/2021 1109   MCV 99 (H) 12/02/2017 1028   MCH 34.2 (H) 06/20/2020 0958   MCHC 33.7 06/20/2021 1109   RDW 13.2 06/20/2021 1109   RDW 13.5 12/02/2017 1028   LYMPHSABS 0.6 (L) 06/20/2021 1109   LYMPHSABS 0.8 12/02/2017 1028   MONOABS 0.3 06/20/2021 1109   EOSABS 0.0 06/20/2021 1109   EOSABS 0.0 12/02/2017 1028   BASOSABS 0.0 06/20/2021 1109   BASOSABS 0.0 12/02/2017 1028    CMP     Component Value Date/Time   NA 135 06/20/2021 1109   NA 141 12/02/2017 1028   K 4.3 06/20/2021 1109   CL 100 06/20/2021 1109   CO2 29 06/20/2021 1109   GLUCOSE 93 06/20/2021 1109   BUN 20 06/20/2021 1109   BUN 15 12/02/2017 1028   CREATININE 1.14 06/20/2021 1109   CREATININE 1.10 06/20/2020 0958   CALCIUM 8.9 06/20/2021 1109   PROT 6.7 06/20/2021 1109   ALBUMIN 4.0 06/20/2021 1109   AST 16 06/20/2021 1109   ALT 11 06/20/2021 1109   ALKPHOS 54 06/20/2021 1109   BILITOT 1.2 06/20/2021 1109   GFRNONAA 69 06/20/2020 0958   GFRAA 80 06/20/2020 0958    ASSESSMENT/PLAN: 70 year old male with a history of adenomatous and sessile serrated colon polyps, hypertension, hyperlipidemia, A. fib on Eliquis who is seen to discuss surveillance colonoscopy.   History of adenomatous and sessile serrated colon polyps --somewhat overdue for surveillance colonoscopy.  We discussed the risk, benefits and alternatives and he is agreeable and wishes to proceed with colonoscopy at this time.  We discussed interrupting Eliquis therapy for at least 48  hours in order to proceed with colonoscopy.  We will discuss this with Dr. Rayann Heman who prescribes this medication for him. --Colonoscopy in the [place]  2.  Atrial fibrillation on Eliquis --no A. fib of late though he remains  on Eliquis for prophylaxis.  He sees Dr. Rayann Heman. Will hold Eliquis 2 days prior to endoscopic procedures - will instruct when and how to resume after procedure. Benefits and risks of procedure explained including risks of bleeding, perforation, infection, missed lesions, reactions to medications and possible need for hospitalization and surgery for complications. Additional rare but real risk of stroke or other vascular clotting events off Eliquis also explained and need to seek urgent help if any signs of these problems occur. Will communicate by phone or EMR with patient's  prescribing provider to confirm that holding Eliquis is reasonable in this case.      IF:[id number], Evie Lacks, [place],  [place] [id number]

## 2021-09-14 NOTE — Patient Instructions (Signed)
You have been scheduled for a colonoscopy. Please follow written instructions given to you at your visit today.  Please pick up your prep supplies at the pharmacy within the next 1-3 days. If you use inhalers (even only as needed), please bring them with you on the day of your procedure.  If you are age 68 or older, your body mass index should be between 23-30. Your Body mass index is 27.06 kg/m. If this is out of the aforementioned range listed, please consider follow up with your Primary Care Provider.  If you are age 59 or younger, your body mass index should be between 19-25. Your Body mass index is 27.06 kg/m. If this is out of the aformentioned range listed, please consider follow up with your Primary Care Provider.   ________________________________________________________  The [place] [facility] providers would like to encourage you to use [facility] to communicate with providers for non-urgent requests or questions.  Due to long hold times on the telephone, sending your provider a message by [facility] may be a faster and more efficient way to get a response.  Please allow 48 business hours for a response.  Please remember that this is for non-urgent requests.  _______________________________________________________  Due to recent changes in healthcare laws, you may see the results of your imaging and laboratory studies on MyChart before your provider has had a chance to review them.  We understand that in some cases there may be results that are confusing or concerning to you. Not all laboratory results come back in the same time frame and the provider may be waiting for multiple results in order to interpret others.  Please give [place] 48 hours in order for your provider to thoroughly review all the results before contacting the office for clarification of your results.

## 2021-09-18 NOTE — Telephone Encounter (Signed)
Please see recommendation by clinical pharmacist.

## 2021-09-18 NOTE — Telephone Encounter (Signed)
Dr Hilarie Fredrickson- Madaline Brilliant to hold Eliquis only 1 day as recommended by cards pharmacy?

## 2021-09-18 NOTE — Telephone Encounter (Addendum)
Patient with diagnosis of atrial fibrillation on Eliquis for anticoagulation.    Procedure: colonoscopy Date of procedure: 10/24/21  CHA2DS2-VASc Score = 4  This indicates a 4.8% annual risk of stroke. The patient's score is based upon: CHF History: 0 HTN History: 1 Diabetes History: 0 Stroke History: 2 Vascular Disease History: 0 Age Score: 1 Gender Score: 0  Patient had questionable TIA in 01/2011, but with negative CT/MRI  CrCl 71 (Scr 1.14 06/20/21) Platelet count 136  Given prior questionable TIA, prefer pt hold Eliquis for 1 day prior to procedure. If absolutely needed, would be ok to hold for 2 days prior.

## 2021-09-19 ENCOUNTER — Telehealth: Payer: Self-pay | Admitting: Internal Medicine

## 2021-09-19 NOTE — Telephone Encounter (Signed)
Patient calling in  Says he has been having cough/congestion for about 4-5 weeks but it comes & goes  Wants to know what OTC meds provider recommends or what other opts he has available  Please call [phone]

## 2021-09-19 NOTE — Telephone Encounter (Signed)
Finally have him hold it for 3 doses (he takes this BID), which is a bit of a compromise.  I see that they approved 1 or 2 days if necessary He is overdue for surveillance and the chance for polypectomy is considered likely

## 2021-09-20 NOTE — Telephone Encounter (Signed)
Try over-the-counter Claritin and Flonase daily.  Office visit if not better.  Thanks

## 2021-09-20 NOTE — Telephone Encounter (Signed)
I have left a message for patient to call back. 

## 2021-09-20 NOTE — Telephone Encounter (Signed)
I have spoken to the patient to advise that he should hold Eliquis x 3 doses (1.5 days) prior to his upcoming procedure. Patient verbalizes clear understanding of this information.

## 2021-09-21 NOTE — Telephone Encounter (Signed)
Called pt there ws no answer LMOM w/MD response.Marland KitchenJohny Farmer

## 2021-10-24 ENCOUNTER — Ambulatory Visit (AMBULATORY_SURGERY_CENTER): Payer: 59 | Admitting: Internal Medicine

## 2021-10-24 ENCOUNTER — Encounter: Payer: Self-pay | Admitting: Internal Medicine

## 2021-10-24 ENCOUNTER — Other Ambulatory Visit: Payer: Self-pay

## 2021-10-24 VITALS — BP 128/63 | HR 52 | Temp 97.5°F | Resp 19 | Ht 68.0 in | Wt 178.0 lb

## 2021-10-24 DIAGNOSIS — D125 Benign neoplasm of sigmoid colon: Secondary | ICD-10-CM

## 2021-10-24 DIAGNOSIS — Z86010 Personal history of colonic polyps: Secondary | ICD-10-CM | POA: Diagnosis present

## 2021-10-24 DIAGNOSIS — D122 Benign neoplasm of ascending colon: Secondary | ICD-10-CM

## 2021-10-24 MED ORDER — SODIUM CHLORIDE 0.9 % IV SOLN: 500.0000 mL | Freq: Once | INTRAVENOUS | Status: DC

## 2021-10-24 NOTE — Progress Notes (Signed)
GASTROENTEROLOGY PROCEDURE H&P NOTE   Primary Care Physician: Cassandria Anger, MD    Reason for Procedure:  History of colon polyps  Plan:    Surveillance colonoscopy  Patient is appropriate for endoscopic procedure(s) in the ambulatory ([place]) setting.  The nature of the procedure, as well as the risks, benefits, and alternatives were carefully and thoroughly reviewed with the patient. Ample time for discussion and questions allowed. The patient understood, was satisfied, and agreed to proceed.     HPI: Evan Farmer is a 70 y.o. male who presents for surveillance colonoscopy.  Anticoagulation on hold.  See recent office note for details.  No recent chest pain or shortness of breath.  Tolerated the prep.  No abdominal pain today.  Past Medical History:  Diagnosis Date   Anxiety    Cataract    Depression    Diverticulosis    HTN (hypertension)    Hyperlipidemia    Paroxysmal A-fib ([place])    Prostate cancer [facility])    2010 Dr Terance Hart    [facility] spotted fever    age 61   S/P cardiac cath 02/2011   No obstructive disease. Normal LV function   Scoliosis    TIA (transient ischemic attack)    2012   Tubular adenoma of colon    Vitamin B 12 deficiency    Vitamin D deficiency    Weight gain     Past Surgical History:  Procedure Laterality Date   CARDIOVERSION N/A 07/10/2016   Procedure: CARDIOVERSION;  Surgeon: Lelon Perla, MD;  Location: [facility] ENDOSCOPY;  Service: Cardiovascular;  Laterality: N/A;   CATARACT EXTRACTION W/ INTRAOCULAR LENS  IMPLANT, BILATERAL     COLONOSCOPY  01-24-2005   TICS ONLY    [place] and 2003   twice   NASAL SEPTUM SURGERY     PROSTATECTOMY  11/2008   Robotic    TEE WITHOUT CARDIOVERSION N/A 07/10/2016   Procedure: TRANSESOPHAGEAL ECHOCARDIOGRAM (TEE);  Surgeon: Lelon Perla, MD;  Location: [facility] ENDOSCOPY;  Service: Cardiovascular;  Laterality: N/A;    Prior to Admission medications   Medication  Sig Start Date End Date Taking? Authorizing Provider  ALPRAZolam Duanne Moron) 0.5 MG tablet Take 1 tablet (0.5 mg total) by mouth 2 (two) times daily as needed for anxiety. 04/24/21  Yes Plotnikov, Evie Lacks, MD  atenolol (TENORMIN) 50 MG tablet TAKE 1 TABLET(50 MG) BY MOUTH DAILY. 12/07/20  Yes Allred, Jeneen Rinks, MD  Cholecalciferol (VITAMIN D3) 50 MCG (2000 UT) capsule Take 1 capsule (2,000 Units total) by mouth daily. 12/20/20  Yes Plotnikov, Evie Lacks, MD  flecainide (TAMBOCOR) 100 MG tablet Take 1 tablet (100 mg total) by mouth 2 (two) times daily. 12/12/20  Yes Allred, Jeneen Rinks, MD  losartan (COZAAR) 25 MG tablet Take 1 tablet (25 mg total) by mouth daily. 12/07/20  Yes Allred, Jeneen Rinks, MD  sildenafil (VIAGRA) 100 MG tablet Take 1 tablet (100 mg total) by mouth daily as needed for erectile dysfunction. 12/20/20  Yes Plotnikov, Evie Lacks, MD  apixaban (ELIQUIS) 5 MG TABS tablet Take 1 tablet (5 mg total) by mouth 2 (two) times daily. 08/15/21   Allred, Jeneen Rinks, MD  ibuprofen (ADVIL,MOTRIN) 200 MG tablet Take 200-400 mg by mouth 2 (two) times daily as needed (pain).    [provider]    Current Outpatient Medications  Medication Sig Dispense Refill   ALPRAZolam (XANAX) 0.5 MG tablet Take 1 tablet (0.5 mg total) by  mouth 2 (two) times daily as needed for anxiety. 180 tablet 1   atenolol (TENORMIN) 50 MG tablet TAKE 1 TABLET(50 MG) BY MOUTH DAILY. 90 tablet 3   Cholecalciferol (VITAMIN D3) 50 MCG (2000 UT) capsule Take 1 capsule (2,000 Units total) by mouth daily. 100 capsule 3   flecainide (TAMBOCOR) 100 MG tablet Take 1 tablet (100 mg total) by mouth 2 (two) times daily. 30 tablet 0   losartan (COZAAR) 25 MG tablet Take 1 tablet (25 mg total) by mouth daily. 90 tablet 3   sildenafil (VIAGRA) 100 MG tablet Take 1 tablet (100 mg total) by mouth daily as needed for erectile dysfunction. 12 tablet 5   apixaban (ELIQUIS) 5 MG TABS tablet Take 1 tablet (5 mg total) by mouth 2 (two) times daily. 180 tablet 1    ibuprofen (ADVIL,MOTRIN) 200 MG tablet Take 200-400 mg by mouth 2 (two) times daily as needed (pain).     Current Facility-Administered Medications  Medication Dose Route Frequency Provider Last Rate Last Admin   0.9 %  sodium chloride infusion  500 mL Intravenous Once Yobana Culliton, Lajuan Lines, MD        Allergies as of 10/24/2021 - Review Complete 10/24/2021  Allergen Reaction Noted   Procainamide hcl Other (See Comments) 04/23/2008   Quinidine Other (See Comments) 01/09/2013    Family History  Problem Relation Age of Onset   Arrhythmia Mother    Atrial fibrillation Mother    Cancer Father    Lung cancer Other    Prostate cancer Other    Colon cancer Paternal Uncle    Rectal cancer Neg Hx    Stomach cancer Neg Hx     Social History   Socioeconomic History   Marital status: Divorced    Spouse name: Not on file   Number of children: Not on file   Years of education: Not on file   Highest education level: Not on file  Occupational History   Not on file  Tobacco Use   Smoking status: Never   Smokeless tobacco: Never  Vaping Use   Vaping Use: Never used  Substance and Sexual Activity   Alcohol use: Yes    Alcohol/week: 8.0 standard drinks    Types: 4 Glasses of wine, 4 Shots of liquor per week    Comment: Social alcohol use   Drug use: No   Sexual activity: Not on file  Other Topics Concern   Not on file  Social History Narrative   Regular Exercise -  YES   Social Determinants of Health   Financial Resource Strain: Low Risk    Difficulty of Paying Living Expenses: Not hard at all  Food Insecurity: No Food Insecurity   Worried About Charity fundraiser in the Last Year: Never true   Ran Out of Food in the Last Year: Never true  Transportation Needs: No Transportation Needs   Lack of Transportation (Medical): No   Lack of Transportation (Non-Medical): No  Physical Activity: Sufficiently Active   Days of Exercise per Week: 5 days   Minutes of Exercise per Session: 30 min   Stress: No Stress Concern Present   Feeling of Stress : Not at all  Social Connections: Socially Integrated   Frequency of Communication with Friends and Family: More than three times a week   Frequency of Social Gatherings with Friends and Family: More than three times a week   Attends Religious Services: More than 4 times per year   Active Member  of Clubs or Organizations: Yes   Attends Music therapist: More than 4 times per year   Marital Status: Living with partner  Intimate Partner Violence: Not At Risk   Fear of Current or Ex-Partner: No   Emotionally Abused: No   Physically Abused: No   Sexually Abused: No    Physical Exam: Vital signs in last 24 hours: @BP  130/74    Pulse (!) 57    Temp (!) 97.5 F (36.4 C)    Ht 5\' 8"  (1.727 m)    Wt 178 lb (80.7 kg)    SpO2 99%    BMI 27.06 kg/m  GEN: NAD EYE: Sclerae anicteric ENT: MMM CV: Non-tachycardic Pulm: CTA b/l GI: Soft, NT/ND NEURO:  Alert & Oriented x 3   Zenovia Jarred, MD [facility]  10/24/2021 1:39 PM

## 2021-10-24 NOTE — Progress Notes (Signed)
Called to room to assist during endoscopic procedure.  Patient ID and intended procedure confirmed with present staff. Received instructions for my participation in the procedure from the performing physician.  

## 2021-10-24 NOTE — Progress Notes (Signed)
Sedate, gd SR, tolerated procedure well, VSS, report to RN 

## 2021-10-24 NOTE — Op Note (Addendum)
[name] Patient Name: Evan Farmer Procedure Date: 10/24/2021 1:35 PM MRN: [id number] Endoscopist: Jerene Bears , MD Age: 70 Referring MD:  Date of Birth: January 30, 1951 Gender: Male Account #: [id number] Procedure:                Colonoscopy Indications:              High risk colon cancer surveillance: Personal                            history of sessile serrated colon polyp (less than                            10 mm in size) with no dysplasia, , Last                            colonoscopy: January 2016 and tubular adenomas Medicines:                Monitored Anesthesia Care Procedure:                Pre-Anesthesia Assessment:                           - Prior to the procedure, a History and Physical                            was performed, and patient medications and                            allergies were reviewed. The patient's tolerance of                            previous anesthesia was also reviewed. The risks                            and benefits of the procedure and the sedation                            options and risks were discussed with the patient.                            All questions were answered, and informed consent                            was obtained. Prior Anticoagulants: The patient has                            taken Eliquis (apixaban), last dose was 2 days                            prior to procedure. ASA Grade Assessment: II - A                            patient with mild systemic disease. After reviewing  the risks and benefits, the patient was deemed in                            satisfactory condition to undergo the procedure.                           After obtaining informed consent, the colonoscope                            was passed under direct vision. Throughout the                            procedure, the patient's blood pressure, pulse, and                            oxygen saturations  were monitored continuously. The                            CF [id number] #[id number] was introduced through the anus                            and advanced to the cecum, identified by its                            appearance. The colonoscopy was performed without                            difficulty. The patient tolerated the procedure                            well. The quality of the bowel preparation was                            good. The ileocecal valve, appendiceal orifice, and                            rectum were photographed. Scope In: 1:45:41 PM Scope Out: 2:12:43 PM Scope Withdrawal Time: 0 hours 23 minutes 21 seconds  Total Procedure Duration: 0 hours 27 minutes 2 seconds  Findings:                 The digital rectal exam was normal.                           A 15 mm polyp was found in the proximal ascending                            colon. The polyp was sessile. The polyp was removed                            with a piecemeal technique using a cold snare.                            Resection and retrieval were  complete. Area was                            tattooed with an injection of 3 mL of Spot (carbon                            black).                           A 25 mm polyp was found in the distal ascending                            colon. The polyp was sessile. Polypectomy was not                            attempted due to polyp size (too large to be                            excised).                           Two sessile polyps were found in the sigmoid colon.                            The polyps were 5 to 6 mm in size. These polyps                            were removed with a cold snare. Resection and                            retrieval were complete.                           Multiple small and large-mouthed diverticula were                            found in the sigmoid colon, descending colon,                            hepatic flexure and ascending  colon.                           Internal hemorrhoids were found during                            retroflexion. The hemorrhoids were medium-sized. Complications:            No immediate complications. Estimated Blood Loss:     Estimated blood loss was minimal. Impression:               - One 15 mm polyp in the proximal ascending colon,                            removed piecemeal using a cold snare. Resected and  retrieved. Tattooed.                           - One 25 mm polyp in the distal ascending colon.                            Resection not attempted.                           - Two 5 to 6 mm polyps in the sigmoid colon,                            removed with a cold snare. Resected and retrieved.                           - Diverticulosis in the sigmoid colon, in the                            descending colon, at the hepatic flexure and in the                            ascending colon.                           - Internal hemorrhoids. Recommendation:           - Patient has a contact number available for                            emergencies. The signs and symptoms of potential                            delayed complications were discussed with the                            patient. Return to normal activities tomorrow.                            Written discharge instructions were provided to the                            patient.                           - Resume previous diet.                           - Continue present medications.                           - Await pathology results.                           - Hold Eliquis an additional 48 hours given recent  polypectomy.                           - Repeat colonoscopy is recommended for                            surveillance and additional polypectomy (outpatient                            hospital setting) at short interval. The                             colonoscopy date will be determined after pathology                            results from today's exam become available for                            review. Jerene Bears, MD 10/24/2021 2:23:24 PM This report has been signed electronically.

## 2021-10-24 NOTE — Patient Instructions (Addendum)
Handouts on diverticulosis, hemorrhoids, and polyps given to patient. Await pathology results. Repeat colonoscopy for surveillance will be determined based off pathology results - this will likely be done at [facility] previous diet and continue present medications. Hold Eliquis an additional 48 hours. (Don't take until Friday 10/27/21)  YOU HAD AN ENDOSCOPIC PROCEDURE TODAY AT [facility]:   Refer to the procedure report that was given to you for any specific questions about what was found during the examination.  If the procedure report does not answer your questions, please call your gastroenterologist to clarify.  If you requested that your care partner not be given the details of your procedure findings, then the procedure report has been included in a sealed envelope for you to review at your convenience later.  YOU SHOULD EXPECT: Some feelings of bloating in the abdomen. Passage of more gas than usual.  Walking can help get rid of the air that was put into your GI tract during the procedure and reduce the bloating. If you had a lower endoscopy (such as a colonoscopy or flexible sigmoidoscopy) you may notice spotting of blood in your stool or on the toilet paper. If you underwent a bowel prep for your procedure, you may not have a normal bowel movement for a few days.  Please Note:  You might notice some irritation and congestion in your nose or some drainage.  This is from the oxygen used during your procedure.  There is no need for concern and it should clear up in a day or so.  SYMPTOMS TO REPORT IMMEDIATELY:  Following lower endoscopy (colonoscopy or flexible sigmoidoscopy):  Excessive amounts of blood in the stool  Significant tenderness or worsening of abdominal pains  Swelling of the abdomen that is new, acute  Fever of 100F or higher  For urgent or emergent issues, a gastroenterologist can be reached at any hour by calling [phone]. Do not  use MyChart messaging for urgent concerns.    DIET:  We do recommend a small meal at first, but then you may proceed to your regular diet.  Drink plenty of fluids but you should avoid alcoholic beverages for 24 hours.  ACTIVITY:  You should plan to take it easy for the rest of today and you should NOT DRIVE or use heavy machinery until tomorrow (because of the sedation medicines used during the test).    FOLLOW UP: Our staff will call the number listed on your records 48-72 hours following your procedure to check on you and address any questions or concerns that you may have regarding the information given to you following your procedure. If we do not reach you, we will leave a message.  We will attempt to reach you two times.  During this call, we will ask if you have developed any symptoms of COVID 19. If you develop any symptoms (ie: fever, flu-like symptoms, shortness of breath, cough etc.) before then, please call [phone].  If you test positive for Covid 19 in the 2 weeks post procedure, please call and report this information to [place].    If any biopsies were taken you will be contacted by phone or by letter within the next 1-3 weeks.  Please call us at [phone] if you have not heard about the biopsies in 3 weeks.    SIGNATURES/CONFIDENTIALITY: You and/or your care partner have signed paperwork which will be entered into your electronic medical record.  These signatures attest to the  fact that that the information above on your After Visit Summary has been reviewed and is understood.  Full responsibility of the confidentiality of this discharge information lies with you and/or your care-partner.

## 2021-10-26 ENCOUNTER — Telehealth: Payer: Self-pay | Admitting: *Deleted

## 2021-10-26 NOTE — Telephone Encounter (Signed)
°  Follow up Call-  Call back number 10/24/2021  Post procedure Call Back phone  # [phone]  Permission to leave phone message Yes  Some recent data might be hidden     Patient questions:  Do you have a fever, pain , or abdominal swelling? No. Pain Score  0 *  Have you tolerated food without any problems? Yes.    Have you been able to return to your normal activities? Yes.    Do you have any questions about your discharge instructions: Diet   No. Medications  No. Follow up visit  No.  Do you have questions or concerns about your Care? No.  Actions: * If pain score is 4 or above: No action needed, pain <4.  Have you developed a fever since your procedure? no  2.   Have you had an respiratory symptoms (SOB or cough) since your procedure? no  3.   Have you tested positive for COVID 19 since your procedure no  4.   Have you had any family members/close contacts diagnosed with the COVID 19 since your procedure?  no   If yes to any of these questions please route to Joylene John, RN and Joella Prince, RN

## 2021-10-30 ENCOUNTER — Encounter: Payer: Self-pay | Admitting: Internal Medicine

## 2021-11-01 ENCOUNTER — Other Ambulatory Visit: Payer: Self-pay | Admitting: Internal Medicine

## 2021-11-01 ENCOUNTER — Telehealth: Payer: Self-pay | Admitting: *Deleted

## 2021-11-01 ENCOUNTER — Encounter: Payer: Self-pay | Admitting: *Deleted

## 2021-11-01 DIAGNOSIS — Z86010 Personal history of colonic polyps: Secondary | ICD-10-CM

## 2021-11-01 MED ORDER — SUTAB 1479-225-188 MG PO TABS: ORAL_TABLET | ORAL | 0 refills | Status: DC

## 2021-11-01 NOTE — Telephone Encounter (Signed)
Patient has been scheduled at [facility] endoscopy for colonoscopy with EMR (1.5 hours) on 12/19/21 (sooner date than March became available) at 900 am. Patient to arrive at 730 am. Patient has been given verbal prep instructions and has also been sent prep instructions via mychart and [employer]. He verbalizes understanding. Patient also verbalizes understanding that we will get another clearance for him to hold Eliquis prior to his procedure.

## 2021-11-01 NOTE — Telephone Encounter (Signed)
Request for surgical clearance:     Endoscopy Procedure  What type of surgery is being performed?     Colonoscopy with endoscopic mucosal resection  When is this surgery scheduled?     12/19/21  What type of clearance is required ?   Pharmacy  Are there any medications that need to be held prior to surgery and how long? Eliquis 2 days  Practice name and name of physician performing surgery?      [facility]  What is your office phone and fax number?      Phone- [phone]  [phone]  Anesthesia type (None, local, MAC, general) ?       MAC

## 2021-11-01 NOTE — Telephone Encounter (Signed)
-----   Message from Jerene Bears, MD sent at 10/24/2021  2:25 PM EST ----- March hospital sch. 1.5 hr colon at least for EMR Thanks JMP

## 2021-11-02 ENCOUNTER — Other Ambulatory Visit: Payer: Self-pay | Admitting: Internal Medicine

## 2021-11-02 NOTE — Telephone Encounter (Signed)
4 doses is ok Thanks [employer]

## 2021-11-02 NOTE — Telephone Encounter (Signed)
I have spoken to patient to advise that cardiology has okayed him to hold eliquis 2 days prior to his procedure (4 doses). Patient verbalizes understanding of this information.

## 2021-11-02 NOTE — Telephone Encounter (Signed)
° °  The clinical pharmacist has reviewed the patient's upcoming procedure along with past medical history and current medications for  Evan Farmer. Recommendations regarding Eliquis are as follows:  Patient with diagnosis of afib on Eliquis for anticoagulation.     Procedure: Colonoscopy with endoscopic mucosal resection Date of procedure: 12/19/21   CHA2DS2-VASc Score = 5   This indicates a 7.2% annual risk of stroke. The patient's score is based upon: CHF History: 0 HTN History: 1 Diabetes History: 0 Stroke History: 2 (questionable TIA in 2012) Vascular Disease History: 1 (elevated calcium score) Age Score: 1 Gender Score: 0   CrCl 76mL/min Platelet count 136K   Per office protocol, patient can hold Eliquis for 2 days prior to procedure as requested.  Please resume Eliquis as soon as possible following procedure and acceptable hemostasis per surgeon.   I will route this recommendation to the requesting party via Epic fax function and remove from pre-op pool.  Please call with questions.  [name], NP-C    11/02/2021, 12:59 PM [place] [address], [address] Office [phone] Fax [phone]

## 2021-11-02 NOTE — Telephone Encounter (Signed)
Patient with diagnosis of afib on Eliquis for anticoagulation.    Procedure: Colonoscopy with endoscopic mucosal resection Date of procedure: 12/19/21  CHA2DS2-VASc Score = 5   This indicates a 7.2% annual risk of stroke. The patient's score is based upon: CHF History: 0 HTN History: 1 Diabetes History: 0 Stroke History: 2 (questionable TIA in 2012) Vascular Disease History: 1 (elevated calcium score) Age Score: 1 Gender Score: 0  CrCl 70mL/min Platelet count 136K  Per office protocol, patient can hold Eliquis for 2 days prior to procedure as requested.

## 2021-11-02 NOTE — Telephone Encounter (Signed)
Dr Hilarie Fredrickson- Please see 09/14/21 telephone note vs recent clearance. Would you like for me to have patient hold Eliquis for 3 doses as previously agreed upon or for 4 doses as okayed today?

## 2021-11-25 ENCOUNTER — Other Ambulatory Visit: Payer: Self-pay | Admitting: Internal Medicine

## 2021-11-27 NOTE — Telephone Encounter (Signed)
Prescription refill request for Eliquis received. Indication: Afib  Last office visit: 12/07/20 (Allred) - Pt has scheduled appt with Dr Rayann Heman on 01/04/22 Scr: 1.14 (06/20/21)  Age: 71 Weight: 80.7kg  Appropriate dose and refill sent to requested pharmacy.

## 2021-12-11 ENCOUNTER — Ambulatory Visit: Payer: 59 | Admitting: Internal Medicine

## 2021-12-11 ENCOUNTER — Encounter (HOSPITAL_COMMUNITY): Payer: Self-pay | Admitting: Internal Medicine

## 2021-12-11 NOTE — Progress Notes (Signed)
Attempted to obtain medical history via telephone, unable to reach at this time. I left a voicemail to return pre surgical testing department's phone call.  

## 2021-12-18 ENCOUNTER — Other Ambulatory Visit: Payer: Self-pay | Admitting: Internal Medicine

## 2021-12-18 ENCOUNTER — Telehealth: Payer: Self-pay | Admitting: Internal Medicine

## 2021-12-18 MED ORDER — SUTAB 1479-225-188 MG PO TABS: ORAL_TABLET | ORAL | 0 refills | Status: DC

## 2021-12-18 NOTE — Telephone Encounter (Signed)
Evan Farmer has been resent to pharmacy.

## 2021-12-18 NOTE — Telephone Encounter (Signed)
Inbound call from patient need Sutab for upcoming procedure 2/7. Sent to [facility] on [address]

## 2021-12-19 ENCOUNTER — Encounter (HOSPITAL_COMMUNITY)
Admission: RE | Disposition: A | Discharge status: Home / Self Care | Payer: Self-pay | Source: Ambulatory Visit | Attending: Internal Medicine

## 2021-12-19 ENCOUNTER — Other Ambulatory Visit: Payer: Self-pay

## 2021-12-19 ENCOUNTER — Ambulatory Visit (HOSPITAL_COMMUNITY)
Admission: RE | Admit: 2021-12-19 | Discharge: 2021-12-19 | Disposition: A | Discharge status: Home / Self Care | Payer: 59 | Source: Ambulatory Visit | Attending: Internal Medicine | Admitting: Internal Medicine

## 2021-12-19 ENCOUNTER — Other Ambulatory Visit: Payer: Self-pay | Admitting: Internal Medicine

## 2021-12-19 ENCOUNTER — Encounter (HOSPITAL_COMMUNITY): Payer: Self-pay | Admitting: Internal Medicine

## 2021-12-19 ENCOUNTER — Ambulatory Visit (HOSPITAL_COMMUNITY): Payer: 59 | Admitting: Certified Registered"

## 2021-12-19 DIAGNOSIS — I10 Essential (primary) hypertension: Secondary | ICD-10-CM | POA: Diagnosis not present

## 2021-12-19 DIAGNOSIS — Z860100 Personal history of colon polyps, unspecified: Secondary | ICD-10-CM

## 2021-12-19 DIAGNOSIS — K648 Other hemorrhoids: Secondary | ICD-10-CM | POA: Insufficient documentation

## 2021-12-19 DIAGNOSIS — D122 Benign neoplasm of ascending colon: Secondary | ICD-10-CM | POA: Diagnosis not present

## 2021-12-19 DIAGNOSIS — Z09 Encounter for follow-up examination after completed treatment for conditions other than malignant neoplasm: Secondary | ICD-10-CM | POA: Insufficient documentation

## 2021-12-19 DIAGNOSIS — I480 Paroxysmal atrial fibrillation: Secondary | ICD-10-CM | POA: Diagnosis not present

## 2021-12-19 DIAGNOSIS — Z8673 Personal history of transient ischemic attack (TIA), and cerebral infarction without residual deficits: Secondary | ICD-10-CM | POA: Insufficient documentation

## 2021-12-19 DIAGNOSIS — D125 Benign neoplasm of sigmoid colon: Secondary | ICD-10-CM

## 2021-12-19 DIAGNOSIS — K5730 Diverticulosis of large intestine without perforation or abscess without bleeding: Secondary | ICD-10-CM | POA: Diagnosis not present

## 2021-12-19 DIAGNOSIS — I2510 Atherosclerotic heart disease of native coronary artery without angina pectoris: Secondary | ICD-10-CM | POA: Insufficient documentation

## 2021-12-19 HISTORY — PX: COLONOSCOPY WITH PROPOFOL: SHX5780

## 2021-12-19 HISTORY — PX: POLYPECTOMY: SHX5525

## 2021-12-19 HISTORY — PX: SUBMUCOSAL LIFTING INJECTION: SHX6855

## 2021-12-19 HISTORY — PX: SUBMUCOSAL TATTOO INJECTION: SHX6856

## 2021-12-19 HISTORY — PX: HEMOSTASIS CLIP PLACEMENT: SHX6857

## 2021-12-19 HISTORY — PX: ENDOSCOPIC MUCOSAL RESECTION: SHX6839

## 2021-12-19 SURGERY — COLONOSCOPY WITH PROPOFOL
Anesthesia: Monitor Anesthesia Care

## 2021-12-19 MED ORDER — SPOT INK MARKER SYRINGE KIT
PACK | SUBMUCOSAL | Status: DC | PRN
  Administered 2021-12-19: 4 mL via SUBMUCOSAL

## 2021-12-19 MED ORDER — EPHEDRINE SULFATE-NACL 50-0.9 MG/10ML-% IV SOSY
PREFILLED_SYRINGE | INTRAVENOUS | Status: DC | PRN
  Administered 2021-12-19: 5 mg via INTRAVENOUS
  Administered 2021-12-19: 10 mg via INTRAVENOUS
  Administered 2021-12-19: 5 mg via INTRAVENOUS

## 2021-12-19 MED ORDER — LACTATED RINGERS IV SOLN: INTRAVENOUS | Status: DC

## 2021-12-19 MED ORDER — PROPOFOL 500 MG/50ML IV EMUL
INTRAVENOUS | Status: DC | PRN
  Administered 2021-12-19: 135 ug/kg/min via INTRAVENOUS

## 2021-12-19 MED ORDER — PROPOFOL 10 MG/ML IV BOLUS
INTRAVENOUS | Status: DC | PRN
  Administered 2021-12-19: 20 mg via INTRAVENOUS
  Administered 2021-12-19: 30 mg via INTRAVENOUS

## 2021-12-19 MED ORDER — PHENYLEPHRINE HCL (PRESSORS) 10 MG/ML IV SOLN
INTRAVENOUS | Status: AC
  Filled 2021-12-19: qty 1

## 2021-12-19 MED ORDER — SODIUM CHLORIDE 0.9 % IV SOLN: INTRAVENOUS | Status: DC

## 2021-12-19 MED ORDER — PHENYLEPHRINE 40 MCG/ML (10ML) SYRINGE FOR IV PUSH (FOR BLOOD PRESSURE SUPPORT)
PREFILLED_SYRINGE | INTRAVENOUS | Status: DC | PRN
  Administered 2021-12-19: 40 ug via INTRAVENOUS
  Administered 2021-12-19: 120 ug via INTRAVENOUS

## 2021-12-19 MED ORDER — HYDRALAZINE HCL 20 MG/ML IJ SOLN
INTRAMUSCULAR | Status: AC
  Filled 2021-12-19: qty 1

## 2021-12-19 MED ORDER — PROPOFOL 1000 MG/100ML IV EMUL
INTRAVENOUS | Status: AC
  Filled 2021-12-19: qty 100

## 2021-12-19 MED ORDER — PHENYLEPHRINE HCL-NACL 20-0.9 MG/250ML-% IV SOLN
INTRAVENOUS | Status: DC | PRN
  Administered 2021-12-19: 25 ug/min via INTRAVENOUS

## 2021-12-19 MED ORDER — HYDRALAZINE HCL 20 MG/ML IJ SOLN: 10.0000 mg | Freq: Once | INTRAMUSCULAR | Status: DC

## 2021-12-19 SURGICAL SUPPLY — 22 items

## 2021-12-19 NOTE — Transfer of Care (Signed)
Immediate Anesthesia Transfer of Care Note  Patient: Evan Farmer  Procedure(s) Performed: COLONOSCOPY WITH PROPOFOL ENDOSCOPIC MUCOSAL RESECTION POLYPECTOMY SUBMUCOSAL LIFTING INJECTION HEMOSTASIS CLIP PLACEMENT SUBMUCOSAL TATTOO INJECTION  Patient Location: PACU  Anesthesia Type:MAC  Level of Consciousness: drowsy  Airway & Oxygen Therapy: Patient Spontanous Breathing and Patient connected to face mask oxygen  Post-op Assessment: Report given to RN, Post -op Vital signs reviewed and stable and Patient moving all extremities X 4  Post vital signs: Reviewed and stable  Last Vitals:  Vitals Value Taken Time  BP 101/43   Temp    Pulse 73   Resp    SpO2 100     Last Pain:  Vitals:   12/19/21 0805  TempSrc: Temporal  PainSc: 0-No pain         Complications: No notable events documented.

## 2021-12-19 NOTE — Anesthesia Procedure Notes (Signed)
Procedure Name: MAC Date/Time: 12/19/2021 8:48 AM Performed by: Niel Hummer, CRNA Pre-anesthesia Checklist: Patient identified, Emergency Drugs available, Suction available and Patient being monitored Oxygen Delivery Method: Simple face mask

## 2021-12-19 NOTE — Anesthesia Preprocedure Evaluation (Signed)
Anesthesia Evaluation  Patient identified by MRN, date of birth, ID band Patient awake    Reviewed: Allergy & Precautions, NPO status , Patient's Chart, lab work & pertinent test results  Airway Mallampati: II  TM Distance: >3 FB Neck ROM: Full    Dental  (+) Dental Advisory Given   Pulmonary neg pulmonary ROS,    breath sounds clear to auscultation       Cardiovascular hypertension, Pt. on home beta blockers and Pt. on medications + CAD  + dysrhythmias Atrial Fibrillation  Rhythm:Regular Rate:Normal     Neuro/Psych TIA   GI/Hepatic negative GI ROS, Neg liver ROS,   Endo/Other  negative endocrine ROS  Renal/GU negative Renal ROS     Musculoskeletal   Abdominal   Peds  Hematology negative hematology ROS (+)   Anesthesia Other Findings   Reproductive/Obstetrics                             Anesthesia Physical Anesthesia Plan  ASA: 2  Anesthesia Plan: MAC   Post-op Pain Management: Minimal or no pain anticipated   Induction:   PONV Risk Score and Plan: 1 and Propofol infusion and Treatment may vary due to age or medical condition  Airway Management Planned: Natural Airway and Simple Face Mask  Additional Equipment:   Intra-op Plan:   Post-operative Plan:   Informed Consent: I have reviewed the patients History and Physical, chart, labs and discussed the procedure including the risks, benefits and alternatives for the proposed anesthesia with the patient or authorized representative who has indicated his/her understanding and acceptance.       Plan Discussed with:   Anesthesia Plan Comments:         Anesthesia Quick Evaluation

## 2021-12-19 NOTE — H&P (Signed)
GASTROENTEROLOGY PROCEDURE H&P NOTE   Primary Care Physician: Cassandria Anger, MD    Reason for Procedure:  History of colonic polyps including larger flat adenoma not removed at recent colonoscopy  Plan:    EMR for adenomatous colon polyp   The nature of the procedure, as well as the risks, benefits, and alternatives were carefully and thoroughly reviewed with the patient. Ample time for discussion and questions allowed. The patient understood, was satisfied, and agreed to proceed.     HPI: Evan Farmer is a 71 y.o. male who presents for colonoscopy for additional polypectomy.  He has been off Eliquis x2 days.  Tolerated the prep.  No recent chest pain or shortness of breath.  No abdominal pain today.  We discussed the risk, benefits and alternatives to colonoscopy and he is agreeable and wishes to proceed.  We did discuss the increased risk for post polypectomy bleeding when larger polyps were removed and patient is on chronic anticoagulation.  We also discussed the slight increased risk of perforation for polyps greater than a centimeter.  After this discussion he understands and wishes to proceed.  Past Medical History:  Diagnosis Date   Anxiety    Cataract    Depression    Diverticulosis    HTN (hypertension)    Hyperlipidemia    Paroxysmal A-fib ([place])    Prostate cancer [facility])    2010 Dr Terance Hart    [facility] spotted fever    age 75   S/P cardiac cath 02/2011   No obstructive disease. Normal LV function   Scoliosis    TIA (transient ischemic attack)    2012   Tubular adenoma of colon    Vitamin B 12 deficiency    Vitamin D deficiency    Weight gain     Past Surgical History:  Procedure Laterality Date   CARDIOVERSION N/A 07/10/2016   Procedure: CARDIOVERSION;  Surgeon: Lelon Perla, MD;  Location: [facility] ENDOSCOPY;  Service: Cardiovascular;  Laterality: N/A;   CATARACT EXTRACTION W/ INTRAOCULAR LENS  IMPLANT, BILATERAL     COLONOSCOPY  01-24-2005    TICS ONLY    [place] and 2003   twice   NASAL SEPTUM SURGERY     PROSTATECTOMY  11/2008   Robotic    TEE WITHOUT CARDIOVERSION N/A 07/10/2016   Procedure: TRANSESOPHAGEAL ECHOCARDIOGRAM (TEE);  Surgeon: Lelon Perla, MD;  Location: [facility] ENDOSCOPY;  Service: Cardiovascular;  Laterality: N/A;    Prior to Admission medications   Medication Sig Start Date End Date Taking? Authorizing Provider  ALPRAZolam (XANAX) 0.5 MG tablet TAKE 1 TABLET TWICE A DAY  AS NEEDED FOR ANXIETY 11/26/21  Yes Plotnikov, Evie Lacks, MD  atenolol (TENORMIN) 50 MG tablet TAKE 1 TABLET DAILY 11/27/21  Yes Allred, Jeneen Rinks, MD  Cholecalciferol (VITAMIN D3) 50 MCG (2000 UT) capsule Take 1 capsule (2,000 Units total) by mouth daily. 12/20/20  Yes Plotnikov, Evie Lacks, MD  ELIQUIS 5 MG TABS tablet TAKE 1 TABLET TWICE A DAY 11/27/21  Yes Allred, Jeneen Rinks, MD  flecainide (TAMBOCOR) 100 MG tablet TAKE 1 TABLET TWICE A DAY 11/27/21  Yes Allred, Jeneen Rinks, MD  ibuprofen (ADVIL,MOTRIN) 200 MG tablet Take 200-400 mg by mouth 2 (two) times daily as needed (pain).   Yes [provider]  losartan (COZAAR) 25 MG tablet TAKE 1 TABLET DAILY 11/02/21  Yes Allred, Jeneen Rinks, MD  Multiple Vitamins-Minerals (MULTIVITAMIN WITH MINERALS) tablet Take 1 tablet by mouth  daily.   Yes [provider]  sildenafil (VIAGRA) 100 MG tablet Take 1 tablet (100 mg total) by mouth daily as needed for erectile dysfunction. 12/20/20  Yes Plotnikov, Evie Lacks, MD  Sodium Sulfate-Mag Sulfate-KCl (SUTAB) [phone] MG TABS Use as directed for colonoscopy. MANUFACTURER CODES!! BIN: [id number] PCN: CN GROUP: [id number] MEMBER ID: [id number];QIW AS SECONDARY INSURANCE ;NO PRIOR AUTHORIZATION 12/18/21   Nakima Fluegge, Lajuan Lines, MD    Current Facility-Administered Medications  Medication Dose Route Frequency Provider Last Rate Last Admin   0.9 %  sodium chloride infusion   Intravenous Continuous Tia Gelb, Lajuan Lines, MD       lactated ringers infusion    Intravenous Continuous Evertt Chouinard, Lajuan Lines, MD 10 mL/hr at 12/19/21 0814 New Bag at 12/19/21 0814    Allergies as of 11/01/2021 - Review Complete 10/24/2021  Allergen Reaction Noted   Procainamide hcl Other (See Comments) 04/23/2008   Quinidine Other (See Comments) 01/09/2013    Family History  Problem Relation Age of Onset   Arrhythmia Mother    Atrial fibrillation Mother    Cancer Father    Lung cancer Other    Prostate cancer Other    Colon cancer Paternal Uncle    Rectal cancer Neg Hx    Stomach cancer Neg Hx     Social History   Socioeconomic History   Marital status: Divorced    Spouse name: Not on file   Number of children: Not on file   Years of education: Not on file   Highest education level: Not on file  Occupational History   Not on file  Tobacco Use   Smoking status: Never   Smokeless tobacco: Never  Vaping Use   Vaping Use: Never used  Substance and Sexual Activity   Alcohol use: Yes    Alcohol/week: 8.0 standard drinks    Types: 4 Glasses of wine, 4 Shots of liquor per week    Comment: Social alcohol use   Drug use: No   Sexual activity: Not on file  Other Topics Concern   Not on file  Social History Narrative   Regular Exercise -  YES   Social Determinants of Health   Financial Resource Strain: Low Risk    Difficulty of Paying Living Expenses: Not hard at all  Food Insecurity: No Food Insecurity   Worried About Charity fundraiser in the Last Year: Never true   Ran Out of Food in the Last Year: Never true  Transportation Needs: No Transportation Needs   Lack of Transportation (Medical): No   Lack of Transportation (Non-Medical): No  Physical Activity: Sufficiently Active   Days of Exercise per Week: 5 days   Minutes of Exercise per Session: 30 min  Stress: No Stress Concern Present   Feeling of Stress : Not at all  Social Connections: Socially Integrated   Frequency of Communication with Friends and Family: More than three times a week    Frequency of Social Gatherings with Friends and Family: More than three times a week   Attends Religious Services: More than 4 times per year   Active Member of [employer] or Organizations: Yes   Attends Music therapist: More than 4 times per year   Marital Status: Living with partner  Intimate Partner Violence: Not At Risk   Fear of Current or Ex-Partner: No   Emotionally Abused: No   Physically Abused: No   Sexually Abused: No    Physical Exam: Vital signs in last 24 hours: @  BP (!) 194/109    Pulse 61    Temp (!) 97.2 F (36.2 C) (Temporal)    Resp 14    Ht 5\' 8"  (1.727 m)    Wt 78 kg    SpO2 100%    BMI 26.15 kg/m  GEN: NAD EYE: Sclerae anicteric ENT: MMM CV: Non-tachycardic Pulm: CTA b/l GI: Soft, NT/ND NEURO:  Alert & Oriented x 3   Zenovia Jarred, MD [facility]  12/19/2021 8:38 AM

## 2021-12-19 NOTE — Discharge Instructions (Addendum)
HOLD ELIQUIS AN ADDITIONAL 69 HOURS GIVEN POLYPS REMOVED TODAY.  You can resume this medication at previous dose on 12/22/2021.  Please monitor for any blood with stool and notify my office if this occurs.   YOU HAD AN ENDOSCOPIC PROCEDURE TODAY: Refer to the procedure report and other information in the discharge instructions given to you for any specific questions about what was found during the examination. If this information does not answer your questions, please call [place] office at [phone] to clarify.   YOU SHOULD EXPECT: Some feelings of bloating in the abdomen. Passage of more gas than usual. Walking can help get rid of the air that was put into your GI tract during the procedure and reduce the bloating. If you had a lower endoscopy (such as a colonoscopy or flexible sigmoidoscopy) you may notice spotting of blood in your stool or on the toilet paper. Some abdominal soreness may be present for a day or two, also.  DIET: Your first meal following the procedure should be a light meal and then it is ok to progress to your normal diet. A half-sandwich or bowl of soup is an example of a good first meal. Heavy or fried foods are harder to digest and may make you feel nauseous or bloated. Drink plenty of fluids but you should avoid alcoholic beverages for 24 hours. If you had a esophageal dilation, please see attached instructions for diet.    ACTIVITY: Your care partner should take you home directly after the procedure. You should plan to take it easy, moving slowly for the rest of the day. You can resume normal activity the day after the procedure however YOU SHOULD NOT DRIVE, use power tools, machinery or perform tasks that involve climbing or major physical exertion for 24 hours (because of the sedation medicines used during the test).   SYMPTOMS TO REPORT IMMEDIATELY: A gastroenterologist can be reached at any hour. Please call [phone]  for any of the following symptoms:  Following  lower endoscopy (colonoscopy, flexible sigmoidoscopy) Excessive amounts of blood in the stool  Significant tenderness, worsening of abdominal pains  Swelling of the abdomen that is new, acute  Fever of 100 or higher   FOLLOW UP:  If any biopsies were taken you will be contacted by phone or by letter within the next 1-3 weeks. Call [phone]  if you have not heard about the biopsies in 3 weeks.  Please also call with any specific questions about appointments or follow up tests.

## 2021-12-19 NOTE — Anesthesia Postprocedure Evaluation (Signed)
Anesthesia Post Note  Patient: Evan Farmer  Procedure(s) Performed: COLONOSCOPY WITH PROPOFOL ENDOSCOPIC MUCOSAL RESECTION POLYPECTOMY SUBMUCOSAL LIFTING INJECTION HEMOSTASIS CLIP PLACEMENT SUBMUCOSAL TATTOO INJECTION     Patient location during evaluation: PACU Anesthesia Type: MAC Level of consciousness: awake and alert Pain management: pain level controlled Vital Signs Assessment: post-procedure vital signs reviewed and stable Respiratory status: spontaneous breathing, nonlabored ventilation, respiratory function stable and patient connected to nasal cannula oxygen Cardiovascular status: stable and blood pressure returned to baseline Postop Assessment: no apparent nausea or vomiting Anesthetic complications: no   No notable events documented.  Last Vitals:  Vitals:   12/19/21 1000 12/19/21 1010  BP: 123/82 (!) 122/55  Pulse: 68 61  Resp: (!) 21 13  Temp:    SpO2: 94% 95%    Last Pain:  Vitals:   12/19/21 1010  TempSrc:   PainSc: 0-No pain                 Tiajuana Amass

## 2021-12-19 NOTE — Op Note (Signed)
[facility] Patient Name: Evan Farmer Procedure Date: 12/19/2021 MRN: [id number] Attending MD: Jerene Bears , MD Date of Birth: September 25, 1951 CSN: [id number] Age: 71 Admit Type: Outpatient Procedure:                Colonoscopy Indications:              For therapy of adenomatous polyp in the colon (seen                            recently at surveillance colonoscopy in Dec 2022),                            planned EMR; personal history of multiple                            adenomatous colonic polyps Providers:                Lajuan Lines. Hilarie Fredrickson, MD, Doristine Johns, RN, Cherylynn Ridges, Technician, Maudry Diego, CRNA Referring MD:             Evie Lacks. Plotnikov MD, MD Medicines:                Monitored Anesthesia Care Complications:            No immediate complications. Estimated Blood Loss:     Estimated blood loss was minimal. Procedure:                Pre-Anesthesia Assessment:                           - Prior to the procedure, a History and Physical                            was performed, and patient medications and                            allergies were reviewed. The patient's tolerance of                            previous anesthesia was also reviewed. The risks                            and benefits of the procedure and the sedation                            options and risks were discussed with the patient.                            All questions were answered, and informed consent                            was obtained. Prior Anticoagulants: The patient has  taken Eliquis (apixaban), last dose was 3 days                            prior to procedure. ASA Grade Assessment: III - A                            patient with severe systemic disease. After                            reviewing the risks and benefits, the patient was                            deemed in satisfactory condition to undergo the                             procedure.                           After obtaining informed consent, the colonoscope                            was passed under direct vision. Throughout the                            procedure, the patient's blood pressure, pulse, and                            oxygen saturations were monitored continuously. The                            CF-HQ190L ([id number]) Olympus colonoscope was                            introduced through the anus and advanced to the                            cecum, identified by appendiceal orifice and                            ileocecal valve. The colonoscopy was performed                            without difficulty. The patient tolerated the                            procedure well. The quality of the bowel                            preparation was good. The ileocecal valve,                            appendiceal orifice, and rectum were photographed. Scope In: 8:56:21 AM Scope Out: 9:29:31 AM Scope Withdrawal Time: 0 hours 30 minutes 42 seconds  Total Procedure Duration: 0 hours 33 minutes 10  seconds  Findings:      The digital rectal exam was normal.      A tattoo was seen in the proximal ascending colon. A post-polypectomy       scar was found at the tattoo site. There was no evidence of residual       polyp tissue.      A 25 mm polyp was found in the distal ascending colon near hepatic       flexure. The polyp was sessile. Preparations were made for mucosal       resection. Everlift 3 cc was injected with adequate lift of the lesion       from the muscularis propria. Piecemeal cold snare mucosal resection with       suction (via the working channel) retrieval was performed. A 30 mm area       was resected. Resection and retrieval were complete. Bleeding had       stopped at the end of the procedure. To reduce the risk additional       bleeding after the polypectomy and with need for anticoagulation, five        hemostatic clips were successfully placed (MR conditional) to close the       polypectomy defect. Area immediately distal to the resection margin was       tattooed with an injection of 4 mL of Spot (carbon black).      A 3 mm polyp was found in the distal sigmoid colon. The polyp was       sessile. The polyp was removed with a cold snare. Resection and       retrieval were complete.      Multiple small and large-mouthed diverticula were found in the sigmoid       colon, descending colon and ascending colon.      Internal hemorrhoids were found during retroflexion. The hemorrhoids       were small. Impression:               - A tattoo was seen in the proximal ascending                            colon. A post-polypectomy scar was found at the                            tattoo site. There was no evidence of residual                            polyp tissue.                           - One 25 mm polyp in the distal ascending colon,                            removed with mucosal resection. Resected and                            retrieved. Clips (MR conditional) were placed.                            Tattooed.                           -  One 3 mm polyp in the distal sigmoid colon,                            removed with a cold snare. Resected and retrieved.                           - Diverticulosis in the sigmoid colon, in the                            descending colon and in the ascending colon.                           - Internal hemorrhoids.                           - Mucosal resection was performed. Resection and                            retrieval were complete. Moderate Sedation:      N/A Recommendation:           - Patient has a contact number available for                            emergencies. The signs and symptoms of potential                            delayed complications were discussed with the                            patient. Return to normal activities tomorrow.                             Written discharge instructions were provided to the                            patient.                           - Resume previous diet.                           - Continue present medications.                           - Resume Eliquis (apixaban) at prior dose in 3                            days. Refer to managing physician for further                            adjustment of therapy.                           - Await pathology results.                           -  No aspirin, ibuprofen, naproxen, or other                            non-steroidal anti-inflammatory drugs for 2 weeks                            after polyp removal.                           - Repeat colonoscopy in 6 months for surveillance                            after piecemeal polypectomy. Procedure Code(s):        --- Professional ---                           [phone], Colonoscopy, flexible; with endoscopic                            mucosal resection                           45385, 68, Colonoscopy, flexible; with removal of                            tumor(s), polyp(s), or other lesion(s) by snare                            technique Diagnosis Code(s):        --- Professional ---                           K64.8, Other hemorrhoids                           K63.5, Polyp of colon                           D12.6, Benign neoplasm of colon, unspecified                           K57.30, Diverticulosis of large intestine without                            perforation or abscess without bleeding CPT copyright 2019 American Medical Association. All rights reserved. The codes documented in this report are preliminary and upon coder review may  be revised to meet current compliance requirements. Jerene Bears, MD 12/19/2021 10:04:03 AM This report has been signed electronically. Number of Addenda: 0

## 2021-12-20 ENCOUNTER — Encounter (HOSPITAL_COMMUNITY): Payer: Self-pay | Admitting: Internal Medicine

## 2021-12-20 LAB — SURGICAL PATHOLOGY

## 2021-12-21 ENCOUNTER — Other Ambulatory Visit: Payer: Self-pay

## 2021-12-21 ENCOUNTER — Encounter: Payer: Self-pay | Admitting: Internal Medicine

## 2022-01-02 ENCOUNTER — Encounter: Payer: Self-pay | Admitting: Internal Medicine

## 2022-01-02 ENCOUNTER — Ambulatory Visit (INDEPENDENT_AMBULATORY_CARE_PROVIDER_SITE_OTHER): Payer: 59 | Admitting: Internal Medicine

## 2022-01-02 ENCOUNTER — Other Ambulatory Visit: Payer: Self-pay

## 2022-01-02 VITALS — BP 124/80 | HR 60 | Temp 98.0°F | Resp 18 | Ht 68.0 in | Wt 183.8 lb

## 2022-01-02 DIAGNOSIS — Z0000 Encounter for general adult medical examination without abnormal findings: Secondary | ICD-10-CM

## 2022-01-02 DIAGNOSIS — I480 Paroxysmal atrial fibrillation: Secondary | ICD-10-CM

## 2022-01-02 LAB — URINALYSIS
Bilirubin Urine: NEGATIVE
Ketones, ur: NEGATIVE
Leukocytes,Ua: NEGATIVE
Nitrite: NEGATIVE
Specific Gravity, Urine: <=1.005 — AB (ref 1.000–1.030)
Total Protein, Urine: NEGATIVE
Urine Glucose: NEGATIVE
Urobilinogen, UA: 0.2 (ref 0.0–1.0)
pH: 6.5 (ref 5.0–8.0)

## 2022-01-02 LAB — LIPID PANEL
Cholesterol: 219 mg/dL — ABNORMAL HIGH (ref 0–200)
HDL: 53.1 mg/dL (ref >39.00)
LDL Cholesterol: 143 mg/dL — ABNORMAL HIGH (ref 0–99)
NonHDL: 165.95
Total CHOL/HDL Ratio: 4
Triglycerides: 114 mg/dL (ref 0.0–149.0)
VLDL: 22.8 mg/dL (ref 0.0–40.0)

## 2022-01-02 LAB — COMPREHENSIVE METABOLIC PANEL
ALT: 13 U/L (ref 0–53)
AST: 17 U/L (ref 0–37)
Albumin: 4 g/dL (ref 3.5–5.2)
Alkaline Phosphatase: 59 U/L (ref 39–117)
BUN: 16 mg/dL (ref 6–23)
CO2: 33 mEq/L — ABNORMAL HIGH (ref 19–32)
Calcium: 8.9 mg/dL (ref 8.4–10.5)
Chloride: 102 mEq/L (ref 96–112)
Creatinine, Ser: 1.09 mg/dL (ref 0.40–1.50)
GFR: 69.47 mL/min (ref >60.00)
Glucose, Bld: 98 mg/dL (ref 70–99)
Potassium: 4.3 mEq/L (ref 3.5–5.1)
Sodium: 138 mEq/L (ref 135–145)
Total Bilirubin: 1.1 mg/dL (ref 0.2–1.2)
Total Protein: 6.5 g/dL (ref 6.0–8.3)

## 2022-01-02 LAB — CBC WITH DIFFERENTIAL/PLATELET
Basophils Absolute: 0 10*3/uL (ref 0.0–0.1)
Basophils Relative: 0.7 % (ref 0.0–3.0)
Eosinophils Absolute: 0 10*3/uL (ref 0.0–0.7)
Eosinophils Relative: 1.3 % (ref 0.0–5.0)
HCT: 45.6 % (ref 39.0–52.0)
Hemoglobin: 15.4 g/dL (ref 13.0–17.0)
Lymphocytes Relative: 18 % (ref 12.0–46.0)
Lymphs Abs: 0.6 10*3/uL — ABNORMAL LOW (ref 0.7–4.0)
MCHC: 33.8 g/dL (ref 30.0–36.0)
MCV: 98.6 fl (ref 78.0–100.0)
Monocytes Absolute: 0.3 10*3/uL (ref 0.1–1.0)
Monocytes Relative: 7.8 % (ref 3.0–12.0)
Neutro Abs: 2.4 10*3/uL (ref 1.4–7.7)
Neutrophils Relative %: 72.2 % (ref 43.0–77.0)
Platelets: 140 10*3/uL — ABNORMAL LOW (ref 150.0–400.0)
RBC: 4.63 Mil/uL (ref 4.22–5.81)
RDW: 13.4 % (ref 11.5–15.5)
WBC: 3.4 10*3/uL — ABNORMAL LOW (ref 4.0–10.5)

## 2022-01-02 LAB — TSH: TSH: 1.47 u[IU]/mL (ref 0.35–5.50)

## 2022-01-02 NOTE — Progress Notes (Signed)
Subjective:  Patient ID: Evan Farmer, male    DOB: [birth year]  Age: 71 y.o. MRN: [id number]  CC: Annual Exam (No concerns. )   HPI Evan Farmer presents for a well exam  Outpatient Medications Prior to Visit  Medication Sig Dispense Refill   ALPRAZolam (XANAX) 0.5 MG tablet TAKE 1 TABLET TWICE A DAY  AS NEEDED FOR ANXIETY 180 tablet 1   atenolol (TENORMIN) 50 MG tablet TAKE 1 TABLET DAILY 90 tablet 0   Cholecalciferol (VITAMIN D3) 50 MCG (2000 UT) capsule Take 1 capsule (2,000 Units total) by mouth daily. 100 capsule 3   ELIQUIS 5 MG TABS tablet TAKE 1 TABLET TWICE A DAY 180 tablet 1   flecainide (TAMBOCOR) 100 MG tablet TAKE 1 TABLET TWICE A DAY 180 tablet 0   losartan (COZAAR) 25 MG tablet TAKE 1 TABLET DAILY 90 tablet 3   Multiple Vitamins-Minerals (MULTIVITAMIN WITH MINERALS) tablet Take 1 tablet by mouth daily.     sildenafil (VIAGRA) 100 MG tablet Take 1 tablet (100 mg total) by mouth daily as needed for erectile dysfunction. 12 tablet 5   No facility-administered medications prior to visit.    ROS: Review of Systems  Constitutional:  Positive for unexpected weight change. Negative for appetite change and fatigue.  HENT:  Negative for congestion, nosebleeds, sneezing, sore throat and trouble swallowing.   Eyes:  Negative for itching and visual disturbance.  Respiratory:  Negative for cough.   Cardiovascular:  Negative for chest pain, palpitations and leg swelling.  Gastrointestinal:  Negative for abdominal distention, blood in stool, diarrhea and nausea.  Genitourinary:  Negative for frequency and hematuria.  Musculoskeletal:  Negative for back pain, gait problem, joint swelling and neck pain.  Skin:  Negative for rash.  Neurological:  Negative for dizziness, tremors, speech difficulty and weakness.  Psychiatric/Behavioral:  Negative for agitation, dysphoric mood and sleep disturbance. The patient is not nervous/anxious.    Objective:  BP 124/80    Pulse 60    Temp  98 F (36.7 C) (Oral)    Resp 18    Ht 5\' 8"  (1.727 m)    Wt 183 lb 12.8 oz (83.4 kg)    SpO2 96%    BMI 27.95 kg/m   BP Readings from Last 3 Encounters:  01/02/22 124/80  12/19/21 (!) 122/55  10/24/21 128/63    Wt Readings from Last 3 Encounters:  01/02/22 183 lb 12.8 oz (83.4 kg)  12/19/21 172 lb (78 kg)  10/24/21 178 lb (80.7 kg)    Physical Exam Constitutional:      General: He is not in acute distress.    Appearance: He is well-developed.     Comments: NAD  Eyes:     Conjunctiva/sclera: Conjunctivae normal.     Pupils: Pupils are equal, round, and reactive to light.  Neck:     Thyroid: No thyromegaly.     Vascular: No JVD.  Cardiovascular:     Rate and Rhythm: Normal rate and regular rhythm.     Heart sounds: Normal heart sounds. No murmur heard.   No friction rub. No gallop.  Pulmonary:     Effort: Pulmonary effort is normal. No respiratory distress.     Breath sounds: Normal breath sounds. No wheezing or rales.  Chest:     Chest wall: No tenderness.  Abdominal:     General: Bowel sounds are normal. There is no distension.     Palpations: Abdomen is soft. There is no mass.  Tenderness: There is no abdominal tenderness. There is no guarding or rebound.  Musculoskeletal:        General: No tenderness. Normal range of motion.     Cervical back: Normal range of motion.  Lymphadenopathy:     Cervical: No cervical adenopathy.  Skin:    General: Skin is warm and dry.     Findings: No rash.  Neurological:     Mental Status: He is alert and oriented to person, place, and time.     Cranial Nerves: No cranial nerve deficit.     Motor: No abnormal muscle tone.     Coordination: Coordination normal.     Gait: Gait normal.     Deep Tendon Reflexes: Reflexes are normal and symmetric.  Psychiatric:        Behavior: Behavior normal.        Thought Content: Thought content normal.        Judgment: Judgment normal.    Lab Results  Component Value Date   WBC 3.7  (L) 06/20/2021   HGB 16.0 06/20/2021   HCT 47.6 06/20/2021   PLT 136.0 (L) 06/20/2021   GLUCOSE 93 06/20/2021   CHOL 203 (H) 06/20/2021   TRIG 84.0 06/20/2021   HDL 52.10 06/20/2021   LDLCALC 134 (H) 06/20/2021   ALT 11 06/20/2021   AST 16 06/20/2021   NA 135 06/20/2021   K 4.3 06/20/2021   CL 100 06/20/2021   CREATININE 1.14 06/20/2021   BUN 20 06/20/2021   CO2 29 06/20/2021   TSH 0.71 06/20/2021   PSA <0.1 06/20/2020   INR 0.95 02/12/2011   HGBA1C  01/20/2011    5.2 (NOTE)                                                                       According to the ADA Clinical Practice Recommendations for 2011, when HbA1c is used as a screening test:   >=6.5%   Diagnostic of Diabetes Mellitus           (if abnormal result  is confirmed)  5.7-6.4%   Increased risk of developing Diabetes Mellitus  References:Diagnosis and Classification of Diabetes Mellitus,Diabetes VCBS,4967,59(FMBWG 1):S62-S69 and Standards of Medical Care in         Diabetes - 2011,Diabetes Care,2011,34  (Suppl 1):S11-S61.    No results found.  Assessment & Plan:   Problem List Items Addressed This Visit     Atrial fibrillation ([place])    Cont on Eliquis, Flecainide        Well adult exam - Primary    We discussed age appropriate health related issues, including available/recomended screening tests and vaccinations. We discussed a need for adhering to healthy diet and exercise. Labs were ordered to be later reviewed . All questions were answered. Last colon 2/23; Colon due 2024 - polyps f/u      Relevant Orders   TSH   Urinalysis   CBC with Differential/Platelet   Lipid panel   Comprehensive metabolic panel      No orders of the defined types were placed in this encounter.     Follow-up: Return in about 6 months (around 07/02/2022) for a follow-up visit.  Walker Kehr, MD

## 2022-01-02 NOTE — Patient Instructions (Signed)
[place], [place]

## 2022-01-02 NOTE — Assessment & Plan Note (Signed)
Cont on Eliquis, Flecainide   

## 2022-01-02 NOTE — Assessment & Plan Note (Signed)
We discussed age appropriate health related issues, including available/recomended screening tests and vaccinations. We discussed a need for adhering to healthy diet and exercise. Labs were ordered to be later reviewed . All questions were answered. Last colon 2/23; Colon due 2024 - polyps f/u

## 2022-01-04 ENCOUNTER — Ambulatory Visit (HOSPITAL_BASED_OUTPATIENT_CLINIC_OR_DEPARTMENT_OTHER): Payer: 59 | Admitting: Internal Medicine

## 2022-01-04 ENCOUNTER — Other Ambulatory Visit: Payer: Self-pay

## 2022-01-04 ENCOUNTER — Encounter (HOSPITAL_BASED_OUTPATIENT_CLINIC_OR_DEPARTMENT_OTHER): Payer: Self-pay | Admitting: Internal Medicine

## 2022-01-04 VITALS — BP 152/82 | HR 57 | Ht 68.0 in | Wt 185.0 lb

## 2022-01-04 DIAGNOSIS — I480 Paroxysmal atrial fibrillation: Secondary | ICD-10-CM

## 2022-01-04 DIAGNOSIS — I10 Essential (primary) hypertension: Secondary | ICD-10-CM | POA: Diagnosis not present

## 2022-01-04 DIAGNOSIS — E785 Hyperlipidemia, unspecified: Secondary | ICD-10-CM

## 2022-01-04 MED ORDER — ATENOLOL 25 MG PO TABS: 25.0000 mg | ORAL_TABLET | Freq: Every day | ORAL | 3 refills | Status: DC

## 2022-01-04 NOTE — Patient Instructions (Addendum)
Medication Instructions:  Your physician has recommended you make the following change in your medication:   1.  REDUCE your atenolol -- Take 25 mg one tablet by mouth daily  Labwork: None ordered.  Testing/Procedures: None ordered.  Follow-Up: Your physician wants you to follow-up in: one year with Dr. Vallery Ridge will receive a reminder letter in the mail two months in advance. If you don't receive a letter, please call our office to schedule the follow-up appointment.   Any Other Special Instructions Will Be Listed Below (If Applicable).  If you need a refill on your cardiac medications before your next appointment, please call your pharmacy.

## 2022-01-04 NOTE — Progress Notes (Signed)
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 male who presents today for routine electrophysiology followup.  Since last being seen in our clinic, the patient reports doing very well.  Today, he denies symptoms of palpitations, chest pain, shortness of breath,  lower extremity edema, dizziness, presyncope, or syncope.  The patient is otherwise without complaint today.   Past Medical History:  Diagnosis Date   Anxiety    Cataract    Depression    Diverticulosis    HTN (hypertension)    Hyperlipidemia    Paroxysmal A-fib ([place])    Prostate cancer [facility])    2010 Dr Terance Hart    [facility] 2 spotted fever    age 37   S/P cardiac cath 02/2011   No obstructive disease. Normal LV function   Scoliosis    TIA (transient ischemic attack)    2012   Tubular adenoma of colon    Vitamin B 12 deficiency    Vitamin D deficiency    Weight gain    Past Surgical History:  Procedure Laterality Date   CARDIOVERSION N/A 07/10/2016   Procedure: CARDIOVERSION;  Surgeon: Lelon Perla, MD;  Location: [place];  Service: Cardiovascular;  Laterality: N/A;   CATARACT EXTRACTION W/ INTRAOCULAR LENS  IMPLANT, BILATERAL     COLONOSCOPY  01-24-2005   TICS ONLY    COLONOSCOPY WITH PROPOFOL N/A 12/19/2021   Procedure: COLONOSCOPY WITH PROPOFOL;  Surgeon: Jerene Bears, MD;  Location: [facility];  Service: Gastroenterology;  Laterality: N/A;   ENDOSCOPIC MUCOSAL RESECTION N/A 12/19/2021   Procedure: ENDOSCOPIC MUCOSAL RESECTION;  Surgeon: Jerene Bears, MD;  Location: [facility];  Service: Gastroenterology;  Laterality: N/A;   HEMOSTASIS CLIP PLACEMENT  12/19/2021   Procedure: HEMOSTASIS CLIP PLACEMENT;  Surgeon: Jerene Bears, MD;  Location: [facility];  Service: Gastroenterology;;   [place] and 2003   twice   NASAL SEPTUM SURGERY     POLYPECTOMY  12/19/2021   Procedure: POLYPECTOMY;  Surgeon: Jerene Bears, MD;  Location: [facility]  ENDOS[facility];  Service: Gastroenterology;;   PROSTATECTOMY  11/2008   Robotic    SUBMUCOSAL LIFTING INJECTION  12/19/2021   Procedure: SUBMUCOSAL LIFTING INJECTION;  Surgeon: Jerene Bears, MD;  Location: [facility];  Service: Gastroenterology;;   SUBMUCOSAL TATTOO INJECTION  12/19/2021   Procedure: SUBMUCOSAL TATTOO INJECTION;  Surgeon: Jerene Bears, MD;  Location: [facility];  Service: Gastroenterology;;   TEE WITHOUT CARDIOVERSION N/A 07/10/2016   Procedure: TRANSESOPHAGEAL ECHOCARDIOGRAM (TEE);  Surgeon: Lelon Perla, MD;  Location[facility]COPY;  Service: Cardiovascular;  Laterality: N/A;    ROS- all systems are reviewed and negatives except as per HPI above  Current Outpatient Medications  Medication Sig Dispense Refill   ALPRAZolam (XANAX) 0.5 MG tablet TAKE 1 TABLET TWICE A DAY  AS NEEDED FOR ANXIETY 180 tablet 1   atenolol (TENORMIN) 50 MG tablet TAKE 1 TABLET DAILY 90 tablet 0   Cholecalciferol (VITAMIN D3) 50 MCG (2000 UT) capsule Take 1 capsule (2,000 Units total) by mouth daily. 100 capsule 3   ELIQUIS 5 MG TABS tablet TAKE 1 TABLET TWICE A DAY 180 tablet 1   flecainide (TAMBOCOR) 100 MG tablet TAKE 1 TABLET TWICE A DAY 180 tablet 0   losartan (COZAAR) 25 MG tablet TAKE 1 TABLET DAILY 90 tablet 3   Multiple Vitamins-Minerals (MULTIVITAMIN WITH MINERALS) tablet Take 1  tablet by mouth daily.     sildenafil (VIAGRA) 100 MG tablet Take 1 tablet (100 mg total) by mouth daily as needed for erectile dysfunction. 12 tablet 5   No current facility-administered medications for this visit.    Physical Exam: Vitals:   01/04/22 1618  BP: (!) 152/82  Pulse: (!) 57  SpO2: 95%  Weight: 185 lb (83.9 kg)  Height: 5\' 8"  (1.727 m)    GEN- The patient is well appearing, alert and oriented x 3 today.   Head- normocephalic, atraumatic Eyes-  Sclera clear, conjunctiva pink Ears- hearing intact Oropharynx- clear Lungs- Clear to ausculation bilaterally, normal work of breathing Heart-  Regular rate and rhythm, no murmurs, rubs or gallops, PMI not laterally displaced GI- soft, NT, ND, + BS Extremities- no clubbing, cyanosis, or edema  Wt Readings from Last 3 Encounters:  01/04/22 185 lb (83.9 kg)  01/02/22 183 lb 12.8 oz (83.4 kg)  12/19/21 172 lb (78 kg)    EKG tracing ordered today is personally reviewed and shows sinus rhythm 57 bpm, PR 202 msec  Assessment and Plan:  Paroxysmal atrial fibrillation Well controlled with flecainide Chads2vasc score is 2.  He is on eliquis  2. HTN Stable No change required today  3. HL Stable No change required today   Risks, benefits and potential toxicities for medications prescribed and/or refilled reviewed with patient today.   Return in a year  Thompson Grayer MD, [place] 01/04/2022 4:30 PM

## 2022-01-26 ENCOUNTER — Other Ambulatory Visit: Payer: Self-pay

## 2022-01-26 ENCOUNTER — Ambulatory Visit (HOSPITAL_COMMUNITY)
Admission: EM | Admit: 2022-01-26 | Discharge: 2022-01-26 | Disposition: A | Discharge status: Home / Self Care | Payer: 59 | Attending: Emergency Medicine | Admitting: Emergency Medicine

## 2022-01-26 ENCOUNTER — Encounter (HOSPITAL_COMMUNITY): Payer: Self-pay | Admitting: Emergency Medicine

## 2022-01-26 DIAGNOSIS — J029 Acute pharyngitis, unspecified: Secondary | ICD-10-CM | POA: Diagnosis present

## 2022-01-26 LAB — POCT RAPID STREP A, ED / UC: Streptococcus, Group A Screen (Direct): NEGATIVE

## 2022-01-26 MED ORDER — LIDOCAINE VISCOUS HCL 2 % MT SOLN: 15.0000 mL | OROMUCOSAL | 0 refills | Status: DC | PRN

## 2022-01-26 NOTE — ED Provider Notes (Signed)
[facility]    CSN: [id number] Arrival date & time: 01/26/22  1301      History   Chief Complaint Chief Complaint  Patient presents with   Sore Throat    HPI Evan Farmer is a 71 y.o. male.   Patient presents with a sore throat for 4 days.  subjective fever 3 days ago but has resolved.  Painful to swallow but able to tolerate food and liquids.  Endorses that he took a pill today at work and felt like it became lodged but symptoms have improved over the course of the day.  Denies chills or body aches, nasal congestion, rhinorrhea, ear pain, coughing, headache, shortness of breath, wheezing.  No known sick contacts.  Past Medical History:  Diagnosis Date   Anxiety    Cataract    Depression    Diverticulosis    HTN (hypertension)    Hyperlipidemia    Paroxysmal A-fib (HCC)    Prostate cancer [facility])    2010 [name]    [facility] spotted fever    age 59   S/P cardiac cath 02/2011   No obstructive disease. Normal LV function   Scoliosis    TIA (transient ischemic attack)    2012   Tubular adenoma of colon    Vitamin B 12 deficiency    Vitamin D deficiency    Weight gain     Patient Active Problem List   Diagnosis Date Noted   History of colonic polyps    Benign neoplasm of ascending colon    Benign neoplasm of sigmoid colon    Forearm pain 12/22/2019   Well adult exam 01/19/2014   TIA (transient ischemic attack) 02/02/2011   HTN (hypertension)    Paroxysmal A-fib (HCC)    Weight gain    Vitamin D deficiency 03/30/2010   Anxiety state 02/13/2010   PALPITATIONS 02/10/2010   BACTERIAL PNEUMONIA 01/14/2010   B12 deficiency 08/19/2009   HOARSENESS 08/15/2009   CBC, ABNORMAL 08/15/2009   PROSTATE CANCER, HX OF 08/15/2009   DIARRHEA OF PRESUMED INFECTIOUS ORIGIN 07/10/2008   RUPTURE, QUADRICEPS TENDON 03/02/2008   Dyslipidemia 10/24/2007   DEPRESSION 10/24/2007   Atrial fibrillation (HCC) 10/24/2007   PROSTATITIS, RECURRENT 10/24/2007    ERECTILE DYSFUNCTION 10/24/2007   ELEVATED PROSTATE SPECIFIC ANTIGEN 10/24/2007    Past Surgical History:  Procedure Laterality Date   CARDIOVERSION N/A 07/10/2016   Procedure: CARDIOVERSION;  Surgeon: Lewayne Bunting, MD;  Location: [facility] ENDOSCOPY;  Service: Cardiovascular;  Laterality: N/A;   CATARACT EXTRACTION W/ INTRAOCULAR LENS  IMPLANT, BILATERAL     COLONOSCOPY  01-24-2005   TICS ONLY    COLONOSCOPY WITH PROPOFOL N/A 12/19/2021   Procedure: COLONOSCOPY WITH PROPOFOL;  Surgeon: Beverley Fiedler, MD;  Location: [facility] ENDOSCOPY;  Service: Gastroenterology;  Laterality: N/A;   ENDOSCOPIC MUCOSAL RESECTION N/A 12/19/2021   Procedure: ENDOSCOPIC MUCOSAL RESECTION;  Surgeon: Beverley Fiedler, MD;  Location: [facility] ENDOSCOPY;  Service: Gastroenterology;  Laterality: N/A;   HEMOSTASIS CLIP PLACEMENT  12/19/2021   Procedure: HEMOSTASIS CLIP PLACEMENT;  Surgeon: Beverley Fiedler, MD;  Location: [facility] ENDOSCOPY;  Service: Gastroenterology;;   HERNIA REPAIR     HERNIA REPAIR  1995 and 2003   twice   NASAL SEPTUM SURGERY     POLYPECTOMY  12/19/2021   Procedure: POLYPECTOMY;  Surgeon: Beverley Fiedler, MD;  Location: [facility] ENDOSCOPY;  Service: Gastroenterology;;   PROSTATECTOMY  11/2008   Robotic    SUBMUCOSAL LIFTING INJECTION  12/19/2021  Procedure: SUBMUCOSAL LIFTING INJECTION;  Surgeon: Beverley Fiedler, MD;  Location: [facility] ENDOSCOPY;  Service: Gastroenterology;;   SUBMUCOSAL TATTOO INJECTION  12/19/2021   Procedure: SUBMUCOSAL TATTOO INJECTION;  Surgeon: Beverley Fiedler, MD;  Location: [facility] ENDOSCOPY;  Service: Gastroenterology;;   TEE WITHOUT CARDIOVERSION N/A 07/10/2016   Procedure: TRANSESOPHAGEAL ECHOCARDIOGRAM (TEE);  Surgeon: Lewayne Bunting, MD;  Location: [facility] ENDOSCOPY;  Service: Cardiovascular;  Laterality: N/A;       Home Medications    Prior to Admission medications   Medication Sig Start Date End Date Taking? Authorizing Provider  ALPRAZolam (XANAX) 0.5 MG tablet TAKE 1 TABLET TWICE A DAY  AS NEEDED FOR ANXIETY  11/26/21   Plotnikov, Georgina Quint, MD  atenolol (TENORMIN) 25 MG tablet Take 1 tablet (25 mg total) by mouth daily. 01/04/22 04/04/22  Allred, Fayrene Fearing, MD  Cholecalciferol (VITAMIN D3) 50 MCG (2000 UT) capsule Take 1 capsule (2,000 Units total) by mouth daily. 12/20/20   Plotnikov, Georgina Quint, MD  ELIQUIS 5 MG TABS tablet TAKE 1 TABLET TWICE A DAY 11/27/21   Allred, Fayrene Fearing, MD  flecainide (TAMBOCOR) 100 MG tablet TAKE 1 TABLET TWICE A DAY 11/27/21   Allred, Fayrene Fearing, MD  losartan (COZAAR) 25 MG tablet TAKE 1 TABLET DAILY 11/02/21   Allred, Fayrene Fearing, MD  Multiple Vitamins-Minerals (MULTIVITAMIN WITH MINERALS) tablet Take 1 tablet by mouth daily.    [provider]  sildenafil (VIAGRA) 100 MG tablet Take 1 tablet (100 mg total) by mouth daily as needed for erectile dysfunction. 12/20/20   Plotnikov, Georgina Quint, MD    Family History Family History  Problem Relation Age of Onset   Arrhythmia Mother    Atrial fibrillation Mother    Cancer Father    Lung cancer Other    Prostate cancer Other    Colon cancer Paternal Uncle    Rectal cancer Neg Hx    Stomach cancer Neg Hx     Social History Social History   Tobacco Use   Smoking status: Never   Smokeless tobacco: Never  Vaping Use   Vaping Use: Never used  Substance Use Topics   Alcohol use: Yes    Alcohol/week: 8.0 standard drinks    Types: 4 Glasses of wine, 4 Shots of liquor per week    Comment: Social alcohol use   Drug use: No     Allergies   Procainamide hcl and Quinidine   Review of Systems Review of Systems  Constitutional:  Positive for fever. Negative for activity change, appetite change, chills, diaphoresis, fatigue and unexpected weight change.  HENT: Negative.    Respiratory: Negative.    Cardiovascular: Negative.   Gastrointestinal: Negative.   Skin: Negative.   Neurological: Negative.     Physical Exam Triage Vital Signs ED Triage Vitals  Enc Vitals Group     BP 01/26/22 1328 (!) 205/96     Pulse Rate 01/26/22  1328 67     Resp 01/26/22 1328 16     Temp 01/26/22 1328 98.1 F (36.7 C)     Temp Source 01/26/22 1328 Oral     SpO2 01/26/22 1328 96 %     Weight --      Height --      Head Circumference --      Peak Flow --      Pain Score 01/26/22 1327 4     Pain Loc --      Pain Edu? --      Excl. in GC? --  No data found.  Updated Vital Signs BP (!) 205/96 (BP Location: Right Arm)   Pulse 67   Temp 98.1 F (36.7 C) (Oral)   Resp 16   SpO2 96%   Visual Acuity Right Eye Distance:   Left Eye Distance:   Bilateral Distance:    Right Eye Near:   Left Eye Near:    Bilateral Near:     Physical Exam Constitutional:      Appearance: He is well-developed.  HENT:     Head: Normocephalic.     Right Ear: Tympanic membrane and ear canal normal.     Left Ear: Tympanic membrane and ear canal normal.     Nose: No congestion or rhinorrhea.     Mouth/Throat:     Mouth: Mucous membranes are moist.     Pharynx: Posterior oropharyngeal erythema present.     Tonsils: No tonsillar exudate. 1+ on the right. 1+ on the left.  Cardiovascular:     Rate and Rhythm: Normal rate and regular rhythm.     Heart sounds: Normal heart sounds.  Pulmonary:     Effort: Pulmonary effort is normal.     Breath sounds: Normal breath sounds.  Musculoskeletal:     Cervical back: Normal range of motion and neck supple.  Skin:    General: Skin is warm and dry.  Neurological:     General: No focal deficit present.     Mental Status: He is alert and oriented to person, place, and time.  Psychiatric:        Mood and Affect: Mood normal.        Behavior: Behavior normal.     UC Treatments / Results  Labs (all labs ordered are listed, but only abnormal results are displayed) Labs Reviewed - No data to display  EKG   Radiology No results found.  Procedures Procedures (including critical care time)  Medications Ordered in UC Medications - No data to display  Initial Impression / Assessment and Plan  / UC Course  I have reviewed the triage vital signs and the nursing notes.  Pertinent labs & imaging results that were available during my care of the patient were reviewed by me and considered in my medical decision making (see chart for details).  Viral pharyngitis  Rapid strep negative, sent for culture, discussed findings with patient, viscous lidocaine prescribed for comfort, may attempt use of Tylenol, ibuprofen, salt water gargles, throat lozenges warm liquids and teaspoons of honey for additional support, urgent care follow-up as needed Final Clinical Impressions(s) / UC Diagnoses   Final diagnoses:  None   Discharge Instructions   None    ED Prescriptions   None    PDMP not reviewed this encounter.   Valinda Hoar, NP 01/26/22 1428

## 2022-01-26 NOTE — Discharge Instructions (Signed)
Your rapid strep test today was negative, your throat sample has been sent to the lab to see if it will grow bacteria, if this occurs you will be notified and antibiotic be sent in for use ? ?In the meantime we must treat this as a viral symptom and manage the symptoms ? ?You may gargle and spit lidocaine solution every 4 hours as needed for temporary relief of your sore throat ? ?May use ibuprofen every 8 hours as needed in addition to Tylenol for additional comfort ? ?May attempt use of salt water gargles, throat lozenges, warm liquids and teaspoons of honey for additional comfort ?You may follow-up at urgent care as needed  ?

## 2022-01-26 NOTE — ED Triage Notes (Signed)
Pt had sore throat for couple days. Reports today at work taking a pill and it got lodge. Reports that feels like something still stuck.  ?

## 2022-01-29 LAB — CULTURE, GROUP A STREP (THRC)

## 2022-02-16 ENCOUNTER — Other Ambulatory Visit: Payer: Self-pay | Admitting: Internal Medicine

## 2022-06-29 ENCOUNTER — Telehealth: Payer: Self-pay | Admitting: Internal Medicine

## 2022-06-29 NOTE — Telephone Encounter (Signed)
Pt c/o BP issue: STAT if pt c/o blurred vision, one-sided weakness or slurred speech  1. What are your last 5 BP readings? 159/90 145/87 158/91 159/89   2. Are you having any other symptoms (ex. Dizziness, headache, blurred vision, passed out)? No   3. What is your BP issue? Elevated BP and HR.Marland KitchenHR is in 80's; usually in mid 50's/early 60's

## 2022-06-29 NOTE — Telephone Encounter (Signed)
Spoke with pt who reports BP readings reported are all from today.  BP this AM 128/70 and as the day has increased.  Also reports BP cuff shows irregular rhythm 2-3 times today.  Denies HA and palpitations.  Had fried squash last night and sandwich meat today.  Advised that these food choices could be contributing to elevated BP readings.   HR in the 80-90's today which pt expresses is high for him.  Advised pt to take eliquis and atenolol as ordered.  Also advised pt that HR of 80-90's is not of concern.  If pt becomes symptomatic with palpitations, dizziness or lightheaded and HR sustain 120's or above to notify the office.  Advised to monitor BP and HR over the weekend and call in or my chart readings next week for review.  Expresses understanding.

## 2022-07-02 ENCOUNTER — Telehealth: Payer: Self-pay | Admitting: Internal Medicine

## 2022-07-02 ENCOUNTER — Encounter: Payer: Self-pay | Admitting: Internal Medicine

## 2022-07-02 NOTE — Telephone Encounter (Signed)
LVM for pt to rtn my call to schedule AWV with [facility] call back # [phone] 

## 2022-07-03 ENCOUNTER — Encounter: Payer: Self-pay | Admitting: Internal Medicine

## 2022-07-03 ENCOUNTER — Ambulatory Visit: Payer: 59 | Admitting: Internal Medicine

## 2022-07-03 VITALS — BP 128/70 | HR 83 | Temp 98.0°F | Ht 68.0 in | Wt 173.6 lb

## 2022-07-03 DIAGNOSIS — I10 Essential (primary) hypertension: Secondary | ICD-10-CM | POA: Diagnosis not present

## 2022-07-03 DIAGNOSIS — E538 Deficiency of other specified B group vitamins: Secondary | ICD-10-CM | POA: Diagnosis not present

## 2022-07-03 DIAGNOSIS — I480 Paroxysmal atrial fibrillation: Secondary | ICD-10-CM

## 2022-07-03 DIAGNOSIS — E559 Vitamin D deficiency, unspecified: Secondary | ICD-10-CM

## 2022-07-03 DIAGNOSIS — E785 Hyperlipidemia, unspecified: Secondary | ICD-10-CM

## 2022-07-03 DIAGNOSIS — B351 Tinea unguium: Secondary | ICD-10-CM | POA: Insufficient documentation

## 2022-07-03 LAB — COMPREHENSIVE METABOLIC PANEL
ALT: 10 U/L (ref 0–53)
AST: 14 U/L (ref 0–37)
Albumin: 3.9 g/dL (ref 3.5–5.2)
Alkaline Phosphatase: 50 U/L (ref 39–117)
BUN: 21 mg/dL (ref 6–23)
CO2: 30 mEq/L (ref 19–32)
Calcium: 8.6 mg/dL (ref 8.4–10.5)
Chloride: 102 mEq/L (ref 96–112)
Creatinine, Ser: 1.16 mg/dL (ref 0.40–1.50)
GFR: 64.25 mL/min (ref >60.00)
Glucose, Bld: 86 mg/dL (ref 70–99)
Potassium: 4.4 mEq/L (ref 3.5–5.1)
Sodium: 138 mEq/L (ref 135–145)
Total Bilirubin: 1.2 mg/dL (ref 0.2–1.2)
Total Protein: 6.3 g/dL (ref 6.0–8.3)

## 2022-07-03 LAB — LIPID PANEL
Cholesterol: 197 mg/dL (ref 0–200)
HDL: 41.4 mg/dL (ref >39.00)
LDL Cholesterol: 130 mg/dL — ABNORMAL HIGH (ref 0–99)
NonHDL: 155.95
Total CHOL/HDL Ratio: 5
Triglycerides: 132 mg/dL (ref 0.0–149.0)
VLDL: 26.4 mg/dL (ref 0.0–40.0)

## 2022-07-03 MED ORDER — CICLOPIROX 8 % EX SOLN: Freq: Every day | CUTANEOUS | 1 refills | Status: DC

## 2022-07-03 NOTE — Assessment & Plan Note (Signed)
[place] qd

## 2022-07-03 NOTE — Assessment & Plan Note (Signed)
Chronic  Vit D 

## 2022-07-03 NOTE — Assessment & Plan Note (Signed)
Cont w/Atenolol, Losartan 

## 2022-07-03 NOTE — Progress Notes (Signed)
Subjective:  Patient ID: Evan Farmer, male    DOB: June 29, 1951  Age: 71 y.o. MRN: [id number]  CC: Follow-up (6 month f/u)   HPI [name] presents for anxiety, palpitations, A fib, HTN  Outpatient Medications Prior to Visit  Medication Sig Dispense Refill   ALPRAZolam (XANAX) 0.5 MG tablet TAKE 1 TABLET TWICE A DAY  AS NEEDED FOR ANXIETY 180 tablet 1   atenolol (TENORMIN) 25 MG tablet Take 1 tablet (25 mg total) by mouth daily. (Patient taking differently: Take 25 mg by mouth 2 (two) times daily.) 90 tablet 3   Cholecalciferol (VITAMIN D3) 50 MCG (2000 UT) capsule Take 1 capsule (2,000 Units total) by mouth daily. 100 capsule 3   ELIQUIS 5 MG TABS tablet TAKE 1 TABLET TWICE A DAY 180 tablet 1   flecainide (TAMBOCOR) 100 MG tablet TAKE 1 TABLET TWICE A DAY 180 tablet 3   losartan (COZAAR) 25 MG tablet TAKE 1 TABLET DAILY 90 tablet 3   Multiple Vitamins-Minerals (MULTIVITAMIN WITH MINERALS) tablet Take 1 tablet by mouth daily.     sildenafil (VIAGRA) 100 MG tablet Take 1 tablet (100 mg total) by mouth daily as needed for erectile dysfunction. 12 tablet 5   lidocaine (XYLOCAINE) 2 % solution Use as directed 15 mLs in the mouth or throat as needed for mouth pain. (Patient not taking: Reported on 07/03/2022) 100 mL 0   No facility-administered medications prior to visit.    ROS: Review of Systems  Constitutional:  Negative for appetite change, fatigue and unexpected weight change.  HENT:  Negative for congestion, nosebleeds, sneezing, sore throat and trouble swallowing.   Eyes:  Negative for itching and visual disturbance.  Respiratory:  Negative for cough.   Cardiovascular:  Negative for chest pain, palpitations and leg swelling.  Gastrointestinal:  Negative for abdominal distention, blood in stool, diarrhea and nausea.  Genitourinary:  Negative for frequency and hematuria.  Musculoskeletal:  Negative for back pain, gait problem, joint swelling and neck pain.  Skin:  Negative for  rash.  Neurological:  Negative for dizziness, tremors, speech difficulty and weakness.  Psychiatric/Behavioral:  Negative for agitation, dysphoric mood, sleep disturbance and suicidal ideas. The patient is not nervous/anxious.     Objective:  BP 128/70 (BP Location: Left Arm)   Pulse 83   Temp 98 F (36.7 C) (Oral)   Ht '5\' 8"'$  (1.727 m)   Wt 173 lb 9.6 oz (78.7 kg)   SpO2 96%   BMI 26.40 kg/m   BP Readings from Last 3 Encounters:  07/03/22 128/70  01/26/22 (!) 205/96  01/04/22 (!) 152/82    Wt Readings from Last 3 Encounters:  07/03/22 173 lb 9.6 oz (78.7 kg)  01/04/22 185 lb (83.9 kg)  01/02/22 183 lb 12.8 oz (83.4 kg)    Physical Exam Constitutional:      General: He is not in acute distress.    Appearance: He is well-developed.     Comments: NAD  Eyes:     Conjunctiva/sclera: Conjunctivae normal.     Pupils: Pupils are equal, round, and reactive to light.  Neck:     Thyroid: No thyromegaly.     Vascular: No JVD.  Cardiovascular:     Rate and Rhythm: Normal rate and regular rhythm.     Heart sounds: Normal heart sounds. No murmur heard.    No friction rub. No gallop.  Pulmonary:     Effort: Pulmonary effort is normal. No respiratory distress.  Breath sounds: Normal breath sounds. No wheezing or rales.  Chest:     Chest wall: No tenderness.  Abdominal:     General: Bowel sounds are normal. There is no distension.     Palpations: Abdomen is soft. There is no mass.     Tenderness: There is no abdominal tenderness. There is no guarding or rebound.  Musculoskeletal:        General: No tenderness. Normal range of motion.     Cervical back: Normal range of motion.  Lymphadenopathy:     Cervical: No cervical adenopathy.  Skin:    General: Skin is warm and dry.     Findings: No rash.  Neurological:     Mental Status: He is alert and oriented to person, place, and time.     Cranial Nerves: No cranial nerve deficit.     Motor: No abnormal muscle tone.      Coordination: Coordination normal.     Gait: Gait normal.     Deep Tendon Reflexes: Reflexes are normal and symmetric.  Psychiatric:        Behavior: Behavior normal.        Thought Content: Thought content normal.        Judgment: Judgment normal.     Lab Results  Component Value Date   WBC 3.4 (L) 01/02/2022   HGB 15.4 01/02/2022   HCT 45.6 01/02/2022   PLT 140.0 (L) 01/02/2022   GLUCOSE 98 01/02/2022   CHOL 219 (H) 01/02/2022   TRIG 114.0 01/02/2022   HDL 53.10 01/02/2022   LDLCALC 143 (H) 01/02/2022   ALT 13 01/02/2022   AST 17 01/02/2022   NA 138 01/02/2022   K 4.3 01/02/2022   CL 102 01/02/2022   CREATININE 1.09 01/02/2022   BUN 16 01/02/2022   CO2 33 (H) 01/02/2022   TSH 1.47 01/02/2022   PSA <0.1 06/20/2020   INR 0.95 02/12/2011   HGBA1C  01/20/2011    5.2 (NOTE)                                                                       According to the ADA Clinical Practice Recommendations for 2011, when HbA1c is used as a screening test:   >=6.5%   Diagnostic of Diabetes Mellitus           (if abnormal result  is confirmed)  5.7-6.4%   Increased risk of developing Diabetes Mellitus  References:Diagnosis and Classification of Diabetes Mellitus,Diabetes HGDJ,2426,83(MHDQQ 1):S62-S69 and Standards of Medical Care in         Diabetes - 2011,Diabetes Care,2011,34  (Suppl 1):S11-S61.    No results found.  Assessment & Plan:   Problem List Items Addressed This Visit     Atrial fibrillation ([place])    Cont on Eliquis, Flecainide        Relevant Orders   Comprehensive metabolic panel   Lipid panel   B12 deficiency    On B12 nasal or SL. 2022: SL      Dyslipidemia - Primary   Relevant Orders   Lipid panel   HTN (hypertension)    Cont w/Atenolol, Losartan      Relevant Orders   Comprehensive metabolic panel   Lipid panel   Onychomycosis  [place] qd      Relevant Medications   ciclopirox (PENLAC) 8 % solution   Vitamin D deficiency    Chronic  Vit D          Meds ordered this encounter  Medications   ciclopirox (PENLAC) 8 % solution    Sig: Apply topically at bedtime. Apply over nail and surrounding skin daily    Dispense:  6.6 mL    Refill:  1      Follow-up: Return in about 3 months (around 10/03/2022) for a follow-up visit.  Walker Kehr, MD

## 2022-07-03 NOTE — Assessment & Plan Note (Signed)
Cont on Eliquis, Flecainide

## 2022-07-03 NOTE — Assessment & Plan Note (Signed)
On B12 nasal or SL. 2022: SL

## 2022-07-06 ENCOUNTER — Telehealth: Payer: Self-pay | Admitting: Internal Medicine

## 2022-07-06 NOTE — Telephone Encounter (Signed)
Patient complained of episodes of A. FIB that come and go. Patient stated his atenolol was recently changed from 50 mg to 25 mg due to low HR. Patient stated he was told he could take an extra atenolol when this happens. Informed patient that this would be a good solution until we hear from Oda Kilts PA for further advisement.

## 2022-07-06 NOTE — Telephone Encounter (Signed)
Patient c/o Palpitations:  High priority if patient c/o lightheadedness, shortness of breath, or chest pain  How long have you had palpitations/irregular HR/ Afib? Are you having the symptoms now? Afib started after change of meds (per mychart message)  Are you currently experiencing lightheadedness, SOB or CP? No  Do you have a history of afib (atrial fibrillation) or irregular heart rhythm? Yes  Have you checked your BP or HR? (document readings if available): 88 - 108  Are you experiencing any other symptoms? No  Pt states that HR have been steadily increasing.

## 2022-07-09 NOTE — Telephone Encounter (Signed)
Called pt to let him know we were referring him to the Afib clinic. He stated that someone had already called and told him that.  He is aware that someone from that clinic will call to get him scheduled.

## 2022-07-12 ENCOUNTER — Other Ambulatory Visit: Payer: Self-pay | Admitting: Internal Medicine

## 2022-07-12 NOTE — Telephone Encounter (Signed)
Check [place] registry last filled 03/21/2022.Marland KitchenJohny Farmer

## 2022-07-13 ENCOUNTER — Ambulatory Visit (INDEPENDENT_AMBULATORY_CARE_PROVIDER_SITE_OTHER): Payer: 59

## 2022-07-13 VITALS — Ht 68.0 in | Wt 168.0 lb

## 2022-07-13 DIAGNOSIS — Z0000 Encounter for general adult medical examination without abnormal findings: Secondary | ICD-10-CM

## 2022-07-13 NOTE — Patient Instructions (Signed)
Evan Farmer , Thank you for taking time to come for your Medicare Wellness Visit. I appreciate your ongoing commitment to your health goals. Please review the following plan we discussed and let me know if I can assist you in the future.   Screening recommendations/referrals: Colonoscopy:  Recommended yearly ophthalmology/optometry visit for glaucoma screening and checkup Recommended yearly dental visit for hygiene and checkup  Vaccinations: Influenza vaccine: completed  Pneumococcal vaccine: completed  Tdap vaccine: 05/14/20019 Shingles vaccine: completed    Covid-19: completed   Advanced directives: none   Conditions/risks identified: Aim for 30 minutes of exercise or brisk walking, 6-8 glasses of water, and 5 servings of fruits and vegetables each day.   Next appointment: Follow up in one year for your annual wellness visit.   Preventive Care 69 Years and Older, Male  Preventive care refers to lifestyle choices and visits with your health care provider that can promote health and wellness. What does preventive care include? A yearly physical exam. This is also called an annual well check. Dental exams once or twice a year. Routine eye exams. Ask your health care provider how often you should have your eyes checked. Personal lifestyle choices, including: Daily care of your teeth and gums. Regular physical activity. Eating a healthy diet. Avoiding tobacco and drug use. Limiting alcohol use. Practicing safe sex. Taking low doses of aspirin every day. Taking vitamin and mineral supplements as recommended by your health care provider. What happens during an annual well check? The services and screenings done by your health care provider during your annual well check will depend on your age, overall health, lifestyle risk factors, and family history of disease. Counseling  Your health care provider may ask you questions about your: Alcohol use. Tobacco use. Drug use. Emotional  well-being. Home and relationship well-being. Sexual activity. Eating habits. History of falls. Memory and ability to understand (cognition). Work and work Statistician. Screening  You may have the following tests or measurements: Height, weight, and BMI. Blood pressure. Lipid and cholesterol levels. These may be checked every 5 years, or more frequently if you are over 26 years old. Skin check. Lung cancer screening. You may have this screening every year starting at age 69 if you have a 30-pack-year history of smoking and currently smoke or have quit within the past 15 years. Fecal occult blood test (FOBT) of the stool. You may have this test every year starting at age 69. Flexible sigmoidoscopy or colonoscopy. You may have a sigmoidoscopy every 5 years or a colonoscopy every 10 years starting at age 69. Prostate cancer screening. Recommendations will vary depending on your family history and other risks. Hepatitis C blood test. Hepatitis B blood test. Sexually transmitted disease (STD) testing. Diabetes screening. This is done by checking your blood sugar (glucose) after you have not eaten for a while (fasting). You may have this done every 1-3 years. Abdominal aortic aneurysm (AAA) screening. You may need this if you are a current or former smoker. Osteoporosis. You may be screened starting at age 69 if you are at high risk. Talk with your health care provider about your test results, treatment options, and if necessary, the need for more tests. Vaccines  Your health care provider may recommend certain vaccines, such as: Influenza vaccine. This is recommended every year. Tetanus, diphtheria, and acellular pertussis (Tdap, Td) vaccine. You may need a Td booster every 10 years. Zoster vaccine. You may need this after age 15. Pneumococcal 13-valent conjugate (PCV13) vaccine. One dose  is recommended after age 69. Pneumococcal polysaccharide (PPSV23) vaccine. One dose is recommended after  age 69. Talk to your health care provider about which screenings and vaccines you need and how often you need them. This information is not intended to replace advice given to you by your health care provider. Make sure you discuss any questions you have with your health care provider. Document Released: 11/25/2015 Document Revised: 07/18/2016 Document Reviewed: 08/30/2015 Elsevier Interactive Patient Education  2017 [place] Prevention in the Home Falls can cause injuries. They can happen to people of all ages. There are many things you can do to make your home safe and to help prevent falls. What can I do on the outside of my home? Regularly fix the edges of walkways and driveways and fix any cracks. Remove anything that might make you trip as you walk through a door, such as a raised step or threshold. Trim any bushes or trees on the path to your home. Use bright outdoor lighting. Clear any walking paths of anything that might make someone trip, such as rocks or tools. Regularly check to see if handrails are loose or broken. Make sure that both sides of any steps have handrails. Any raised decks and porches should have guardrails on the edges. Have any leaves, snow, or ice cleared regularly. Use sand or salt on walking paths during winter. Clean up any spills in your garage right away. This includes oil or grease spills. What can I do in the bathroom? Use night lights. Install grab bars by the toilet and in the tub and shower. Do not use towel bars as grab bars. Use non-skid mats or decals in the tub or shower. If you need to sit down in the shower, use a plastic, non-slip stool. Keep the floor dry. Clean up any water that spills on the floor as soon as it happens. Remove soap buildup in the tub or shower regularly. Attach bath mats securely with double-sided non-slip rug tape. Do not have throw rugs and other things on the floor that can make you trip. What can I do in the  bedroom? Use night lights. Make sure that you have a light by your bed that is easy to reach. Do not use any sheets or blankets that are too big for your bed. They should not hang down onto the floor. Have a firm chair that has side arms. You can use this for support while you get dressed. Do not have throw rugs and other things on the floor that can make you trip. What can I do in the kitchen? Clean up any spills right away. Avoid walking on wet floors. Keep items that you use a lot in easy-to-reach places. If you need to reach something above you, use a strong step stool that has a grab bar. Keep electrical cords out of the way. Do not use floor polish or wax that makes floors slippery. If you must use wax, use non-skid floor wax. Do not have throw rugs and other things on the floor that can make you trip. What can I do with my stairs? Do not leave any items on the stairs. Make sure that there are handrails on both sides of the stairs and use them. Fix handrails that are broken or loose. Make sure that handrails are as long as the stairways. Check any carpeting to make sure that it is firmly attached to the stairs. Fix any carpet that is loose or worn. Avoid  having throw rugs at the top or bottom of the stairs. If you do have throw rugs, attach them to the floor with carpet tape. Make sure that you have a light switch at the top of the stairs and the bottom of the stairs. If you do not have them, ask someone to add them for you. What else can I do to help prevent falls? Wear shoes that: Do not have high heels. Have rubber bottoms. Are comfortable and fit you well. Are closed at the toe. Do not wear sandals. If you use a stepladder: Make sure that it is fully opened. Do not climb a closed stepladder. Make sure that both sides of the stepladder are locked into place. Ask someone to hold it for you, if possible. Clearly mark and make sure that you can see: Any grab bars or  handrails. First and last steps. Where the edge of each step is. Use tools that help you move around (mobility aids) if they are needed. These include: Canes. Walkers. Scooters. Crutches. Turn on the lights when you go into a dark area. Replace any light bulbs as soon as they burn out. Set up your furniture so you have a clear path. Avoid moving your furniture around. If any of your floors are uneven, fix them. If there are any pets around you, be aware of where they are. Review your medicines with your doctor. Some medicines can make you feel dizzy. This can increase your chance of falling. Ask your doctor what other things that you can do to help prevent falls. This information is not intended to replace advice given to you by your health care provider. Make sure you discuss any questions you have with your health care provider. Document Released: 08/25/2009 Document Revised: 04/05/2016 Document Reviewed: 12/03/2014 Elsevier Interactive Patient Education  2017 [employer].

## 2022-07-13 NOTE — Progress Notes (Cosign Needed Addendum)
Subjective:   Evan Farmer is a 71 y.o. male who presents for Medicare Annual/Subsequent preventive examination.   Virtual Visit via Telephone Note  I connected with  Evan Farmer on 07/13/22 at 10:30 AM EDT by telephone and verified that I am speaking with the correct person using two identifiers.  Location: Patient: home  Provider: [facility]  Persons participating in the virtual visit: patient/Nurse Health Advisor   I discussed the limitations, risks, security and privacy concerns of performing an evaluation and management service by telephone and the availability of in person appointments. The patient expressed understanding and agreed to proceed.  Interactive audio and video telecommunications were attempted between this nurse and patient, however failed, due to patient having technical difficulties OR patient did not have access to video capability.  We continued and completed visit with audio only.  Some vital signs may be absent or patient reported.   Daphane Shepherd, LPN  Review of Systems     Cardiac Risk Factors include: advanced age (>49mn, >>70women);male gender     Objective:    Today's Vitals   07/13/22 1048  Weight: 168 lb (76.2 kg)  Height: '5\' 8"'$  (1.727 m)   Body mass index is 25.54 kg/m.     07/13/2022   10:52 AM 12/19/2021    8:03 AM 07/07/2021    1:10 PM 07/10/2016    8:53 AM 01/30/2015   12:58 PM 11/08/2014    4:23 PM  Advanced Directives  Does Patient Have a Medical Advance Directive? No No No No No No  Would patient like information on creating a medical advance directive? No - Patient declined No - Patient declined No - Patient declined No - patient declined information      Current Medications (verified) Outpatient Encounter Medications as of 07/13/2022  Medication Sig   ALPRAZolam (XANAX) 0.5 MG tablet TAKE 1 TABLET TWICE A DAY  AS NEEDED FOR ANXIETY   Cholecalciferol (VITAMIN D3) 50 MCG (2000 UT) capsule Take 1 capsule (2,000 Units total)  by mouth daily.   ciclopirox (PENLAC) 8 % solution Apply topically at bedtime. Apply over nail and surrounding skin daily   ELIQUIS 5 MG TABS tablet TAKE 1 TABLET TWICE A DAY   flecainide (TAMBOCOR) 100 MG tablet TAKE 1 TABLET TWICE A DAY   losartan (COZAAR) 25 MG tablet TAKE 1 TABLET DAILY   Multiple Vitamins-Minerals (MULTIVITAMIN WITH MINERALS) tablet Take 1 tablet by mouth daily.   sildenafil (VIAGRA) 100 MG tablet Take 1 tablet (100 mg total) by mouth daily as needed for erectile dysfunction.   atenolol (TENORMIN) 25 MG tablet Take 1 tablet (25 mg total) by mouth daily. (Patient taking differently: Take 25 mg by mouth 2 (two) times daily.)   No facility-administered encounter medications on file as of 07/13/2022.    Allergies (verified) Procainamide hcl and Quinidine   History: Past Medical History:  Diagnosis Date   Anxiety    Cataract    Depression    Diverticulosis    HTN (hypertension)    Hyperlipidemia    Paroxysmal A-fib (HCC)    [facility]    2010 [facility] Hart   [facility]    [facility] 71  S/P cardiac cath 02/2011   No obstructive disease. Normal LV function   Scoliosis    TIA (transient ischemic attack)    2012   Tubular adenoma of colon    Vitamin B 12 deficiency    Vitamin D deficiency  Weight gain    Past Surgical History:  Procedure Laterality Date   CARDIOVERSION N/A 07/10/2016   Procedure: CARDIOVERSION;  Surgeon: Lelon Perla, MD;  Location: [facility] ENDOSCOPY;  Service: Cardiovascular;  Laterality: N/A;   CATARACT EXTRACTION W/ INTRAOCULAR LENS  IMPLANT, BILATERAL     COLONOSCOPY  01-24-2005   TICS ONLY    COLONOSCOPY WITH PROPOFOL N/A 12/19/2021   Procedure: COLONOSCOPY WITH PROPOFOL;  Surgeon: Jerene Bears, MD;  Location: [facility] ENDOSCOPY;  Service: Gastroenterology;  Laterality: N/A;   ENDOSCOPIC MUCOSAL RESECTION N/A 12/19/2021   Procedure: ENDOSCOPIC MUCOSAL RESECTION;  Surgeon: Jerene Bears, MD;  Location: [facility] ENDOSCOPY;   Service: Gastroenterology;  Laterality: N/A;   HEMOSTASIS CLIP PLACEMENT  12/19/2021   Procedure: HEMOSTASIS CLIP PLACEMENT;  Surgeon: Jerene Bears, MD;  Location: [facility] ENDOSCOPY;  Service: Gastroenterology;;   [place] and 2003   twice   NASAL SEPTUM SURGERY     POLYPECTOMY  12/19/2021   Procedure: POLYPECTOMY;  Surgeon: Jerene Bears, MD;  Location: [facility] ENDOSCOPY;  Service: Gastroenterology;;   PROSTATECTOMY  11/2008   Robotic    SUBMUCOSAL LIFTING INJECTION  12/19/2021   Procedure: SUBMUCOSAL LIFTING INJECTION;  Surgeon: Jerene Bears, MD;  Location: [facility] ENDOSCOPY;  Service: Gastroenterology;;   SUBMUCOSAL TATTOO INJECTION  12/19/2021   Procedure: SUBMUCOSAL TATTOO INJECTION;  Surgeon: Jerene Bears, MD;  Location: [facility] ENDOSCOPY;  Service: Gastroenterology;;   TEE WITHOUT CARDIOVERSION N/A 07/10/2016   Procedure: TRANSESOPHAGEAL ECHOCARDIOGRAM (TEE);  Surgeon: Lelon Perla, MD;  Location: [facility] ENDOSCOPY;  Service: Cardiovascular;  Laterality: N/A;   Family History  Problem Relation Age of Onset   Arrhythmia Mother    Atrial fibrillation Mother    Cancer Father    Lung cancer Other    Prostate cancer Other    Colon cancer Paternal Uncle    Rectal cancer Neg Hx    Stomach cancer Neg Hx    Social History   Socioeconomic History   Marital status: Divorced    Spouse name: Not on file   Number of children: Not on file   Years of education: Not on file   Highest education level: Not on file  Occupational History   Not on file  Tobacco Use   Smoking status: Never   Smokeless tobacco: Never  Vaping Use   Vaping Use: Never used  Substance and Sexual Activity   Alcohol use: Yes    Alcohol/week: 8.0 standard drinks of alcohol    Types: 4 Glasses of wine, 4 Shots of liquor per week    Comment: Social alcohol use   Drug use: No   Sexual activity: Not on file  Other Topics Concern   Not on file  Social History Narrative   Regular Exercise -  YES   Social  Determinants of Health   Financial Resource Strain: Low Risk  (07/13/2022)   Overall Financial Resource Strain (CARDIA)    Difficulty of Paying Living Expenses: Not hard at all  Food Insecurity: No Food Insecurity (07/13/2022)   Hunger Vital Sign    Worried About Running Out of Food in the Last Year: Never true    Ran Out of Food in the Last Year: Never true  Transportation Needs: No Transportation Needs (07/13/2022)   PRAPARE - Hydrologist (Medical): No    Lack of Transportation (Non-Medical): No  Physical Activity: Sufficiently Active (07/13/2022)   Exercise Vital Sign  Days of Exercise per Week: 5 days    Minutes of Exercise per Session: 30 min  Stress: No Stress Concern Present (07/13/2022)   [place]    Feeling of Stress : Not at all  Social Connections: Socially Integrated (07/13/2022)   Social Connection and Isolation Panel [NHANES]    Frequency of Communication with Friends and Family: More than three times a week    Frequency of Social Gatherings with Friends and Family: More than three times a week    Attends Religious Services: More than 4 times per year    Active Member of [employer] or Organizations: Yes    Attends Music therapist: More than 4 times per year    Marital Status: Living with partner    Tobacco Counseling Counseling given: Not Answered   Clinical Intake:  Pre-visit preparation completed: Yes  Pain : No/denies pain     Diabetes: No  How often do you need to have someone help you when you read instructions, pamphlets, or other written materials from your doctor or pharmacy?: 1 - Never What is the last grade level you completed in school?: college  Diabetic?no   Interpreter Needed?: No  Information entered by :: L.Wilson,LPN   Activities of Daily Living    07/13/2022   10:53 AM  In your present state of health, do you have any difficulty  performing the following activities:  Hearing? 0  Vision? 0  Difficulty concentrating or making decisions? 0  Walking or climbing stairs? 0  Dressing or bathing? 0  Doing errands, shopping? 0  Preparing Food and eating ? N  Using the Toilet? N  In the past six months, have you accidently leaked urine? N  Do you have problems with loss of bowel control? N  Managing your Medications? N  Managing your Finances? N  Housekeeping or managing your Housekeeping? N    Patient Care Team: Plotnikov, Evie Lacks, MD as PCP - Starleen Arms, MD as Consulting Physician (Urology) Thompson Grayer, MD as Consulting Physician (Cardiology) Pyrtle, Lajuan Lines, MD as Consulting Physician (Gastroenterology) Calvert Cantor, MD as Consulting Physician (Ophthalmology)  Indicate any recent Medical Services you may have received from other than [facility] providers in the past year (date may be approximate).     Assessment:   This is a routine wellness examination for Roland.  Hearing/Vision screen Vision Screening - Comments:: Patient to schedule annual eye exam reading glasses   Dietary issues and exercise activities discussed:     Goals Addressed               This Visit's Progress     Patient Stated (pt-stated)   On track     My goal is to continue to lose weight by eating healthier, staying hydrated and being physically active. My weight goal is 165 pounds.        Depression Screen    07/13/2022   10:51 AM 07/07/2021    1:26 PM 03/25/2018    9:42 AM  PHQ 2/9 Scores  PHQ - 2 Score 0 0 0    Fall Risk    07/13/2022   10:50 AM 07/07/2021    1:28 PM 06/20/2021   10:48 AM 12/22/2019   10:09 AM 03/25/2018    9:42 AM  Fall Risk   Falls in the past year? 0 0 1 0 No  Number falls in past yr: 0 0 0 0   Injury with Fall?  0 0 0 0   Risk for fall due to : No Fall Risks No Fall Risks No Fall Risks    Follow up Falls prevention discussed Falls evaluation completed       FALL RISK PREVENTION PERTAINING  TO THE HOME:  Any stairs in or around the home? No  If so, are there any without handrails? No  Home free of loose throw rugs in walkways, pet beds, electrical cords, etc? Yes  Adequate lighting in your home to reduce risk of falls? Yes   ASSISTIVE DEVICES UTILIZED TO PREVENT FALLS:  Life alert? No  Use of a cane, walker or w/c? No  Grab bars in the bathroom? No  Shower chair or bench in shower? No  Elevated toilet seat or a handicapped toilet? No          07/13/2022   10:53 AM  6CIT Screen  What Year? 0 points  What month? 0 points  What time? 0 points  Count back from 20 0 points  Months in reverse 0 points  Repeat phrase 0 points  Total Score 0 points    Immunizations Immunization History  Administered Date(s) Administered   Fluad Quad(high Dose 65+) 08/12/2020, 08/10/2021   Influenza Whole 10/24/2007, 08/31/2008, 08/15/2009, 11/08/2010   Influenza,inj,Quad PF,6+ Mos 07/17/2013, 07/21/2014, 08/16/2015   Influenza-Unspecified 08/14/2016, 07/30/2017, 09/12/2018   Moderna SARS-COV2 Booster Vaccination 11/06/2020   PFIZER(Purple Top)SARS-COV-2 Vaccination 01/05/2020, 01/26/2020   Pfizer Covid-19 Vaccine Bivalent Booster 22yr & up 08/15/2021   Pneumococcal Conjugate-13 09/25/2016   Pneumococcal Polysaccharide-23 09/24/2017   Td 08/15/2009   Tdap 03/25/2018   Zoster Recombinat (Shingrix) 07/30/2017, 12/26/2017   Zoster, Live 01/19/2014    TDAP status: Up to date  Flu Vaccine status: Up to date  Pneumococcal vaccine status: Up to date  Covid-19 vaccine status: Completed vaccines  Qualifies for Shingles Vaccine? Yes   Zostavax completed Yes   Shingrix Completed?: Yes  Screening Tests Health Maintenance  Topic Date Due   Hepatitis C Screening  Never done   COVID-19 Vaccine (4 - Pfizer series) 12/16/2021   INFLUENZA VACCINE  06/12/2022   Pneumonia Vaccine 69 Years old (3 - PPSV23 or PCV20) 09/24/2022   COLONOSCOPY (Pts 45-425yrInsurance coverage will need  to be confirmed)  12/19/2022   TETANUS/TDAP  03/25/2028   Zoster Vaccines- Shingrix  Completed   HPV VACCINES  Aged Out    Health Maintenance  Health Maintenance Due  Topic Date Due   Hepatitis C Screening  Never done   COVID-19 Vaccine (4 - Pfizer series) 12/16/2021   INFLUENZA VACCINE  06/12/2022   Pneumonia Vaccine 6549Years old (3 - PPSV23 or PCV20) 09/24/2022    Colorectal cancer screening: Type of screening: Colonoscopy. Completed 12/19/2021. Repeat every 1 years  Lung Cancer Screening: (Low Dose CT Chest recommended if Age 69-80ears, 30 pack-year currently smoking OR have quit w/in 15years.) does not qualify.   Lung Cancer Screening Referral: n/a  Additional Screening:  Hepatitis C Screening: does not qualify;   Vision Screening: Recommended annual ophthalmology exams for early detection of glaucoma and other disorders of the eye. Is the patient up to date with their annual eye exam?  No  Who is the provider or what is the name of the office in which the patient attends annual eye exams? [facility]If pt is not established with a provider, would they like to be referred to a provider to establish care? No .   Dental Screening: Recommended annual dental  exams for proper oral hygiene  Community Resource Referral / Chronic Care Management: CRR required this visit?  No   CCM required this visit?  No      Plan:     I have personally reviewed and noted the following in the patient's chart:   Medical and social history Use of alcohol, tobacco or illicit drugs  Current medications and supplements including opioid prescriptions. Patient is not currently taking opioid prescriptions. Functional ability and status Nutritional status Physical activity Advanced directives List of other physicians Hospitalizations, surgeries, and ER visits in previous 12 months Vitals Screenings to include cognitive, depression, and falls Referrals and appointments  In addition, I have  reviewed and discussed with patient certain preventive protocols, quality metrics, and best practice recommendations. A written personalized care plan for preventive services as well as general preventive health recommendations were provided to patient.     Daphane Shepherd, LPN   06/14/2548   Nurse Notes: Aim for 30 minutes of exercise or brisk walking, 6-8 glasses of water, and 5 servings of fruits and vegetables each day.   Medical screening examination/treatment/procedure(s) were performed by non-physician practitioner and as supervising physician I was immediately available for consultation/collaboration.  I agree with above. Lew Dawes, MD

## 2022-07-18 ENCOUNTER — Encounter (HOSPITAL_COMMUNITY): Payer: Self-pay | Admitting: Nurse Practitioner

## 2022-07-18 ENCOUNTER — Ambulatory Visit (HOSPITAL_COMMUNITY)
Admission: RE | Admit: 2022-07-18 | Discharge: 2022-07-18 | Disposition: A | Discharge status: Home / Self Care | Payer: 59 | Source: Ambulatory Visit | Attending: Nurse Practitioner | Admitting: Nurse Practitioner

## 2022-07-18 VITALS — BP 138/82 | HR 90 | Ht 68.0 in | Wt 173.4 lb

## 2022-07-18 DIAGNOSIS — Z79899 Other long term (current) drug therapy: Secondary | ICD-10-CM | POA: Insufficient documentation

## 2022-07-18 DIAGNOSIS — I4891 Unspecified atrial fibrillation: Secondary | ICD-10-CM | POA: Diagnosis not present

## 2022-07-18 DIAGNOSIS — I4892 Unspecified atrial flutter: Secondary | ICD-10-CM | POA: Insufficient documentation

## 2022-07-18 DIAGNOSIS — D6869 Other thrombophilia: Secondary | ICD-10-CM

## 2022-07-18 DIAGNOSIS — I480 Paroxysmal atrial fibrillation: Secondary | ICD-10-CM | POA: Diagnosis not present

## 2022-07-18 NOTE — Progress Notes (Signed)
Primary Care Physician: Plotnikov, Evie Lacks, MD Referring Physician: self referred Prior EP: Dr. Chipper Herb is a 71 y.o. male with a h/o afib on flecainide very well controlled for years. He is not in the afib clinic for increased afib burden over the last 3 weeks. He feels he is paroxysmal. He is in atrial flutter today with variable block, rate controlled. He recently;y increased his atenolol to 50 mg daily. He is not that symptomatic but picked up the higher HR on BP machine. He tried an extra 1/2 flecainide for a day or two when it first started. He has recently lost 10 lbs and is walking  or a regular basis.   Today, he denies symptoms of palpitations, chest pain, shortness of breath, orthopnea, PND, lower extremity edema, dizziness, presyncope, syncope, or neurologic sequela. The patient is tolerating medications without difficulties and is otherwise without complaint today.   Past Medical History:  Diagnosis Date   Anxiety    Cataract    Depression    Diverticulosis    HTN (hypertension)    Hyperlipidemia    Paroxysmal A-fib ([place])    Prostate cancer [facility])    2010 Dr Terance Hart    [facility] spotted fever    age 86   S/P cardiac cath 02/2011   No obstructive disease. Normal LV function   Scoliosis    TIA (transient ischemic attack)    2012   Tubular adenoma of colon    Vitamin B 12 deficiency    Vitamin D deficiency    Weight gain    Past Surgical History:  Procedure Laterality Date   CARDIOVERSION N/A 07/10/2016   Procedure: CARDIOVERSION;  Surgeon: Lelon Perla, MD;  Location: [place];  Service: Cardiovascular;  Laterality: N/A;   CATARACT EXTRACTION W/ INTRAOCULAR LENS  IMPLANT, BILATERAL     COLONOSCOPY  01-24-2005   TICS ONLY    COLONOSCOPY WITH PROPOFOL N/A 12/19/2021   Procedure: COLONOSCOPY WITH PROPOFOL;  Surgeon: Jerene Bears, MD;  [facility];  Service: Gastroenterology;  Laterality: N/A;   ENDOSCOPIC MUCOSAL RESECTION N/A  12/19/2021   Procedure: ENDOSCOPIC MUCOSAL RESECTION;  Surgeon: Jerene Bears, MD;  [facility];  Service: Gastroenterology;  Laterality: N/A;   HEMOSTASIS CLIP PLACEMENT  12/19/2021   Procedure: HEMOSTASIS CLIP PLACEMENT;  Surgeon: Jerene Bears, MD;  [facility];  Service: Gastroenterology;;   [place] and 2003   twice   NASAL SEPTUM SURGERY     POLYPECTOMY  12/19/2021   Procedure: POLYPECTOMY;  Surgeon: Jerene Bears, MD;  [facility];  Service: Gastroenterology;;   PROSTATECTOMY  11/2008   Robotic    SUBMUCOSAL LIFTING INJECTION  12/19/2021   Procedure: SUBMUCOSAL LIFTING INJECTION;  Surgeon: Jerene Bears, MD;  Location: [facility];  Service: Gastroenterology;;   SUBMUCOSAL TATTOO INJECTION  12/19/2021   Procedure: SUBMUCOSAL TATTOO INJECTION;  Surgeon: Jerene Bears, MD;  [facility];  Service: Gastroenterology;;   TEE WITHOUT CARDIOVERSION N/A 07/10/2016   Procedure: TRANSESOPHAGEAL ECHOCARDIOGRAM (TEE);  Surgeon: Lelon Perla, MD;  Location: [facility] ENDOSCOPY;  Service: Cardiovascular;  Laterality: N/A;    Current Outpatient Medications  Medication Sig Dispense Refill   ALPRAZolam (XANAX) 0.5 MG tablet TAKE 1 TABLET TWICE A DAY  AS NEEDED FOR ANXIETY 180 tablet 1   atenolol (TENORMIN) 25 MG tablet Take 1 tablet (25 mg total) by mouth daily. (Patient  taking differently: Take 25 mg by mouth 2 (two) times daily.) 90 tablet 3   Cholecalciferol (VITAMIN D3) 50 MCG (2000 UT) capsule Take 1 capsule (2,000 Units total) by mouth daily. 100 capsule 3   ELIQUIS 5 MG TABS tablet TAKE 1 TABLET TWICE A DAY 180 tablet 1   flecainide (TAMBOCOR) 100 MG tablet TAKE 1 TABLET TWICE A DAY 180 tablet 3   losartan (COZAAR) 25 MG tablet TAKE 1 TABLET DAILY 90 tablet 3   Multiple Vitamins-Minerals (MULTIVITAMIN WITH MINERALS) tablet Take 1 tablet by mouth daily.     sildenafil (VIAGRA) 100 MG tablet Take 1 tablet (100 mg total) by mouth daily  as needed for erectile dysfunction. 12 tablet 5   ciclopirox (PENLAC) 8 % solution Apply topically at bedtime. Apply over nail and surrounding skin daily (Patient not taking: Reported on 07/18/2022) 6.6 mL 1   No current facility-administered medications for this encounter.    Allergies  Allergen Reactions   Procainamide Hcl Other (See Comments)     sun dermatitis   Quinidine Other (See Comments)    High fever    Social History   Socioeconomic History   Marital status: Divorced    Spouse name: Not on file   Number of children: Not on file   Years of education: Not on file   Highest education level: Not on file  Occupational History   Not on file  Tobacco Use   Smoking status: Never   Smokeless tobacco: Never  Vaping Use   Vaping Use: Never used  Substance and Sexual Activity   Alcohol use: Yes    Alcohol/week: 8.0 standard drinks of alcohol    Types: 4 Glasses of wine, 4 Shots of liquor per week    Comment: Social alcohol use   Drug use: No   Sexual activity: Not on file  Other Topics Concern   Not on file  Social History Narrative   Regular Exercise -  YES   Social Determinants of Health   Financial Resource Strain: Low Risk  (07/13/2022)   Overall Financial Resource Strain (CARDIA)    Difficulty of Paying Living Expenses: Not hard at all  Food Insecurity: No Food Insecurity (07/13/2022)   Hunger Vital Sign    Worried About Running Out of Food in the Last Year: Never true    Ran Out of Food in the Last Year: Never true  Transportation Needs: No Transportation Needs (07/13/2022)   PRAPARE - Hydrologist (Medical): No    Lack of Transportation (Non-Medical): No  Physical Activity: Sufficiently Active (07/13/2022)   Exercise Vital Sign    Days of Exercise per Week: 5 days    Minutes of Exercise per Session: 30 min  Stress: No Stress Concern Present (07/13/2022)   [place]     Feeling of Stress : Not at all  Social Connections: [place] (07/13/2022)   Social Connection and Isolation Panel [NHANES]    Frequency of Communication with Friends and Family: More than three times a week    Frequency of Social Gatherings with Friends and Family: More than three times a week    Attends Religious Services: More than 4 times per year    Active Member of [employer] or Organizations: Yes    Attends Archivist Meetings: More than 4 times per year    Marital Status: Living with partner  Intimate Partner Violence: Not At Risk (07/13/2022)  Humiliation, Afraid, Rape, and Kick questionnaire    Fear of Current or Ex-Partner: No    Emotionally Abused: No    Physically Abused: No    Sexually Abused: No    Family History  Problem Relation Age of Onset   Arrhythmia Mother    Atrial fibrillation Mother    Cancer Father    Lung cancer Other    Prostate cancer Other    Colon cancer Paternal Uncle    Rectal cancer Neg Hx    Stomach cancer Neg Hx     ROS- All systems are reviewed and negative except as per the HPI above  Physical Exam: Vitals:   07/18/22 1055  BP: 138/82  Pulse: 90  Weight: 78.7 kg  Height: '5\' 8"'$  (1.727 m)   Wt Readings from Last 3 Encounters:  07/18/22 78.7 kg  07/13/22 76.2 kg  07/03/22 78.7 kg    Labs: Lab Results  Component Value Date   NA 138 07/03/2022   K 4.4 07/03/2022   CL 102 07/03/2022   CO2 30 07/03/2022   GLUCOSE 86 07/03/2022   BUN 21 07/03/2022   CREATININE 1.16 07/03/2022   CALCIUM 8.6 07/03/2022   Lab Results  Component Value Date   INR 0.95 02/12/2011   Lab Results  Component Value Date   CHOL 197 07/03/2022   HDL 41.40 07/03/2022   LDLCALC 130 (H) 07/03/2022   TRIG 132.0 07/03/2022     GEN- The patient is well appearing, alert and oriented x 3 today.   Head- normocephalic, atraumatic Eyes-  Sclera clear, conjunctiva pink Ears- hearing intact Oropharynx- clear Neck- supple, no JVP Lymph- no  cervical lymphadenopathy Lungs- Clear to ausculation bilaterally, normal work of breathing Heart- Regular rate and rhythm, no murmurs, rubs or gallops, PMI not laterally displaced GI- soft, NT, ND, + BS Extremities- no clubbing, cyanosis, or edema MS- no significant deformity or atrophy Skin- no rash or lesion Psych- euthymic mood, full affect Neuro- strength and sensation are intact  EKG-Vent. rate 90 BPM PR interval * ms QRS duration 130 ms QT/QTcB 382/467 ms P-R-T axes 253 -16 55 Atrial flutter with variable A-V block Non-specific intra-ventricular conduction block Cannot rule out Septal infarct , age undetermined Abnormal ECG When compared with ECG of 17-Jul-2016 09:15, PREVIOUS ECG IS PRESENT    Assessment and Plan:  1. Afib Increase in burden  No specific trigger Try to split atenolol to 25 mg bid for better coverage Continue flecainide 100 mg bid  Update echo   I will see back in 2 weeks  I will go ahead and refer to EP to reestablish with being a prior pt of Dr. Rayann Heman  in case ablation needs to be pursued   Butch Penny C. Ski Polich, [facility] [address] [place], [place] [address] [phone]

## 2022-07-20 ENCOUNTER — Encounter: Payer: Self-pay | Admitting: Internal Medicine

## 2022-07-24 ENCOUNTER — Ambulatory Visit (HOSPITAL_COMMUNITY)
Admission: RE | Admit: 2022-07-24 | Discharge: 2022-07-24 | Disposition: A | Discharge status: Home / Self Care | Payer: 59 | Source: Ambulatory Visit | Attending: Internal Medicine | Admitting: Internal Medicine

## 2022-07-24 DIAGNOSIS — I4891 Unspecified atrial fibrillation: Secondary | ICD-10-CM | POA: Diagnosis present

## 2022-07-24 DIAGNOSIS — E785 Hyperlipidemia, unspecified: Secondary | ICD-10-CM | POA: Diagnosis not present

## 2022-07-24 DIAGNOSIS — I351 Nonrheumatic aortic (valve) insufficiency: Secondary | ICD-10-CM | POA: Insufficient documentation

## 2022-07-24 DIAGNOSIS — I10 Essential (primary) hypertension: Secondary | ICD-10-CM | POA: Diagnosis not present

## 2022-07-24 LAB — ECHOCARDIOGRAM COMPLETE
AR max vel: 2.8 cm2
AV Peak grad: 4.9 mmHg
Ao pk vel: 1.11 m/s
P 1/2 time: 577 msec
S' Lateral: 4.6 cm

## 2022-07-25 ENCOUNTER — Encounter (HOSPITAL_COMMUNITY): Payer: Self-pay | Admitting: *Deleted

## 2022-08-10 ENCOUNTER — Encounter: Payer: Self-pay | Admitting: Cardiovascular Disease

## 2022-08-10 ENCOUNTER — Ambulatory Visit: Payer: 59 | Attending: Cardiovascular Disease | Admitting: Cardiovascular Disease

## 2022-08-10 VITALS — BP 126/84 | HR 93 | Ht 68.0 in | Wt 175.0 lb

## 2022-08-10 DIAGNOSIS — I480 Paroxysmal atrial fibrillation: Secondary | ICD-10-CM

## 2022-08-10 MED ORDER — ATENOLOL 25 MG PO TABS: 25.0000 mg | ORAL_TABLET | Freq: Two times a day (BID) | ORAL | 3 refills | Status: DC

## 2022-08-10 NOTE — Progress Notes (Signed)
Cardiology Office Note:    Date:  08/10/2022   ID:  Evan Farmer, DOB [birth year], MRN [id number]  PCP:  Cassandria Anger, MD   [place] Providers Cardiologist:  None     Referring MD: Cassandria Anger, MD   Chief complaint:   History of Present Illness:    Evan Farmer is a 71 y.o. male with a hx of HTN, HLD, TIA atrial fibrillation and flutter referred for rhythm management. He is a former patient of Dr. Rayann Heman.  He has a long history of arrhythmia as documented below.  Briefly, he was first diagnosed with some sort of rapid arrhythmia and 1981 or 82.  His rhythm was well controlled for years with flecainide.  He believes he underwent an ablation at [facility] in 1992.  After that, he believes he remained arrhythmia free for years on flecainide.  Within the past year he began to have occasional episodes, and his atenolol dose was adjusted for rate control.  He believes he went into A-fib about 4 to 5 weeks ago and has remained in it since.  His symptoms are fairly mild, but he does have increased fatigue and shortness of breath when in atrial fibrillation.  He denies any exertional chest pain, syncope or presyncope, ankle edema, supine dyspnea.  Arrhythmia History: First episodes of arrhythmia: occurred in the early 80's. He reports his HR was about 180 bpm. He was treated with antiarrhythmic medication but is not certain of the name. 1987: had recurrence of arrhythmia. He believes it was AF. Flecainide was started, per his recollection, in the mid-80's 1992: per patient, he had an ablation at [facility] 06/2022: AF recurred and became persistent.  07/18/2022: Seen in AF clinic. ECG showed atrial flutter   Past Medical History:  Diagnosis Date   Anxiety    Cataract    Depression    Diverticulosis    HTN (hypertension)    Hyperlipidemia    Paroxysmal A-fib ([place])    Prostate cancer [facility])    2010 Dr Terance Hart    [facility] spotted fever    age 61    S/P cardiac cath 02/2011   No obstructive disease. Normal LV function   Scoliosis    TIA (transient ischemic attack)    2012   Tubular adenoma of colon    Vitamin B 12 deficiency    Vitamin D deficiency    Weight gain     Past Surgical History:  Procedure Laterality Date   CARDIOVERSION N/A 07/10/2016   Procedure: CARDIOVERSION;  Surgeon: Lelon Perla, MD;  Location: [place];  Service: Cardiovascular;  Laterality: N/A;   CATARACT EXTRACTION W/ INTRAOCULAR LENS  IMPLANT, BILATERAL     COLONOSCOPY  01-24-2005   TICS ONLY    COLONOSCOPY WITH PROPOFOL N/A 12/19/2021   Procedure: COLONOSCOPY WITH PROPOFOL;  Surgeon: Jerene Bears, MD;  [facility];  Service: Gastroenterology;  Laterality: N/A;   ENDOSCOPIC MUCOSAL RESECTION N/A 12/19/2021   Procedure: ENDOSCOPIC MUCOSAL RESECTION;  Surgeon: Jerene Bears, MD;  [facility];  Service: Gastroenterology;  Laterality: N/A;   HEMOSTASIS CLIP PLACEMENT  12/19/2021   Procedure: HEMOSTASIS CLIP PLACEMENT;  Surgeon: Jerene Bears, MD;  [facility];  Service: Gastroenterology;;   [place] and 2003   twice   NASAL SEPTUM SURGERY     POLYPECTOMY  12/19/2021   Procedure: POLYPECTOMY;  Surgeon: Jerene Bears, MD;  [facility];  Service: Gastroenterology;;   PROSTATECTOMY  11/2008   Robotic    SUBMUCOSAL LIFTING INJECTION  12/19/2021   Procedure: SUBMUCOSAL LIFTING INJECTION;  Surgeon: Jerene Bears, MD;  Location[facility];  Service: Gastroenterology;;   SUBMUCOSAL TATTOO INJECTION  12/19/2021   Procedure: SUBMUCOSAL TATTOO INJECTION;  Surgeon: Jerene Bears, MD;  [facility];  Service: Gastroenterology;;   TEE WITHOUT CARDIOVERSION N/A 07/10/2016   Procedure: TRANSESOPHAGEAL ECHOCARDIOGRAM (TEE);  Surgeon: Lelon Perla, MD;  Location: [facility] ENDOSCOPY;  Service: Cardiovascular;  Laterality: N/A;    Current Medications: Current Meds  Medication Sig   ALPRAZolam  (XANAX) 0.5 MG tablet TAKE 1 TABLET TWICE A DAY  AS NEEDED FOR ANXIETY   atenolol (TENORMIN) 25 MG tablet Take 1 tablet (25 mg total) by mouth 2 (two) times daily.   Cholecalciferol (VITAMIN D3) 50 MCG (2000 UT) capsule Take 1 capsule (2,000 Units total) by mouth daily.   ciclopirox (PENLAC) 8 % solution Apply topically at bedtime. Apply over nail and surrounding skin daily   ELIQUIS 5 MG TABS tablet TAKE 1 TABLET TWICE A DAY   Multiple Vitamins-Minerals (MULTIVITAMIN WITH MINERALS) tablet Take 1 tablet by mouth daily.   sildenafil (VIAGRA) 100 MG tablet Take 1 tablet (100 mg total) by mouth daily as needed for erectile dysfunction.   [DISCONTINUED] flecainide (TAMBOCOR) 100 MG tablet TAKE 1 TABLET TWICE A DAY     Allergies:   Procainamide hcl and Quinidine   Social History   Socioeconomic History   Marital status: Divorced    Spouse name: Not on file   Number of children: Not on file   Years of education: Not on file   Highest education level: Not on file  Occupational History   Not on file  Tobacco Use   Smoking status: Never   Smokeless tobacco: Never  Vaping Use   Vaping Use: Never used  Substance and Sexual Activity   Alcohol use: Yes    Alcohol/week: 8.0 standard drinks of alcohol    Types: 4 Glasses of wine, 4 Shots of liquor per week    Comment: Social alcohol use   Drug use: No   Sexual activity: Not on file  Other Topics Concern   Not on file  Social History Narrative   Regular Exercise -  YES   Social Determinants of Health   Financial Resource Strain: Low Risk  (07/13/2022)   Overall Financial Resource Strain (CARDIA)    Difficulty of Paying Living Expenses: Not hard at all  Food Insecurity: No Food Insecurity (07/13/2022)   Hunger Vital Sign    Worried About Running Out of Food in the Last Year: Never true    Ran Out of Food in the Last Year: Never true  Transportation Needs: No Transportation Needs (07/13/2022)   PRAPARE - Radiographer, therapeutic (Medical): No    Lack of Transportation (Non-Medical): No  Physical Activity: Sufficiently Active (07/13/2022)   Exercise Vital Sign    Days of Exercise per Week: 5 days    Minutes of Exercise per Session: 30 min  Stress: No Stress Concern Present (07/13/2022)   [place]    Feeling of Stress : Not at all  Social Connections: [place] (07/13/2022)   Social Connection and Isolation Panel [NHANES]    Frequency of Communication with Friends and Family: More than three times a week    Frequency of Social Gatherings  with Friends and Family: More than three times a week    Attends Religious Services: More than 4 times per year    Active Member of Clubs or Organizations: Yes    Attends Music therapist: More than 4 times per year    Marital Status: Living with partner     Family History: The patient's family history includes Arrhythmia in his mother; Atrial fibrillation in his mother; Cancer in his father; Colon cancer in his paternal uncle; Lung cancer in an other family member; Prostate cancer in an other family member. There is no history of Rectal cancer or Stomach cancer.  ROS:   Please see the history of present illness.    All other systems reviewed and are negative.  EKGs/Labs/Other Studies Reviewed:    The following studies were reviewed today: TTE 2022/11/03:  1. Left ventricular ejection fraction, by estimation, is 55 to 60%. The  left ventricle has normal function. The left ventricle has no regional  wall motion abnormalities. Diastolic function indeterminant due to Afib.   2. Right ventricular systolic function is normal. The right ventricular  size is normal. There is normal pulmonary artery systolic pressure.   3. Left atrial size was moderately dilated.   4. Right atrial size was mildly dilated.   5. The mitral valve is grossly normal. Trivial mitral valve  regurgitation.    6. The aortic valve is tricuspid. There is mild calcification of the  aortic valve. There is mild thickening of the aortic valve. Aortic valve  regurgitation is mild. Aortic valve sclerosis/calcification is present,  without any evidence of aortic  stenosis.   7. Aortic dilatation noted. There is mild dilatation of the aortic root,  measuring 41 mm. There is mild dilatation of the ascending aorta,  measuring 41 mm.   8. The inferior vena cava is normal in size with greater than 50%  respiratory variability, suggesting right atrial pressure of 3 mmHg.   EKG:  EKG is  ordered today.    Orders placed or performed in visit on 08/10/22   EKG 12-Lead     Recent Labs: 01/02/2022: Hemoglobin 15.4; Platelets 140.0; TSH 1.47 07/03/2022: ALT 10; BUN 21; Creatinine, Ser 1.16; Potassium 4.4; Sodium 138    Risk Assessment/Calculations:    CHA2DS2-VASc Score = 5   This indicates a 7.2% annual risk of stroke. The patient's score is based upon: CHF History: 0 HTN History: 1 Diabetes History: 0 Stroke History: 2 (questionable TIA in 2012) Vascular Disease History: 1 (elevated calcium score) Age Score: 1 Gender Score: 0        Physical Exam:    VS:  BP 126/84   Pulse 93   Ht '5\' 8"'$  (1.727 m)   Wt 175 lb (79.4 kg)   SpO2 95%   BMI 26.61 kg/m     Wt Readings from Last 3 Encounters:  08/10/22 175 lb (79.4 kg)  07/18/22 173 lb 6.4 oz (78.7 kg)  07/13/22 168 lb (76.2 kg)     GEN:  Well nourished, well developed in no acute distress CARDIAC: Irregular rhythm, no murmurs, rubs, gallops RESPIRATORY:  Normal work of breathing MUSCULOSKELETAL: no edema    ASSESSMENT & PLAN:    In order of problems listed above:  Atrial fibrillation: with increasing burden, now persistent for the past 4-5 weeks.       - he has failed flecainide. I will have him discontinue it. I recommended AF ablation. We discussed the indication, rationale, logistics, anticipated benefits,  and potential risks of the  ablation procedure including but not limited to -- bleed at the groin access site, chest pain, damage to nearby organs such as the diaphragm, lungs, or esophagus, need for a drainage tube, or prolonged hospitalization. I explained that the risk for stroke, heart attack, need for open chest surgery, or even death is very low but not zero. He expressed understanding and wishes to proceed.      - CT scan ordered Atrial flutter: (see 07/18/22 ECG) resulting in more difficult-to-control rates. Will perform CTI line at the time of ablation. HTN: atenolol refilled. He will continue a total daily dose of '50mg'$  until the ablation HLD      Medication Adjustments/Labs and Tests Ordered: Current medicines are reviewed at length with the patient today.  Concerns regarding medicines are outlined above.  Orders Placed This Encounter  Procedures   CT CARDIAC MORPH/PULM VEIN W/CM&W/O CA SCORE   CBC w/Diff   Basic Metabolic Panel (BMET)   EKG 12-Lead   Meds ordered this encounter  Medications   atenolol (TENORMIN) 25 MG tablet    Sig: Take 1 tablet (25 mg total) by mouth 2 (two) times daily.    Dispense:  180 tablet    Refill:  3     Signed, Melida Quitter, MD  08/10/2022 10:12 AM    [place]

## 2022-08-10 NOTE — Patient Instructions (Addendum)
Medication Instructions:  Your physician has recommended you make the following change in your medication:    STOP FLECAINIDE  *If you need a refill on your cardiac medications before your next appointment, please call your pharmacy*  Lab Work: You will get lab work today:  CBC and BMP  If you have labs (blood work) drawn today and your tests are completely normal, you will receive your results only by: [place] (if you have MyChart) OR A paper copy in the mail If you have any lab test that is abnormal or we need to change your treatment, we will call you to review the results.  Testing/Procedures: Your physician has requested that you have cardiac CT. Cardiac computed tomography (CT) is a painless test that uses an x-ray machine to take clear, detailed pictures of your heart.   Your physician has recommended that you have an ablation. Catheter ablation is a medical procedure used to treat some cardiac arrhythmias (irregular heartbeats). During catheter ablation, a long, thin, flexible tube is put into a blood vessel in your groin (upper thigh), or neck. This tube is called an ablation catheter. It is then guided to your heart through the blood vessel. Radio frequency waves destroy small areas of heart tissue where abnormal heartbeats may cause an arrhythmia to start. Please see the instruction sheet given to you today.  Follow-Up:  SEE INSTRUCTION LETTER  Cardiac Ablation Cardiac ablation is a procedure to destroy, or ablate, a small amount of heart tissue in very specific places. The heart has many electrical connections. Sometimes these connections are abnormal and can cause the heart to beat very fast or irregularly. Ablating some of the areas that cause problems can improve the heart's rhythm or return it to normal. Ablation may be done for people who: Have Wolff-Parkinson-White syndrome. Have fast heart rhythms (tachycardia). Have taken medicines for an abnormal heart rhythm  (arrhythmia) that were not effective or caused side effects. Have a high-risk heartbeat that may be life-threatening. During the procedure, a small incision is made in the neck or the groin, and a long, thin tube (catheter) is inserted into the incision and moved to the heart. Small devices (electrodes) on the tip of the catheter will send out electrical currents. A type of X-ray (fluoroscopy) will be used to help guide the catheter and to provide images of the heart. Tell a health care provider about: Any allergies you have. All medicines you are taking, including vitamins, herbs, eye drops, creams, and over-the-counter medicines. Any problems you or family members have had with anesthetic medicines. Any blood disorders you have. Any surgeries you have had. Any medical conditions you have, such as kidney failure. Whether you are pregnant or may be pregnant. What are the risks? Generally, this is a safe procedure. However, problems may occur, including: Infection. Bruising and bleeding at the catheter insertion site. Bleeding into the chest, especially into the sac that surrounds the heart. This is a serious complication. Stroke or blood clots. Damage to nearby structures or organs. Allergic reaction to medicines or dyes. Need for a permanent pacemaker if the normal electrical system is damaged. A pacemaker is a small computer that sends electrical signals to the heart and helps your heart beat normally. The procedure not being fully effective. This may not be recognized until months later. Repeat ablation procedures are sometimes done. What happens before the procedure? Medicines Ask your health care provider about: Changing or stopping your regular medicines. This is especially important if you  are taking diabetes medicines or blood thinners. Taking medicines such as aspirin and ibuprofen. These medicines can thin your blood. Do not take these medicines unless your health care provider  tells you to take them. Taking over-the-counter medicines, vitamins, herbs, and supplements. General instructions Follow instructions from your health care provider about eating or drinking restrictions. Plan to have someone take you home from the hospital or clinic. If you will be going home right after the procedure, plan to have someone with you for 24 hours. Ask your health care provider what steps will be taken to prevent infection. What happens during the procedure?  An IV will be inserted into one of your veins. You will be given a medicine to help you relax (sedative). The skin on your neck or groin will be numbed. An incision will be made in your neck or your groin. A needle will be inserted through the incision and into a large vein in your neck or groin. A catheter will be inserted into the needle and moved to your heart. Dye may be injected through the catheter to help your surgeon see the area of the heart that needs treatment. Electrical currents will be sent from the catheter to ablate heart tissue in desired areas. There are three types of energy that may be used to do this: Heat (radiofrequency energy). Laser energy. Extreme cold (cryoablation). When the tissue has been ablated, the catheter will be removed. Pressure will be held on the insertion area to prevent a lot of bleeding. A bandage (dressing) will be placed over the insertion area. The exact procedure may vary among health care providers and hospitals. What happens after the procedure? Your blood pressure, heart rate, breathing rate, and blood oxygen level will be monitored until you leave the hospital or clinic. Your insertion area will be monitored for bleeding. You will need to lie still for a few hours to ensure that you do not bleed from the insertion area. Do not drive for 24 hours or as long as told by your health care provider. Summary Cardiac ablation is a procedure to destroy, or ablate, a small amount  of heart tissue using an electrical current. This procedure can improve the heart rhythm or return it to normal. Tell your health care provider about any medical conditions you may have and all medicines you are taking to treat them. This is a safe procedure, but problems may occur. Problems may include infection, bruising, damage to nearby organs or structures, or allergic reactions to medicines. Follow your health care provider's instructions about eating and drinking before the procedure. You may also be told to change or stop some of your medicines. After the procedure, do not drive for 24 hours or as long as told by your health care provider. This information is not intended to replace advice given to you by your health care provider. Make sure you discuss any questions you have with your health care provider. Document Revised: 01/19/2022 Document Reviewed: 09/07/2019 Elsevier Patient Education  [place].

## 2022-08-10 NOTE — H&P (View-Only) (Signed)
Cardiology Office Note:    Date:  08/10/2022   ID:  CALYB MCQUARRIE, DOB [birth year], MRN [id number]  PCP:  Cassandria Anger, MD   [facility] Providers Cardiologist:  None     Referring MD: Cassandria Anger, MD   Chief complaint:   History of Present Illness:    Evan Farmer is a 71 y.o. male with a hx of HTN, HLD, TIA atrial fibrillation and flutter referred for rhythm management. He is a former patient of Dr. Rayann Heman.  He has a long history of arrhythmia as documented below.  Briefly, he was first diagnosed with some sort of rapid arrhythmia and 1981 or 82.  His rhythm was well controlled for years with flecainide.  He believes he underwent an ablation at [facility] in 1992.  After that, he believes he remained arrhythmia free for years on flecainide.  Within the past year he began to have occasional episodes, and his atenolol dose was adjusted for rate control.  He believes he went into A-fib about 4 to 5 weeks ago and has remained in it since.  His symptoms are fairly mild, but he does have increased fatigue and shortness of breath when in atrial fibrillation.  He denies any exertional chest pain, syncope or presyncope, ankle edema, supine dyspnea.  Arrhythmia History: First episodes of arrhythmia: occurred in the early 80's. He reports his HR was about 180 bpm. He was treated with antiarrhythmic medication but is not certain of the name. 1987: had recurrence of arrhythmia. He believes it was AF. Flecainide was started, per his recollection, in the mid-80's 1992: per patient, he had an ablation at [facility] 06/2022: AF recurred and became persistent.  07/18/2022: Seen in AF clinic. ECG showed atrial flutter   Past Medical History:  Diagnosis Date   Anxiety    Cataract    Depression    Diverticulosis    HTN (hypertension)    Hyperlipidemia    Paroxysmal A-fib ([place])    Prostate cancer [facility])    2010 Dr Terance Hart    [facility] spotted fever    age 42    S/P cardiac cath 02/2011   No obstructive disease. Normal LV function   Scoliosis    TIA (transient ischemic attack)    2012   Tubular adenoma of colon    Vitamin B 12 deficiency    Vitamin D deficiency    Weight gain     Past Surgical History:  Procedure Laterality Date   CARDIOVERSION N/A 07/10/2016   Procedure: CARDIOVERSION;  Surgeon: Lelon Perla, MD;  Location: [place];  Service: Cardiovascular;  Laterality: N/A;   CATARACT EXTRACTION W/ INTRAOCULAR LENS  IMPLANT, BILATERAL     COLONOSCOPY  01-24-2005   TICS ONLY    COLONOSCOPY WITH PROPOFOL N/A 12/19/2021   Procedure: COLONOSCOPY WITH PROPOFOL;  Surgeon: Jerene Bears, MD;  Location: [facility];  Service: Gastroenterology;  Laterality: N/A;   ENDOSCOPIC MUCOSAL RESECTION N/A 12/19/2021   Procedure: ENDOSCOPIC MUCOSAL RESECTION;  Surgeon: Jerene Bears, MD;  Location: [facility];  Service: Gastroenterology;  Laterality: N/A;   HEMOSTASIS CLIP PLACEMENT  12/19/2021   Procedure: HEMOSTASIS CLIP PLACEMENT;  Surgeon: Jerene Bears, MD;  Location: [facility];  Service: Gastroenterology;;   [place] and 2003   twice   NASAL SEPTUM SURGERY     POLYPECTOMY  12/19/2021   Procedure: POLYPECTOMY;  Surgeon: Jerene Bears, MD;  Location: [facility];  Service: Gastroenterology;;   PROSTATECTOMY  11/2008   Robotic    SUBMUCOSAL LIFTING INJECTION  12/19/2021   Procedure: SUBMUCOSAL LIFTING INJECTION;  Surgeon: Jerene Bears, MD;  Location: [facility];  Service: Gastroenterology;;   SUBMUCOSAL TATTOO INJECTION  12/19/2021   Procedure: SUBMUCOSAL TATTOO INJECTION;  Surgeon: Jerene Bears, MD;  Location: [facility];  Service: Gastroenterology;;   TEE WITHOUT CARDIOVERSION N/A 07/10/2016   Procedure: TRANSESOPHAGEAL ECHOCARDIOGRAM (TEE);  Surgeon: Lelon Perla, MD;  Location: [facility] ENDOSCOPY;  Service: Cardiovascular;  Laterality: N/A;    Current Medications: Current Meds  Medication Sig   ALPRAZolam  (XANAX) 0.5 MG tablet TAKE 1 TABLET TWICE A DAY  AS NEEDED FOR ANXIETY   atenolol (TENORMIN) 25 MG tablet Take 1 tablet (25 mg total) by mouth 2 (two) times daily.   Cholecalciferol (VITAMIN D3) 50 MCG (2000 UT) capsule Take 1 capsule (2,000 Units total) by mouth daily.   ciclopirox (PENLAC) 8 % solution Apply topically at bedtime. Apply over nail and surrounding skin daily   ELIQUIS 5 MG TABS tablet TAKE 1 TABLET TWICE A DAY   Multiple Vitamins-Minerals (MULTIVITAMIN WITH MINERALS) tablet Take 1 tablet by mouth daily.   sildenafil (VIAGRA) 100 MG tablet Take 1 tablet (100 mg total) by mouth daily as needed for erectile dysfunction.   [DISCONTINUED] flecainide (TAMBOCOR) 100 MG tablet TAKE 1 TABLET TWICE A DAY     Allergies:   Procainamide hcl and Quinidine   Social History   Socioeconomic History   Marital status: Divorced    Spouse name: Not on file   Number of children: Not on file   Years of education: Not on file   Highest education level: Not on file  Occupational History   Not on file  Tobacco Use   Smoking status: Never   Smokeless tobacco: Never  Vaping Use   Vaping Use: Never used  Substance and Sexual Activity   Alcohol use: Yes    Alcohol/week: 8.0 standard drinks of alcohol    Types: 4 Glasses of wine, 4 Shots of liquor per week    Comment: Social alcohol use   Drug use: No   Sexual activity: Not on file  Other Topics Concern   Not on file  Social History Narrative   Regular Exercise -  YES   Social Determinants of Health   Financial Resource Strain: Low Risk  (07/13/2022)   Overall Financial Resource Strain (CARDIA)    Difficulty of Paying Living Expenses: Not hard at all  Food Insecurity: No Food Insecurity (07/13/2022)   Hunger Vital Sign    Worried About Running Out of Food in the Last Year: Never true    Ran Out of Food in the Last Year: Never true  Transportation Needs: No Transportation Needs (07/13/2022)   PRAPARE - Radiographer, therapeutic (Medical): No    Lack of Transportation (Non-Medical): No  Physical Activity: Sufficiently Active (07/13/2022)   Exercise Vital Sign    Days of Exercise per Week: 5 days    Minutes of Exercise per Session: 30 min  Stress: No Stress Concern Present (07/13/2022)   [place]    Feeling of Stress : Not at all  Social Connections: [place] (07/13/2022)   Social Connection and Isolation Panel [NHANES]    Frequency of Communication with Friends and Family: More than three times a week    Frequency of Social Gatherings  with Friends and Family: More than three times a week    Attends Religious Services: More than 4 times per year    Active Member of Clubs or Organizations: Yes    Attends Music therapist: More than 4 times per year    Marital Status: Living with partner     Family History: The patient's family history includes Arrhythmia in his mother; Atrial fibrillation in his mother; Cancer in his father; Colon cancer in his paternal uncle; Lung cancer in an other family member; Prostate cancer in an other family member. There is no history of Rectal cancer or Stomach cancer.  ROS:   Please see the history of present illness.    All other systems reviewed and are negative.  EKGs/Labs/Other Studies Reviewed:    The following studies were reviewed today: TTE 04-Nov-2022:  1. Left ventricular ejection fraction, by estimation, is 55 to 60%. The  left ventricle has normal function. The left ventricle has no regional  wall motion abnormalities. Diastolic function indeterminant due to Afib.   2. Right ventricular systolic function is normal. The right ventricular  size is normal. There is normal pulmonary artery systolic pressure.   3. Left atrial size was moderately dilated.   4. Right atrial size was mildly dilated.   5. The mitral valve is grossly normal. Trivial mitral valve  regurgitation.    6. The aortic valve is tricuspid. There is mild calcification of the  aortic valve. There is mild thickening of the aortic valve. Aortic valve  regurgitation is mild. Aortic valve sclerosis/calcification is present,  without any evidence of aortic  stenosis.   7. Aortic dilatation noted. There is mild dilatation of the aortic root,  measuring 41 mm. There is mild dilatation of the ascending aorta,  measuring 41 mm.   8. The inferior vena cava is normal in size with greater than 50%  respiratory variability, suggesting right atrial pressure of 3 mmHg.   EKG:  EKG is  ordered today.    Orders placed or performed in visit on 08/10/22   EKG 12-Lead     Recent Labs: 01/02/2022: Hemoglobin 15.4; Platelets 140.0; TSH 1.47 07/03/2022: ALT 10; BUN 21; Creatinine, Ser 1.16; Potassium 4.4; Sodium 138    Risk Assessment/Calculations:    CHA2DS2-VASc Score = 5   This indicates a 7.2% annual risk of stroke. The patient's score is based upon: CHF History: 0 HTN History: 1 Diabetes History: 0 Stroke History: 2 (questionable TIA in 2012) Vascular Disease History: 1 (elevated calcium score) Age Score: 1 Gender Score: 0        Physical Exam:    VS:  BP 126/84   Pulse 93   Ht '5\' 8"'$  (1.727 m)   Wt 175 lb (79.4 kg)   SpO2 95%   BMI 26.61 kg/m     Wt Readings from Last 3 Encounters:  08/10/22 175 lb (79.4 kg)  07/18/22 173 lb 6.4 oz (78.7 kg)  07/13/22 168 lb (76.2 kg)     GEN:  Well nourished, well developed in no acute distress CARDIAC: Irregular rhythm, no murmurs, rubs, gallops RESPIRATORY:  Normal work of breathing MUSCULOSKELETAL: no edema    ASSESSMENT & PLAN:    In order of problems listed above:  Atrial fibrillation: with increasing burden, now persistent for the past 4-5 weeks.       - he has failed flecainide. I will have him discontinue it. I recommended AF ablation. We discussed the indication, rationale, logistics, anticipated benefits,  and potential risks of the  ablation procedure including but not limited to -- bleed at the groin access site, chest pain, damage to nearby organs such as the diaphragm, lungs, or esophagus, need for a drainage tube, or prolonged hospitalization. I explained that the risk for stroke, heart attack, need for open chest surgery, or even death is very low but not zero. He expressed understanding and wishes to proceed.      - CT scan ordered Atrial flutter: (see 07/18/22 ECG) resulting in more difficult-to-control rates. Will perform CTI line at the time of ablation. HTN: atenolol refilled. He will continue a total daily dose of '50mg'$  until the ablation HLD      Medication Adjustments/Labs and Tests Ordered: Current medicines are reviewed at length with the patient today.  Concerns regarding medicines are outlined above.  Orders Placed This Encounter  Procedures   CT CARDIAC MORPH/PULM VEIN W/CM&W/O CA SCORE   CBC w/Diff   Basic Metabolic Panel (BMET)   EKG 12-Lead   Meds ordered this encounter  Medications   atenolol (TENORMIN) 25 MG tablet    Sig: Take 1 tablet (25 mg total) by mouth 2 (two) times daily.    Dispense:  180 tablet    Refill:  3     Signed, Melida Quitter, MD  08/10/2022 10:12 AM    [place]

## 2022-08-11 LAB — CBC WITH DIFFERENTIAL/PLATELET
Basophils Absolute: 0 10*3/uL (ref 0.0–0.2)
Basos: 1 %
EOS (ABSOLUTE): 0 10*3/uL (ref 0.0–0.4)
Eos: 1 %
Hematocrit: 47.5 % (ref 37.5–51.0)
Hemoglobin: 16.5 g/dL (ref 13.0–17.7)
Immature Grans (Abs): 0 10*3/uL (ref 0.0–0.1)
Immature Granulocytes: 0 %
Lymphocytes Absolute: 0.8 10*3/uL (ref 0.7–3.1)
Lymphs: 21 %
MCH: 34.1 pg — ABNORMAL HIGH (ref 26.6–33.0)
MCHC: 34.7 g/dL (ref 31.5–35.7)
MCV: 98 fL — ABNORMAL HIGH (ref 79–97)
Monocytes Absolute: 0.3 10*3/uL (ref 0.1–0.9)
Monocytes: 8 %
Neutrophils Absolute: 2.6 10*3/uL (ref 1.4–7.0)
Neutrophils: 69 %
Platelets: 148 10*3/uL — ABNORMAL LOW (ref 150–450)
RBC: 4.84 x10E6/uL (ref 4.14–5.80)
RDW: 12.3 % (ref 11.6–15.4)
WBC: 3.8 10*3/uL (ref 3.4–10.8)

## 2022-08-11 LAB — BASIC METABOLIC PANEL
BUN/Creatinine Ratio: 12 (ref 10–24)
BUN: 15 mg/dL (ref 8–27)
CO2: 29 mmol/L (ref 20–29)
Calcium: 9 mg/dL (ref 8.6–10.2)
Chloride: 101 mmol/L (ref 96–106)
Creatinine, Ser: 1.21 mg/dL (ref 0.76–1.27)
Glucose: 90 mg/dL (ref 70–99)
Potassium: 4.8 mmol/L (ref 3.5–5.2)
Sodium: 141 mmol/L (ref 134–144)
eGFR: 65 mL/min/{1.73_m2} (ref >59)

## 2022-08-15 ENCOUNTER — Telehealth (HOSPITAL_COMMUNITY): Payer: Self-pay | Admitting: *Deleted

## 2022-08-15 NOTE — Telephone Encounter (Signed)
Reaching out to patient to offer assistance regarding upcoming cardiac imaging study; pt verbalizes understanding of appt date/time, parking situation and where to check in, and verified current allergies; name and call back number provided for further questions should they arise  Gordy Clement RN Navigator Cardiac Imaging [facility] [phone] office [phone] cell  Patient aware to arrive at 7:45am to [place].

## 2022-08-16 ENCOUNTER — Ambulatory Visit (HOSPITAL_BASED_OUTPATIENT_CLINIC_OR_DEPARTMENT_OTHER)
Admission: RE | Admit: 2022-08-16 | Discharge: 2022-08-16 | Disposition: A | Discharge status: Home / Self Care | Payer: 59 | Source: Ambulatory Visit | Attending: Cardiovascular Disease | Admitting: Cardiovascular Disease

## 2022-08-16 DIAGNOSIS — I480 Paroxysmal atrial fibrillation: Secondary | ICD-10-CM | POA: Insufficient documentation

## 2022-08-16 MED ORDER — IOHEXOL 350 MG/ML SOLN
100.0000 mL | Freq: Once | INTRAVENOUS | Status: AC | PRN
  Administered 2022-08-16: 80 mL via INTRAVENOUS

## 2022-08-21 NOTE — Pre-Procedure Instructions (Signed)
Instructed patient on the following items: Arrival time 0630 Nothing to eat or drink after midnight No meds AM of procedure Responsible person to drive you home and stay with you for 24 hrs  Have you missed any doses of anti-coagulant Eliquis- hasn't missed any doses.   

## 2022-08-21 NOTE — Anesthesia Preprocedure Evaluation (Signed)
Anesthesia Evaluation  Patient identified by MRN, date of birth, ID band Patient awake    Reviewed: Allergy & Precautions, NPO status , Patient's Chart, lab work & pertinent test results  Airway Mallampati: II  TM Distance: >3 FB Neck ROM: Full  Mouth opening: Limited Mouth Opening  Dental  (+) Dental Advisory Given, Poor Dentition, Missing, Caps   Pulmonary neg pulmonary ROS,    breath sounds clear to auscultation       Cardiovascular hypertension, Pt. on home beta blockers and Pt. on medications + CAD  + dysrhythmias Atrial Fibrillation  Rhythm:Regular Rate:Normal     Neuro/Psych PSYCHIATRIC DISORDERS Anxiety Depression TIA   GI/Hepatic negative GI ROS, Neg liver ROS,   Endo/Other  negative endocrine ROS  Renal/GU negative Renal ROS     Musculoskeletal   Abdominal   Peds  Hematology negative hematology ROS (+)   Anesthesia Other Findings   Reproductive/Obstetrics                            Anesthesia Physical  Anesthesia Plan  ASA: 3  Anesthesia Plan: General   Post-op Pain Management: Minimal or no pain anticipated   Induction: Intravenous  PONV Risk Score and Plan: 2 and Treatment may vary due to age or medical condition, Ondansetron and Dexamethasone  Airway Management Planned: Oral ETT  Additional Equipment: None  Intra-op Plan:   Post-operative Plan: Extubation in OR  Informed Consent: I have reviewed the patients History and Physical, chart, labs and discussed the procedure including the risks, benefits and alternatives for the proposed anesthesia with the patient or authorized representative who has indicated his/her understanding and acceptance.       Plan Discussed with: Anesthesiologist and CRNA  Anesthesia Plan Comments: (  )        Anesthesia Quick Evaluation

## 2022-08-22 ENCOUNTER — Ambulatory Visit (HOSPITAL_COMMUNITY): Payer: 59 | Admitting: Anesthesiology

## 2022-08-22 ENCOUNTER — Encounter (HOSPITAL_COMMUNITY)
Admission: RE | Disposition: A | Discharge status: Home / Self Care | Payer: Self-pay | Source: Ambulatory Visit | Attending: Cardiovascular Disease

## 2022-08-22 ENCOUNTER — Ambulatory Visit (HOSPITAL_BASED_OUTPATIENT_CLINIC_OR_DEPARTMENT_OTHER): Payer: 59 | Admitting: Anesthesiology

## 2022-08-22 ENCOUNTER — Ambulatory Visit (HOSPITAL_COMMUNITY)
Admission: RE | Admit: 2022-08-22 | Discharge: 2022-08-22 | Disposition: A | Discharge status: Home / Self Care | Payer: 59 | Source: Ambulatory Visit | Attending: Cardiovascular Disease | Admitting: Cardiovascular Disease

## 2022-08-22 ENCOUNTER — Other Ambulatory Visit: Payer: Self-pay

## 2022-08-22 ENCOUNTER — Other Ambulatory Visit (HOSPITAL_COMMUNITY): Payer: Self-pay

## 2022-08-22 DIAGNOSIS — I4819 Other persistent atrial fibrillation: Secondary | ICD-10-CM

## 2022-08-22 DIAGNOSIS — I2510 Atherosclerotic heart disease of native coronary artery without angina pectoris: Secondary | ICD-10-CM | POA: Diagnosis not present

## 2022-08-22 DIAGNOSIS — I4892 Unspecified atrial flutter: Secondary | ICD-10-CM | POA: Insufficient documentation

## 2022-08-22 DIAGNOSIS — I10 Essential (primary) hypertension: Secondary | ICD-10-CM

## 2022-08-22 DIAGNOSIS — Z79899 Other long term (current) drug therapy: Secondary | ICD-10-CM | POA: Diagnosis not present

## 2022-08-22 DIAGNOSIS — Z8673 Personal history of transient ischemic attack (TIA), and cerebral infarction without residual deficits: Secondary | ICD-10-CM | POA: Diagnosis not present

## 2022-08-22 DIAGNOSIS — E785 Hyperlipidemia, unspecified: Secondary | ICD-10-CM | POA: Insufficient documentation

## 2022-08-22 DIAGNOSIS — I483 Typical atrial flutter: Secondary | ICD-10-CM | POA: Diagnosis not present

## 2022-08-22 HISTORY — PX: ATRIAL FIBRILLATION ABLATION: EP1191

## 2022-08-22 SURGERY — ATRIAL FIBRILLATION ABLATION
Anesthesia: General

## 2022-08-22 MED ORDER — ONDANSETRON HCL 4 MG/2ML IJ SOLN: 4.0000 mg | Freq: Four times a day (QID) | INTRAMUSCULAR | Status: DC | PRN

## 2022-08-22 MED ORDER — HEPARIN SODIUM (PORCINE) 1000 UNIT/ML IJ SOLN
INTRAMUSCULAR | Status: DC | PRN
  Administered 2022-08-22: 5000 [IU] via INTRAVENOUS
  Administered 2022-08-22 (×2): 2000 [IU] via INTRAVENOUS
  Administered 2022-08-22: 12000 [IU] via INTRAVENOUS

## 2022-08-22 MED ORDER — SODIUM CHLORIDE 0.9 % IV SOLN: 250.0000 mL | INTRAVENOUS | Status: DC | PRN

## 2022-08-22 MED ORDER — HEPARIN (PORCINE) IN NACL 1000-0.9 UT/500ML-% IV SOLN
INTRAVENOUS | Status: AC
  Filled 2022-08-22: qty 500

## 2022-08-22 MED ORDER — DOBUTAMINE INFUSION FOR EP/ECHO/NUC (1000 MCG/ML)
INTRAVENOUS | Status: AC
  Filled 2022-08-22: qty 250

## 2022-08-22 MED ORDER — ACETAMINOPHEN 325 MG PO TABS: 650.0000 mg | ORAL_TABLET | ORAL | Status: DC | PRN

## 2022-08-22 MED ORDER — MIDAZOLAM HCL 2 MG/2ML IJ SOLN
INTRAMUSCULAR | Status: DC | PRN
  Administered 2022-08-22: 2 mg via INTRAVENOUS

## 2022-08-22 MED ORDER — HEPARIN SODIUM (PORCINE) 1000 UNIT/ML IJ SOLN
INTRAMUSCULAR | Status: DC | PRN
  Administered 2022-08-22: 1000 [IU] via INTRAVENOUS

## 2022-08-22 MED ORDER — ONDANSETRON HCL 4 MG/2ML IJ SOLN
INTRAMUSCULAR | Status: DC | PRN
  Administered 2022-08-22: 4 mg via INTRAVENOUS

## 2022-08-22 MED ORDER — FENTANYL CITRATE (PF) 250 MCG/5ML IJ SOLN
INTRAMUSCULAR | Status: DC | PRN
  Administered 2022-08-22: 50 ug via INTRAVENOUS
  Administered 2022-08-22: 100 ug via INTRAVENOUS
  Administered 2022-08-22: 50 ug via INTRAVENOUS

## 2022-08-22 MED ORDER — LACTATED RINGERS IV SOLN: INTRAVENOUS | Status: DC | PRN

## 2022-08-22 MED ORDER — PROPOFOL 10 MG/ML IV BOLUS
INTRAVENOUS | Status: DC | PRN
  Administered 2022-08-22: 20 mg via INTRAVENOUS
  Administered 2022-08-22: 140 mg via INTRAVENOUS

## 2022-08-22 MED ORDER — PHENYLEPHRINE 80 MCG/ML (10ML) SYRINGE FOR IV PUSH (FOR BLOOD PRESSURE SUPPORT)
PREFILLED_SYRINGE | INTRAVENOUS | Status: DC | PRN
  Administered 2022-08-22 (×2): 80 ug via INTRAVENOUS
  Administered 2022-08-22: 160 ug via INTRAVENOUS
  Administered 2022-08-22 (×8): 80 ug via INTRAVENOUS

## 2022-08-22 MED ORDER — HEPARIN SODIUM (PORCINE) 1000 UNIT/ML IJ SOLN
INTRAMUSCULAR | Status: AC
  Filled 2022-08-22: qty 10

## 2022-08-22 MED ORDER — ROCURONIUM BROMIDE 10 MG/ML (PF) SYRINGE
PREFILLED_SYRINGE | INTRAVENOUS | Status: DC | PRN
  Administered 2022-08-22: 50 mg via INTRAVENOUS
  Administered 2022-08-22: 20 mg via INTRAVENOUS
  Administered 2022-08-22: 30 mg via INTRAVENOUS

## 2022-08-22 MED ORDER — PANTOPRAZOLE SODIUM 40 MG PO TBEC
40.0000 mg | DELAYED_RELEASE_TABLET | Freq: Every day | ORAL | 0 refills | Status: DC
  Filled 2022-08-22: qty 30, 30d supply, fill #0

## 2022-08-22 MED ORDER — SUGAMMADEX SODIUM 200 MG/2ML IV SOLN
INTRAVENOUS | Status: DC | PRN
  Administered 2022-08-22: 200 mg via INTRAVENOUS

## 2022-08-22 MED ORDER — SODIUM CHLORIDE 0.9% FLUSH: 3.0000 mL | INTRAVENOUS | Status: DC | PRN

## 2022-08-22 MED ORDER — DOBUTAMINE INFUSION FOR EP/ECHO/NUC (1000 MCG/ML)
INTRAVENOUS | Status: DC | PRN
  Administered 2022-08-22: 20 ug/kg/min via INTRAVENOUS

## 2022-08-22 MED ORDER — LIDOCAINE 2% (20 MG/ML) 5 ML SYRINGE
INTRAMUSCULAR | Status: DC | PRN
  Administered 2022-08-22: 80 mg via INTRAVENOUS

## 2022-08-22 MED ORDER — PROTAMINE SULFATE 10 MG/ML IV SOLN
INTRAVENOUS | Status: DC | PRN
  Administered 2022-08-22: 40 mg via INTRAVENOUS

## 2022-08-22 MED ORDER — PHENYLEPHRINE HCL-NACL 20-0.9 MG/250ML-% IV SOLN
INTRAVENOUS | Status: DC | PRN
  Administered 2022-08-22: 40 ug/min via INTRAVENOUS

## 2022-08-22 MED ORDER — SODIUM CHLORIDE 0.9 % IV SOLN: INTRAVENOUS | Status: DC

## 2022-08-22 MED ORDER — HEPARIN (PORCINE) IN NACL 1000-0.9 UT/500ML-% IV SOLN
INTRAVENOUS | Status: DC | PRN
  Administered 2022-08-22 (×4): 500 mL

## 2022-08-22 MED ORDER — DEXAMETHASONE SODIUM PHOSPHATE 10 MG/ML IJ SOLN
INTRAMUSCULAR | Status: DC | PRN
  Administered 2022-08-22: 5 mg via INTRAVENOUS

## 2022-08-22 SURGICAL SUPPLY — 19 items
BLANKET WARM UNDERBOD FULL ACC (MISCELLANEOUS) ×1 IMPLANT
CATH ABLAT QDOT MICRO BI TC DF (CATHETERS) IMPLANT
CATH OCTARAY 2.0 F 3-3-3-3-3 (CATHETERS) IMPLANT
CATH PIGTAIL STEERABLE D1 8.7 (WIRE) IMPLANT
CATH SOUNDSTAR ECO 8FR (CATHETERS) IMPLANT
CATH WEB BI DIR CSDF CRV REPRO (CATHETERS) IMPLANT
CLOSURE PERCLOSE PROSTYLE (VASCULAR PRODUCTS) IMPLANT
COVER SWIFTLINK CONNECTOR (BAG) ×1 IMPLANT
DEVICE CLOSURE MYNXGRIP 6/7F (Vascular Products) IMPLANT
PACK EP LATEX FREE (CUSTOM PROCEDURE TRAY) ×1
PACK EP LF (CUSTOM PROCEDURE TRAY) ×1 IMPLANT
PAD DEFIB RADIO PHYSIO CONN (PAD) ×1 IMPLANT
PATCH CARTO3 (PAD) IMPLANT
SHEATH CARTO VIZIGO SM CVD (SHEATH) IMPLANT
SHEATH PINNACLE 7F 10CM (SHEATH) IMPLANT
SHEATH PINNACLE 8F 10CM (SHEATH) IMPLANT
SHEATH PINNACLE 9F 10CM (SHEATH) IMPLANT
SHEATH PROBE COVER 6X72 (BAG) IMPLANT
TUBING SMART ABLATE COOLFLOW (TUBING) IMPLANT

## 2022-08-22 NOTE — Anesthesia Procedure Notes (Signed)
Procedure Name: Intubation Date/Time: 08/22/2022 8:46 AM  Performed by: Amadeo Garnet, CRNAPre-anesthesia Checklist: Patient identified, Emergency Drugs available, Suction available and Patient being monitored Patient Re-evaluated:Patient Re-evaluated prior to induction Oxygen Delivery Method: Circle system utilized Preoxygenation: Pre-oxygenation with 100% oxygen Induction Type: IV induction Ventilation: Mask ventilation without difficulty Laryngoscope Size: Glidescope and 4 Grade View: Grade I Tube type: Oral Tube size: 7.5 mm Number of attempts: 1 Airway Equipment and Method: Stylet and Oral airway Placement Confirmation: ETT inserted through vocal cords under direct vision, positive ETCO2 and breath sounds checked- equal and bilateral Secured at: 22 cm Tube secured with: Tape Dental Injury: Teeth and Oropharynx as per pre-operative assessment

## 2022-08-22 NOTE — Progress Notes (Signed)
Ambulated pt, right groin had small amount of blood on right groin dressing, removed dressing and no further bleeding, pt resting 10 minutes and will re assess.

## 2022-08-22 NOTE — Anesthesia Postprocedure Evaluation (Signed)
Anesthesia Post Note  Patient: Evan Farmer  Procedure(s) Performed: ATRIAL FIBRILLATION ABLATION     Patient location during evaluation: PACU Anesthesia Type: General Level of consciousness: awake Pain management: pain level controlled Vital Signs Assessment: post-procedure vital signs reviewed and stable Respiratory status: spontaneous breathing Cardiovascular status: stable Postop Assessment: no apparent nausea or vomiting Anesthetic complications: no   No notable events documented.  Last Vitals:  Vitals:   08/22/22 1250 08/22/22 1251  BP: (!) 94/46   Pulse: 70   Resp: 17   Temp:  36.7 C  SpO2: 95%     Last Pain:  Vitals:   08/22/22 1251  TempSrc: Temporal  PainSc:                  Mercedees Convery

## 2022-08-22 NOTE — Discharge Instructions (Addendum)
Post procedure care instructions No driving for 4 days. No lifting over 5 lbs for 1 week. No vigorous or sexual activity for 1 week. You may return to work/your usual activities on 08/30/22. Keep procedure site clean & dry. If you notice increased pain, swelling, bleeding or pus, call/return!  You may shower after 24 hours, but no soaking in baths/hot tubs/pools for 1 week.    You have an appointment set up with the [facility].  Multiple studies have shown that being followed by a dedicated atrial fibrillation clinic in addition to the standard care you receive from your other physicians improves health. We believe that enrollment in the atrial fibrillation clinic will allow [place] to better care for you.   The phone number to the [facility] is [phone]. The clinic is staffed Monday through Friday from 8:30am to 5pm.  Directions: The clinic is located in the [facility], [place] the hospital at the MAIN ENTRANCE "A", use [address] to the 6th floor.  Registration desk to the right of elevators on 6th floor  If you have any trouble locating the clinic, please don't hesitate to call [phone].  Cardiac Ablation, Care After  This sheet gives you information about how to care for yourself after your procedure. Your health care provider may also give you more specific instructions. If you have problems or questions, contact your health care provider. What can I expect after the procedure? After the procedure, it is common to have: Bruising around your puncture site. Tenderness around your puncture site. Skipped heartbeats. Tiredness (fatigue).  Follow these instructions at home: Puncture site care  Follow instructions from your health care provider about how to take care of your puncture site. Make sure you: If present, leave stitches (sutures), skin glue, or adhesive strips in place. These skin closures may need to stay in place for  up to 2 weeks. If adhesive strip edges start to loosen and curl up, you may trim the loose edges. Do not remove adhesive strips completely unless your health care provider tells you to do that. If a large square bandage is present, this may be removed 24 hours after surgery.  Check your puncture site every day for signs of infection. Check for: Redness, swelling, or pain. Fluid or blood. If your puncture site starts to bleed, lie down on your back, apply firm pressure to the area, and contact your health care provider. Warmth. Pus or a bad smell. A pea or small marble sized lump at the site is normal and can take up to three months to resolve.  Driving Do not drive for at least 4 days after your procedure or however long your health care provider recommends. (Do not resume driving if you have previously been instructed not to drive for other health reasons.) Do not drive or use heavy machinery while taking prescription pain medicine. Activity Avoid activities that take a lot of effort for at least 7 days after your procedure. Do not lift anything that is heavier than 5 lb (4.5 kg) for one week.  No sexual activity for 1 week.  Return to your normal activities as told by your health care provider. Ask your health care provider what activities are safe for you. General instructions Take over-the-counter and prescription medicines only as told by your health care provider. Do not use any products that contain nicotine or tobacco, such as cigarettes and e-cigarettes. If you need help quitting, ask your health care  provider. You may shower after 24 hours, but Do not take baths, swim, or use a hot tub for 1 week.  Do not drink alcohol for 24 hours after your procedure. Keep all follow-up visits as told by your health care provider. This is important. Contact a health care provider if: You have redness, mild swelling, or pain around your puncture site. You have fluid or blood coming from your  puncture site that stops after applying firm pressure to the area. Your puncture site feels warm to the touch. You have pus or a bad smell coming from your puncture site. You have a fever. You have chest pain or discomfort that spreads to your neck, jaw, or arm. You are sweating a lot. You feel nauseous. You have a fast or irregular heartbeat. You have shortness of breath. You are dizzy or light-headed and feel the need to lie down. You have pain or numbness in the arm or leg closest to your puncture site. Get help right away if: Your puncture site suddenly swells. Your puncture site is bleeding and the bleeding does not stop after applying firm pressure to the area. These symptoms may represent a serious problem that is an emergency. Do not wait to see if the symptoms will go away. Get medical help right away. Call your local emergency services (911 in the [place] Do not drive yourself to the hospital. Summary After the procedure, it is normal to have bruising and tenderness at the puncture site in your groin, neck, or forearm. Check your puncture site every day for signs of infection. Get help right away if your puncture site is bleeding and the bleeding does not stop after applying firm pressure to the area. This is a medical emergency. This information is not intended to replace advice given to you by your health care provider. Make sure you discuss any questions you have with your health care provider.

## 2022-08-22 NOTE — Transfer of Care (Signed)
Immediate Anesthesia Transfer of Care Note  Patient: Evan Farmer  Procedure(s) Performed: ATRIAL FIBRILLATION ABLATION  Patient Location: Cath Lab  Anesthesia Type:General  Level of Consciousness: awake, alert , and oriented  Airway & Oxygen Therapy: Patient Spontanous Breathing and Patient connected to nasal cannula oxygen  Post-op Assessment: Report given to RN, Post -op Vital signs reviewed and stable, and Patient moving all extremities X 4  Post vital signs: Reviewed and stable  Last Vitals:  Vitals Value Taken Time  BP 126/59 08/22/22 1219  Temp 36.8 C 08/22/22 1221  Pulse 70 08/22/22 1223  Resp 14 08/22/22 1223  SpO2 96 % 08/22/22 1223  Vitals shown include unvalidated device data.  Last Pain:  Vitals:   08/22/22 1221  TempSrc: Temporal  PainSc: 0-No pain         Complications: No notable events documented.

## 2022-08-22 NOTE — Interval H&P Note (Signed)
History and Physical Interval Note: The attatched note from 08/10/2022 is labeled a progress note but is the note from my orignial encounter with the patient and contains all the elements of an H&P.  08/22/2022 7:09 AM  [name]  has presented today for surgery, with the diagnosis of afib.  The various methods of treatment have been discussed with the patient and family. After consideration of risks, benefits and other options for treatment, the patient has consented to  Procedure(s): ATRIAL FIBRILLATION ABLATION (N/A) as a surgical intervention.  The patient's history has been reviewed, patient examined, stable for surgery.  The only change he notes is some active bowels he hasn't notice watery stools or increase in total stool volume. He attributes his symptoms to nervousness and increase in fruit intake. I have reviewed the patient's chart and labs.  Questions were answered to the patient's satisfaction.     Evan Farmer

## 2022-08-23 ENCOUNTER — Encounter (HOSPITAL_COMMUNITY): Payer: Self-pay | Admitting: Cardiovascular Disease

## 2022-08-23 LAB — POCT ACTIVATED CLOTTING TIME
Activated Clotting Time: 263 seconds
Activated Clotting Time: 299 seconds
Activated Clotting Time: 281 seconds
Activated Clotting Time: 305 seconds
Activated Clotting Time: 317 seconds

## 2022-09-20 ENCOUNTER — Encounter (HOSPITAL_COMMUNITY): Payer: Self-pay | Admitting: Nurse Practitioner

## 2022-09-20 ENCOUNTER — Ambulatory Visit (HOSPITAL_COMMUNITY)
Admit: 2022-09-20 | Discharge: 2022-09-20 | Disposition: A | Discharge status: Home / Self Care | Payer: 59 | Source: Ambulatory Visit | Attending: Nurse Practitioner | Admitting: Nurse Practitioner

## 2022-09-20 VITALS — BP 124/76 | HR 54 | Ht 68.0 in | Wt 171.2 lb

## 2022-09-20 DIAGNOSIS — D6869 Other thrombophilia: Secondary | ICD-10-CM | POA: Diagnosis not present

## 2022-09-20 DIAGNOSIS — Z79899 Other long term (current) drug therapy: Secondary | ICD-10-CM | POA: Diagnosis not present

## 2022-09-20 DIAGNOSIS — I480 Paroxysmal atrial fibrillation: Secondary | ICD-10-CM | POA: Diagnosis present

## 2022-09-20 DIAGNOSIS — I4892 Unspecified atrial flutter: Secondary | ICD-10-CM | POA: Diagnosis not present

## 2022-09-20 DIAGNOSIS — Z7901 Long term (current) use of anticoagulants: Secondary | ICD-10-CM | POA: Insufficient documentation

## 2022-09-20 NOTE — Progress Notes (Signed)
Primary Care Physician: Plotnikov, Evie Lacks, MD Referring Physician: self referred Prior EP: Dr. Carlynn Purl is a 71 y.o. male with a h/o afib on flecainide very well controlled for years. He is now in the afib clinic for increased afib burden over the last 3 weeks. He feels he is paroxysmal. He is in atrial flutter today with variable block, rate controlled. He recently;y increased his atenolol to 50 mg daily. He is not that symptomatic but picked up the higher HR on BP machine. He tried an extra 1/2 flecainide for a day or two when it first started. He has recently lost 10 lbs and is walking  or a regular basis.   He is now back in afib clinic, 09/20/22 for f/u ablation 08/22/22. He reports that he  is doing well. No swallowing or groin issues. EKG shows SR.   Today, he denies symptoms of palpitations, chest pain, shortness of breath, orthopnea, PND, lower extremity edema, dizziness, presyncope, syncope, or neurologic sequela. The patient is tolerating medications without difficulties and is otherwise without complaint today.   Past Medical History:  Diagnosis Date   Anxiety    Cataract    Depression    Diverticulosis    HTN (hypertension)    Hyperlipidemia    Paroxysmal A-fib ([place])    Prostate cancer [facility])    2010 Dr Terance Hart    [facility] spotted fever    age 55   S/P cardiac cath 02/2011   No obstructive disease. Normal LV function   Scoliosis    TIA (transient ischemic attack)    2012   Tubular adenoma of colon    Vitamin B 12 deficiency    Vitamin D deficiency    Weight gain    Past Surgical History:  Procedure Laterality Date   ATRIAL FIBRILLATION ABLATION N/A 08/22/2022   Procedure: ATRIAL FIBRILLATION ABLATION;  Surgeon: Melida Quitter, MD;  Location: [place] CV LAB;  Service: Cardiovascular;  Laterality: N/A;   CARDIOVERSION N/A 07/10/2016   Procedure: CARDIOVERSION;  Surgeon: Lelon Perla, MD;  Location: [facility] ENDOSCOPY;  Service:  Cardiovascular;  Laterality: N/A;   CATARACT EXTRACTION W/ INTRAOCULAR LENS  IMPLANT, BILATERAL     COLONOSCOPY  01-24-2005   TICS ONLY    COLONOSCOPY WITH PROPOFOL N/A 12/19/2021   Procedure: COLONOSCOPY WITH PROPOFOL;  Surgeon: Jerene Bears, MD;  Location: [facility] ENDOSCOPY;  Service: Gastroenterology;  Laterality: N/A;   ENDOSCOPIC MUCOSAL RESECTION N/A 12/19/2021   Procedure: ENDOSCOPIC MUCOSAL RESECTION;  Surgeon: Jerene Bears, MD;  Location: [facility] ENDOSCOPY;  Service: Gastroenterology;  Laterality: N/A;   HEMOSTASIS CLIP PLACEMENT  12/19/2021   Procedure: HEMOSTASIS CLIP PLACEMENT;  Surgeon: Jerene Bears, MD;  Location: [facility] ENDOSCOPY;  Service: Gastroenterology;;   [place] and 2003   twice   NASAL SEPTUM SURGERY     POLYPECTOMY  12/19/2021   Procedure: POLYPECTOMY;  Surgeon: Jerene Bears, MD;  Location: [facility] ENDOSCOPY;  Service: Gastroenterology;;   PROSTATECTOMY  11/2008   Robotic    SUBMUCOSAL LIFTING INJECTION  12/19/2021   Procedure: SUBMUCOSAL LIFTING INJECTION;  Surgeon: Jerene Bears, MD;  Location: [facility] ENDOSCOPY;  Service: Gastroenterology;;   SUBMUCOSAL TATTOO INJECTION  12/19/2021   Procedure: SUBMUCOSAL TATTOO INJECTION;  Surgeon: Jerene Bears, MD;  Location: [facility] ENDOSCOPY;  Service: Gastroenterology;;   TEE WITHOUT CARDIOVERSION N/A 07/10/2016   Procedure: TRANSESOPHAGEAL ECHOCARDIOGRAM (TEE);  Surgeon: Denice Bors  Stanford Breed, MD;  Location: [place];  Service: Cardiovascular;  Laterality: N/A;    Current Outpatient Medications  Medication Sig Dispense Refill   ALPRAZolam (XANAX) 0.5 MG tablet TAKE 1 TABLET TWICE A DAY  AS NEEDED FOR ANXIETY 180 tablet 1   atenolol (TENORMIN) 25 MG tablet Take 1 tablet (25 mg total) by mouth 2 (two) times daily. 180 tablet 3   Cholecalciferol (VITAMIN D3) 50 MCG (2000 UT) capsule Take 1 capsule (2,000 Units total) by mouth daily. 100 capsule 3   ciclopirox (PENLAC) 8 % solution Apply topically at bedtime. Apply over nail and  surrounding skin daily 6.6 mL 1   Cyanocobalamin (B-12) 1000 MCG SUBL Place 1,000 mcg under the tongue daily.     ELIQUIS 5 MG TABS tablet TAKE 1 TABLET TWICE A DAY 180 tablet 1   losartan (COZAAR) 25 MG tablet TAKE 1 TABLET DAILY 90 tablet 3   Multiple Vitamins-Minerals (MULTIVITAMIN WITH MINERALS) tablet Take 1 tablet by mouth daily.     sildenafil (VIAGRA) 100 MG tablet Take 1 tablet (100 mg total) by mouth daily as needed for erectile dysfunction. 12 tablet 5   No current facility-administered medications for this encounter.    Allergies  Allergen Reactions   Procainamide Hcl Other (See Comments)     sun dermatitis   Quinidine Other (See Comments)    High fever    Social History   Socioeconomic History   Marital status: Divorced    Spouse name: Not on file   Number of children: Not on file   Years of education: Not on file   Highest education level: Not on file  Occupational History   Not on file  Tobacco Use   Smoking status: Never   Smokeless tobacco: Never  Vaping Use   Vaping Use: Never used  Substance and Sexual Activity   Alcohol use: Yes    Alcohol/week: 8.0 standard drinks of alcohol    Types: 4 Glasses of wine, 4 Shots of liquor per week    Comment: Social alcohol use   Drug use: No   Sexual activity: Not on file  Other Topics Concern   Not on file  Social History Narrative   Regular Exercise -  YES   Social Determinants of Health   Financial Resource Strain: Low Risk  (07/13/2022)   Overall Financial Resource Strain (CARDIA)    Difficulty of Paying Living Expenses: Not hard at all  Food Insecurity: No Food Insecurity (07/13/2022)   Hunger Vital Sign    Worried About Running Out of Food in the Last Year: Never true    Ran Out of Food in the Last Year: Never true  Transportation Needs: No Transportation Needs (07/13/2022)   PRAPARE - Hydrologist (Medical): No    Lack of Transportation (Non-Medical): No  Physical Activity:  Sufficiently Active (07/13/2022)   Exercise Vital Sign    Days of Exercise per Week: 5 days    Minutes of Exercise per Session: 30 min  Stress: No Stress Concern Present (07/13/2022)   [place]    Feeling of Stress : Not at all  Social Connections: [place] (07/13/2022)   Social Connection and Isolation Panel [NHANES]    Frequency of Communication with Friends and Family: More than three times a week    Frequency of Social Gatherings with Friends and Family: More than three times a week    Attends Religious Services: More  than 4 times per year    Active Member of [place] or Organizations: Yes    Attends Archivist Meetings: More than 4 times per year    Marital Status: Living with partner  Intimate Partner Violence: Not At Risk (07/13/2022)   Humiliation, Afraid, Rape, and Kick questionnaire    Fear of Current or Ex-Partner: No    Emotionally Abused: No    Physically Abused: No    Sexually Abused: No    Family History  Problem Relation Age of Onset   Arrhythmia Mother    Atrial fibrillation Mother    Cancer Father    Lung cancer Other    Prostate cancer Other    Colon cancer Paternal Uncle    Rectal cancer Neg Hx    Stomach cancer Neg Hx     ROS- All systems are reviewed and negative except as per the HPI above  Physical Exam: Vitals:   09/20/22 1113  BP: 124/76  Pulse: (!) 54  Weight: 77.7 kg  Height: '5\' 8"'$  (1.727 m)   Wt Readings from Last 3 Encounters:  09/20/22 77.7 kg  08/22/22 77.1 kg  08/10/22 79.4 kg    Labs: Lab Results  Component Value Date   NA 141 08/10/2022   K 4.8 08/10/2022   CL 101 08/10/2022   CO2 29 08/10/2022   GLUCOSE 90 08/10/2022   BUN 15 08/10/2022   CREATININE 1.21 08/10/2022   CALCIUM 9.0 08/10/2022   Lab Results  Component Value Date   INR 0.95 02/12/2011   Lab Results  Component Value Date   CHOL 197 07/03/2022   HDL 41.40 07/03/2022    LDLCALC 130 (H) 07/03/2022   TRIG 132.0 07/03/2022     GEN- The patient is well appearing, alert and oriented x 3 today.   Head- normocephalic, atraumatic Eyes-  Sclera clear, conjunctiva pink Ears- hearing intact Oropharynx- clear Neck- supple, no JVP Lymph- no cervical lymphadenopathy Lungs- Clear to ausculation bilaterally, normal work of breathing Heart- Regular rate and rhythm, no murmurs, rubs or gallops, PMI not laterally displaced GI- soft, NT, ND, + BS Extremities- no clubbing, cyanosis, or edema MS- no significant deformity or atrophy Skin- no rash or lesion Psych- euthymic mood, full affect Neuro- strength and sensation are intact  EKG Vent. rate 54 BPM PR interval 158 ms QRS duration 86 ms QT/QTcB 408/386 ms P-R-T axes 62 16 40 Sinus bradycardia Otherwise normal ECG When compared with ECG of 22-Aug-2022 12:23, PREVIOUS ECG IS PRESENT    Assessment and Plan:  1. Afib/aflutter  S/p ablation one month ago and doing well staying in SR  Continue atenolol at current dose   2. CHA2DS2VASc  score of at least 4 Continue eliquis 5 mg bid    Geroge Baseman. Sharde Gover, [facility] [address] [place], [place] [address] [phone]

## 2022-10-31 ENCOUNTER — Other Ambulatory Visit: Payer: Self-pay

## 2022-10-31 MED ORDER — LOSARTAN POTASSIUM 25 MG PO TABS: 25.0000 mg | ORAL_TABLET | Freq: Every day | ORAL | 2 refills | Status: DC

## 2022-11-22 ENCOUNTER — Ambulatory Visit: Payer: 59 | Attending: Cardiovascular Disease | Admitting: Cardiovascular Disease

## 2022-11-22 ENCOUNTER — Encounter: Payer: Self-pay | Admitting: Cardiovascular Disease

## 2022-11-22 VITALS — BP 118/84 | HR 54 | Ht 68.0 in | Wt 176.0 lb

## 2022-11-22 DIAGNOSIS — I480 Paroxysmal atrial fibrillation: Secondary | ICD-10-CM | POA: Diagnosis not present

## 2022-11-22 NOTE — Progress Notes (Signed)
Cardiology Office Note:    Date:  11/22/2022   ID:  Evan Farmer, DOB 1951-02-15, MRN [id number]  PCP:  Cassandria Anger, MD   [place] Providers Cardiologist:  None Electrophysiologist:  Melida Quitter, MD     Referring MD: Cassandria Anger, MD   Chief complaint:   History of Present Illness:    Evan Farmer is a 72 y.o. male with a hx of HTN, HLD, TIA atrial fibrillation and flutter referred for rhythm management. He is a former patient of Dr. Rayann Heman.  He has a long history of arrhythmia as documented below.  Briefly, he was first diagnosed with some sort of rapid arrhythmia and 1981 or 82.  His rhythm was well controlled for years with flecainide.  He believes he underwent an ablation at [facility] in 1992.  After that, he believes he remained arrhythmia free for years on flecainide.  Within the past year he began to have occasional episodes, and his atenolol dose was adjusted for rate control.  He believes he went into A-fib about 4 to 5 weeks ago and has remained in it since.  His symptoms are fairly mild, but he does have increased fatigue and shortness of breath when in atrial fibrillation.  He denies any exertional chest pain, syncope or presyncope, ankle edema, supine dyspnea.  Arrhythmia History: First episodes of arrhythmia: occurred in the early 80's. He reports his HR was about 180 bpm. He was treated with antiarrhythmic medication but is not certain of the name. 1987: had recurrence of arrhythmia. He believes it was AF. Flecainide was started, per his recollection, in the mid-80's 1992: per patient, he had an ablation at [facility] 06/2022: AF recurred and became persistent.  07/18/2022: Seen in AF clinic. ECG showed atrial flutter 08/22/22: Ablation of AF and typical flutter   Past Medical History:  Diagnosis Date   Anxiety    Cataract    Depression    Diverticulosis    HTN (hypertension)    Hyperlipidemia    Paroxysmal A-fib (HCC)     Prostate cancer [facility])    2010 Dr Terance Hart    [facility] spotted fever    age 13   S/P cardiac cath 02/2011   No obstructive disease. Normal LV function   Scoliosis    TIA (transient ischemic attack)    2012   Tubular adenoma of colon    Vitamin B 12 deficiency    Vitamin D deficiency    Weight gain     Past Surgical History:  Procedure Laterality Date   ATRIAL FIBRILLATION ABLATION N/A 08/22/2022   Procedure: ATRIAL FIBRILLATION ABLATION;  Surgeon: Melida Quitter, MD;  Location: [place] CV LAB;  Service: Cardiovascular;  Laterality: N/A;   CARDIOVERSION N/A 07/10/2016   Procedure: CARDIOVERSION;  Surgeon: Lelon Perla, MD;  Location: [facility] ENDOSCOPY;  Service: Cardiovascular;  Laterality: N/A;   CATARACT EXTRACTION W/ INTRAOCULAR LENS  IMPLANT, BILATERAL     COLONOSCOPY  01-24-2005   TICS ONLY    COLONOSCOPY WITH PROPOFOL N/A 12/19/2021   Procedure: COLONOSCOPY WITH PROPOFOL;  Surgeon: Jerene Bears, MD;  Location: [facility] ENDOSCOPY;  Service: Gastroenterology;  Laterality: N/A;   ENDOSCOPIC MUCOSAL RESECTION N/A 12/19/2021   Procedure: ENDOSCOPIC MUCOSAL RESECTION;  Surgeon: Jerene Bears, MD;  Location: [facility] ENDOSCOPY;  Service: Gastroenterology;  Laterality: N/A;   HEMOSTASIS CLIP PLACEMENT  12/19/2021   Procedure: HEMOSTASIS CLIP PLACEMENT;  Surgeon: Jerene Bears, MD;  Location: [facility]  ENDOSCOPY;  Service: Gastroenterology;;   [place] and 2003   twice   NASAL SEPTUM SURGERY     POLYPECTOMY  12/19/2021   Procedure: POLYPECTOMY;  Surgeon: Jerene Bears, MD;  Location: [facility] ENDOSCOPY;  Service: Gastroenterology;;   PROSTATECTOMY  11/2008   Robotic    SUBMUCOSAL LIFTING INJECTION  12/19/2021   Procedure: SUBMUCOSAL LIFTING INJECTION;  Surgeon: Jerene Bears, MD;  Location: [facility] ENDOSCOPY;  Service: Gastroenterology;;   SUBMUCOSAL TATTOO INJECTION  12/19/2021   Procedure: SUBMUCOSAL TATTOO INJECTION;  Surgeon: Jerene Bears, MD;  Location: [facility] ENDOSCOPY;   Service: Gastroenterology;;   TEE WITHOUT CARDIOVERSION N/A 07/10/2016   Procedure: TRANSESOPHAGEAL ECHOCARDIOGRAM (TEE);  Surgeon: Lelon Perla, MD;  Location: [facility] ENDOSCOPY;  Service: Cardiovascular;  Laterality: N/A;    Current Medications: No outpatient medications have been marked as taking for the 11/22/22 encounter (Appointment) with Pavneet [place], Yetta Barre, MD.     Allergies:   Procainamide hcl and Quinidine   Social History   Socioeconomic History   Marital status: Divorced    Spouse name: Not on file   Number of children: Not on file   Years of education: Not on file   Highest education level: Not on file  Occupational History   Not on file  Tobacco Use   Smoking status: Never   Smokeless tobacco: Never  Vaping Use   Vaping Use: Never used  Substance and Sexual Activity   Alcohol use: Yes    Alcohol/week: 8.0 standard drinks of alcohol    Types: 4 Glasses of wine, 4 Shots of liquor per week    Comment: Social alcohol use   Drug use: No   Sexual activity: Not on file  Other Topics Concern   Not on file  Social History Narrative   Regular Exercise -  YES   Social Determinants of Health   Financial Resource Strain: Low Risk  (07/13/2022)   Overall Financial Resource Strain (CARDIA)    Difficulty of Paying Living Expenses: Not hard at all  Food Insecurity: No Food Insecurity (07/13/2022)   Hunger Vital Sign    Worried About Running Out of Food in the Last Year: Never true    Ran Out of Food in the Last Year: Never true  Transportation Needs: No Transportation Needs (07/13/2022)   PRAPARE - Hydrologist (Medical): No    Lack of Transportation (Non-Medical): No  Physical Activity: Sufficiently Active (07/13/2022)   Exercise Vital Sign    Days of Exercise per Week: 5 days    Minutes of Exercise per Session: 30 min  Stress: No Stress Concern Present (07/13/2022)   [place]     Feeling of Stress : Not at all  Social Connections: [place] (07/13/2022)   Social Connection and Isolation Panel [NHANES]    Frequency of Communication with Friends and Family: More than three times a week    Frequency of Social Gatherings with Friends and Family: More than three times a week    Attends Religious Services: More than 4 times per year    Active Member of [employer] or Organizations: Yes    Attends Music therapist: More than 4 times per year    Marital Status: Living with partner     Family History: The patient's family history includes Arrhythmia in his mother; Atrial fibrillation in his mother; Cancer in his father; Colon  cancer in his paternal uncle; Lung cancer in an other family member; Prostate cancer in an other family member. There is no history of Rectal cancer or Stomach cancer.  ROS:   Please see the history of present illness.    All other systems reviewed and are negative.  EKGs/Labs/Other Studies Reviewed:    The following studies were reviewed today: TTE 30-Oct-2022:  1. Left ventricular ejection fraction, by estimation, is 55 to 60%. The  left ventricle has normal function. The left ventricle has no regional  wall motion abnormalities. Diastolic function indeterminant due to Afib.   2. Right ventricular systolic function is normal. The right ventricular  size is normal. There is normal pulmonary artery systolic pressure.   3. Left atrial size was moderately dilated.   4. Right atrial size was mildly dilated.   5. The mitral valve is grossly normal. Trivial mitral valve  regurgitation.   6. The aortic valve is tricuspid. There is mild calcification of the  aortic valve. There is mild thickening of the aortic valve. Aortic valve  regurgitation is mild. Aortic valve sclerosis/calcification is present,  without any evidence of aortic  stenosis.   7. Aortic dilatation noted. There is mild dilatation of the aortic root,  measuring 41 mm.  There is mild dilatation of the ascending aorta,  measuring 41 mm.   8. The inferior vena cava is normal in size with greater than 50%  respiratory variability, suggesting right atrial pressure of 3 mmHg.   EKG:  EKG is  ordered today.    Orders placed or performed during the hospital encounter of 09/20/22   EKG 12-Lead   EKG 12-Lead     Recent Labs: 01/02/2022: TSH 1.47 07/03/2022: ALT 10 08/10/2022: BUN 15; Creatinine, Ser 1.21; Hemoglobin 16.5; Platelets 148; Potassium 4.8; Sodium 141    Risk Assessment/Calculations:    CHA2DS2-VASc Score =     This indicates a  % annual risk of stroke. The patient's score is based upon:          Physical Exam:    VS:  There were no vitals taken for this visit.    Wt Readings from Last 3 Encounters:  09/20/22 171 lb 3.2 oz (77.7 kg)  08/22/22 170 lb (77.1 kg)  08/10/22 175 lb (79.4 kg)     GEN:  Well nourished, well developed in no acute distress CARDIAC: Irregular rhythm, no murmurs, rubs, gallops RESPIRATORY:  Normal work of breathing MUSCULOSKELETAL: no edema    ASSESSMENT & PLAN:    In order of problems listed above:  Atrial fibrillation: with increasing burden, now persistent for the past 4-5 weeks. Failed flecainide, s/p ablation 08/2022 maintaining sinus rhythm Atrial flutter: (see 07/18/22 ECG) resulting in more difficult-to-control rates. S/p flutter ablation. Secondary hypercoagulable state: continue eliquis HTN: atenolol refilled. He will continue a total daily dose of '50mg'$  until the ablation HLD      Follow-up 1 year  Medication Adjustments/Labs and Tests Ordered: Current medicines are reviewed at length with the patient today.  Concerns regarding medicines are outlined above.  No orders of the defined types were placed in this encounter.  No orders of the defined types were placed in this encounter.    Signed, Melida Quitter, MD  11/22/2022 12:16 PM    [place]

## 2022-11-22 NOTE — Patient Instructions (Signed)
Medication Instructions:  Your physician recommends that you continue on your current medications as directed. Please refer to the Current Medication list given to you today.  *If you need a refill on your cardiac medications before your next appointment, please call your pharmacy*  Lab Work: NONE  Testing/Procedures: NONE  Follow-Up: At [facility], you and your health needs are our priority.  As part of our continuing mission to provide you with exceptional heart care, we have created designated Provider Care Teams.  These Care Teams include your primary Cardiologist (physician) and Advanced Practice Providers (APPs -  Physician Assistants and Nurse Practitioners) who all work together to provide you with the care you need, when you need it.  Your next appointment:   1 year(s)  Provider:   You may see Melida Quitter, MD or one of the following Advanced Practice Providers on your designated Care Team:   Tommye Standard, [place] Legrand Como "[facility]" [place], [place]

## 2022-12-10 ENCOUNTER — Telehealth: Payer: Self-pay | Admitting: Cardiology

## 2022-12-10 NOTE — Telephone Encounter (Signed)
Pt called with complaints of elevated BP 160/115 and HR 105 to 120, does not believe to be atrial fib.  He has had ablations in past.  Last 08/2022. He has taken extra atenolol today for total of 75 mg - he has taken xanax.  I have asked him to take another 25 of losartan now.    He has not chest pain, SOB or any complaints.  If he begins to feel bad he should go to ER    Triage could y'all have pt come in for EKG and BP check and review with DOD. Please thank you.

## 2022-12-11 NOTE — H&P (View-Only) (Signed)
Cardiology Office Note Date:  12/12/2022  Patient ID:  Evan Farmer, Evan Farmer 01-21-1951, MRN [id number] PCP:  Cassandria Anger, MD  Electrophysiologist: Dr. Rayann Heman >> Dr. Myles Gip    Chief Complaint:  palpitations, elevated HRs  History of Present Illness: Evan Farmer is a 72 y.o. male with history of HTN, HLD, TIA (2012), AFib/flutter  He underwent EPS/ablation 08/22/22  Saw the AFib clinic as usual post procedure, he was maintaining SR, no post procedural complications noted, no changes were made.  He saw Dr. Myles Gip 11/22/22, maintaining SR at this visit, no changes were made.  The patient called 12/10/22 with high BP's and erratic HRs anywhere from the 50's - 130's associated with some SOB  TODAY He is feeling OK. No CP, palpitations, SOB He reports that he checks his BP/HR daily at home and very stable HRs typically 50's some 60 or so and his BP 120's/70's. Monday he was alarmed with BP 130's 140/90's and his HR 90's -110s He was somewhat aware of his heart beating fast, and confirmed by checking his pulse was fast. Though not feeling poorly.  He took an extra 1/2 tab of both losartan and the atenolol with some improvement in both HR/BP  He is mostly good about taking his medicines, eliquis specifically but admits the night time dose sometimes gets missed.  Not regularly but sometimes, he can not be [id number] certain about the last 2-3 days/nights   AFib/AAD hx Diagnosis goes as afar back as his chart, at least 2008 (probably the 80's by pts recollection) Pt reports an ablation in 1992 at [facility] Flecainide: already an established medicine when referred to Dr. Rayann Heman in 2013 >>> stopped with failure to maintain SR Sept 2023 AFlutter noted 07/18/22 08/22/22: PVI and CTI ablation   Past Medical History:  Diagnosis Date   Anxiety    Cataract    Depression    Diverticulosis    HTN (hypertension)    Hyperlipidemia    Paroxysmal A-fib ([place])    Prostate cancer [facility])    2010  Dr Terance Hart    [facility] spotted fever    age 22   S/P cardiac cath 02/2011   No obstructive disease. Normal LV function   Scoliosis    TIA (transient ischemic attack)    2012   Tubular adenoma of colon    Vitamin B 12 deficiency    Vitamin D deficiency    Weight gain     Past Surgical History:  Procedure Laterality Date   ATRIAL FIBRILLATION ABLATION N/A 08/22/2022   Procedure: ATRIAL FIBRILLATION ABLATION;  Surgeon: Melida Quitter, MD;  Location: [place] CV LAB;  Service: Cardiovascular;  Laterality: N/A;   CARDIOVERSION N/A 07/10/2016   Procedure: CARDIOVERSION;  Surgeon: Lelon Perla, MD;  Location: [facility] ENDOSCOPY;  Service: Cardiovascular;  Laterality: N/A;   CATARACT EXTRACTION W/ INTRAOCULAR LENS  IMPLANT, BILATERAL     COLONOSCOPY  01-24-2005   TICS ONLY    COLONOSCOPY WITH PROPOFOL N/A 12/19/2021   Procedure: COLONOSCOPY WITH PROPOFOL;  Surgeon: Jerene Bears, MD;  Location: [facility] ENDOSCOPY;  Service: Gastroenterology;  Laterality: N/A;   ENDOSCOPIC MUCOSAL RESECTION N/A 12/19/2021   Procedure: ENDOSCOPIC MUCOSAL RESECTION;  Surgeon: Jerene Bears, MD;  Location: [facility] ENDOSCOPY;  Service: Gastroenterology;  Laterality: N/A;   HEMOSTASIS CLIP PLACEMENT  12/19/2021   Procedure: HEMOSTASIS CLIP PLACEMENT;  Surgeon: Jerene Bears, MD;  Location: [facility] ENDOSCOPY;  Service: Gastroenterology;;   HERNIA REPAIR  [employer] and 2003   twice   NASAL SEPTUM SURGERY     POLYPECTOMY  12/19/2021   Procedure: POLYPECTOMY;  Surgeon: Jerene Bears, MD;  Location: [facility] ENDOSCOPY;  Service: Gastroenterology;;   PROSTATECTOMY  11/2008   Robotic    SUBMUCOSAL LIFTING INJECTION  12/19/2021   Procedure: SUBMUCOSAL LIFTING INJECTION;  Surgeon: Jerene Bears, MD;  Location: [facility] ENDOSCOPY;  Service: Gastroenterology;;   SUBMUCOSAL TATTOO INJECTION  12/19/2021   Procedure: SUBMUCOSAL TATTOO INJECTION;  Surgeon: Jerene Bears, MD;  Location: [facility] ENDOSCOPY;  Service: Gastroenterology;;   TEE  WITHOUT CARDIOVERSION N/A 07/10/2016   Procedure: TRANSESOPHAGEAL ECHOCARDIOGRAM (TEE);  Surgeon: Lelon Perla, MD;  Location: [facility] ENDOSCOPY;  Service: Cardiovascular;  Laterality: N/A;    Current Outpatient Medications  Medication Sig Dispense Refill   ALPRAZolam (XANAX) 0.5 MG tablet TAKE 1 TABLET TWICE A DAY  AS NEEDED FOR ANXIETY (Patient taking differently: Take 0.5 mg by mouth as needed for sleep (pt takes 1/2 as needed at bedtime).) 180 tablet 1   atenolol (TENORMIN) 25 MG tablet Take 1 tablet (25 mg total) by mouth 2 (two) times daily. (Patient taking differently: Take 25 mg by mouth daily.) 180 tablet 3   Cholecalciferol (VITAMIN D3) 50 MCG (2000 UT) capsule Take 1 capsule (2,000 Units total) by mouth daily. 100 capsule 3   ciclopirox (PENLAC) 8 % solution Apply topically at bedtime. Apply over nail and surrounding skin daily 6.6 mL 1   Cyanocobalamin (B-12) 1000 MCG SUBL Place 1,000 mcg under the tongue daily.     losartan (COZAAR) 25 MG tablet Take 1 tablet (25 mg total) by mouth daily. (Patient taking differently: Take 25 mg by mouth in the morning and at bedtime. Pt only taking 1/2 tab daily) 90 tablet 2   Multiple Vitamins-Minerals (MULTIVITAMIN WITH MINERALS) tablet Take 1 tablet by mouth daily.     sildenafil (VIAGRA) 100 MG tablet Take 1 tablet (100 mg total) by mouth daily as needed for erectile dysfunction. (Patient taking differently: Take 50 mg by mouth daily as needed for erectile dysfunction.) 12 tablet 5   apixaban (ELIQUIS) 5 MG TABS tablet Take 1 tablet (5 mg total) by mouth 2 (two) times daily. 180 tablet 2   No current facility-administered medications for this visit.    Allergies:   Procainamide hcl and Quinidine   Social History:  The patient  reports that he has never smoked. He has never used smokeless tobacco. He reports current alcohol use of about 8.0 standard drinks of alcohol per week. He reports that he does not use drugs.   Family History:  The  patient's family history includes Arrhythmia in his mother; Atrial fibrillation in his mother; Cancer in his father; Colon cancer in his paternal uncle; Lung cancer in an other family member; Prostate cancer in an other family member.  ROS:  Please see the history of present illness.    All other systems are reviewed and otherwise negative.   PHYSICAL EXAM:  VS:  BP (!) 124/92   Pulse (!) 133   Ht 5' 8.5" (1.74 m)   Wt 176 lb (79.8 kg)   SpO2 99%   BMI 26.37 kg/m  BMI: Body mass index is 26.37 kg/m. Well nourished, well developed, in no acute distress HEENT: normocephalic, atraumatic Neck: no JVD, carotid bruits or masses Cardiac:  RRR; tachycardic no significant murmurs, no rubs, or gallops Lungs:  CTA b/l, no wheezing, rhonchi or rales Abd: soft, nontender MS:  no deformity or atrophy Ext: no edema Skin: warm and dry, no rash Neuro:  No gross deficits appreciated Psych: euthymic mood, full affect   EKG:  Done today and reviewed by myself shows  Atyical AFlutter (2:1) 133bpm  08/22/22: EPS/ablation CONCLUSIONS: 1. Successful PVI 2. Successful ablation/isolation of the CTI 3. Intracardiac echo reveals no significant effusion 4. No early apparent complications.   08/16/22: cardiac CT IMPRESSION: 1. There is normal pulmonary vein drainage into the left atrium with ostial measurements above.   2. There is no thrombus in the left atrial appendage.   3. The esophagus runs in the left atrial midline and is not in proximity to any of the pulmonary vein ostia.   4. No PFO/ASD.   5. Normal coronary origin. Right dominance.   6. CAC score of 1.2 Agatston units which is 17th percentile for age-, race-, and sex-matched controls. This suggests low risk for future cardiac events.  IMPRESSION: No significant extracardiac findings within the visualized chest. Severe thoracolumbar scoliosis   07/24/22; TTE 1. Left ventricular ejection fraction, by estimation, is 55 to 60%.  The  left ventricle has normal function. The left ventricle has no regional  wall motion abnormalities. Diastolic function indeterminant due to Afib.   2. Right ventricular systolic function is normal. The right ventricular  size is normal. There is normal pulmonary artery systolic pressure.   3. Left atrial size was moderately dilated.   4. Right atrial size was mildly dilated.   5. The mitral valve is grossly normal. Trivial mitral valve  regurgitation.   6. The aortic valve is tricuspid. There is mild calcification of the  aortic valve. There is mild thickening of the aortic valve. Aortic valve  regurgitation is mild. Aortic valve sclerosis/calcification is present,  without any evidence of aortic  stenosis.   7. Aortic dilatation noted. There is mild dilatation of the aortic root,  measuring 41 mm. There is mild dilatation of the ascending aorta,  measuring 41 mm.   8. The inferior vena cava is normal in size with greater than 50%  respiratory variability, suggesting right atrial pressure of 3 mmHg.   Comparison(s): [place] to prior TEE report in 2017, there is no  significant change.   Recent Labs: 01/02/2022: TSH 1.47 07/03/2022: ALT 10 08/10/2022: BUN 15; Creatinine, Ser 1.21; Hemoglobin 16.5; Platelets 148; Potassium 4.8; Sodium 141  07/03/2022: Cholesterol 197; HDL 41.40; LDL Cholesterol 130; Total CHOL/HDL Ratio 5; Triglycerides 132.0; VLDL 26.4   CrCl cannot be calculated (Patient's most recent lab result is older than the maximum 21 days allowed.).   Wt Readings from Last 3 Encounters:  12/12/22 176 lb (79.8 kg)  11/22/22 176 lb (79.8 kg)  09/20/22 171 lb 3.2 oz (77.7 kg)     Other studies reviewed: Additional studies/records reviewed today include: summarized above  ASSESSMENT AND PLAN:  Persistent AFib Typical AFlutter, now with an atypical flutter CHA2DS2Vasc is 4, on eliquis, appropriately dosed   We discussed strategies. Not sure this flutter is post  ablation irritability, though likely a new arrhythmia for him I don't think going back to flecainide is reasonable given prior failure. Amiodarone/tikosyn would be options for AAD though he is young for amiodarone  He would not particularly want to pursue a drug requiring a 31/2 day hospitalization.  Reviewed with Dr. Caryl Comes, increased BB not likely to be effective in improving rate He is largely unaware of the flutter, bp stable Discussed TEE/DCCV discussed procedures, he is willing to proceed Earliest  work in we could get was 12/21/22 Advised if develops any symptoms in the meantime (CP, SOB,..) to seek attention  Secondary hypercoagulable state Discussed importance of medication compliance, he has a pill box, will start to use it  HTN Higher then usual, he will use an extra t1/2 tab of his losartan for BP > 140/90   Disposition: F/u with Dr. Myles Gip in a month, sooner if needed  Current medicines are reviewed at length with the patient today.  The patient did not have any concerns regarding medicines.  Venetia Night, PA-C 12/12/2022 12:04 PM     [address] [place] [address] [phone] (office)  [phone] (fax)

## 2022-12-11 NOTE — Progress Notes (Unsigned)
   Cardiology Office Note Date:  12/12/2022  Patient ID:  Evan Farmer, DOB 11/29/1950, MRN [id number] PCP:  Plotnikov, Aleksei V, MD  Electrophysiologist: Dr. Allred >> Dr. Mealor    Chief Complaint:  palpitations, elevated HRs  History of Present Illness: Evan Farmer is a 71 y.o. male with history of HTN, HLD, TIA (2012), AFib/flutter  He underwent EPS/ablation 08/22/22  Saw the AFib clinic as usual post procedure, he was maintaining SR, no post procedural complications noted, no changes were made.  He saw Dr. Mealor 11/22/22, maintaining SR at this visit, no changes were made.  The patient called 12/10/22 with high BP's and erratic HRs anywhere from the 50's - 130's associated with some SOB  TODAY He is feeling OK. No CP, palpitations, SOB He reports that he checks his BP/HR daily at home and very stable HRs typically 50's some 60 or so and his BP 120's/70's. Monday he was alarmed with BP 130's 140/90's and his HR 90's -110s He was somewhat aware of his heart beating fast, and confirmed by checking his pulse was fast. Though not feeling poorly.  He took an extra 1/2 tab of both losartan and the atenolol with some improvement in both HR/BP  He is mostly good about taking his medicines, eliquis specifically but admits the night time dose sometimes gets missed.  Not regularly but sometimes, he can not be 100% certain about the last 2-3 days/nights   AFib/AAD hx Diagnosis goes as afar back as his chart, at least 2008 (probably the 80's by pts recollection) Pt reports an ablation in 1992 at [facility]: already an established medicine when referred to Dr. Allred in 2013 >>> stopped with failure to maintain SR Sept 2023 AFlutter noted 07/18/22 08/22/22: PVI and CTI ablation   Past Medical History:  Diagnosis Date   Anxiety    Cataract    Depression    Diverticulosis    HTN (hypertension)    Hyperlipidemia    Paroxysmal A-fib (HCC)    Prostate cancer (HCC)    2010  Dr Peterson    [place] spotted fever    age 8   S/P cardiac cath 02/2011   No obstructive disease. Normal LV function   Scoliosis    TIA (transient ischemic attack)    2012   Tubular adenoma of colon    Vitamin B 12 deficiency    Vitamin D deficiency    Weight gain     Past Surgical History:  Procedure Laterality Date   ATRIAL FIBRILLATION ABLATION N/A 08/22/2022   Procedure: ATRIAL FIBRILLATION ABLATION;  Surgeon: Mealor, Augustus E, MD;  Location: [facility];  Service: Cardiovascular;  Laterality: N/A;   CARDIOVERSION N/A 07/10/2016   Procedure: CARDIOVERSION;  Surgeon: Brian S Crenshaw, MD;  Location: [facility];  Service: Cardiovascular;  Laterality: N/A;   CATARACT EXTRACTION W/ INTRAOCULAR LENS  IMPLANT, BILATERAL     COLONOSCOPY  01-24-2005   TICS ONLY    COLONOSCOPY WITH PROPOFOL N/A 12/19/2021   Procedure: COLONOSCOPY WITH PROPOFOL;  Surgeon: Pyrtle, Jay M, MD;  Location: [facility];  Service: Gastroenterology;  Laterality: N/A;   ENDOSCOPIC MUCOSAL RESECTION N/A 12/19/2021   Procedure: ENDOSCOPIC MUCOSAL RESECTION;  Surgeon: Pyrtle, Jay M, MD;  Location: [facility];  Service: Gastroenterology;  Laterality: N/A;   HEMOSTASIS CLIP PLACEMENT  12/19/2021   Procedure: HEMOSTASIS CLIP PLACEMENT;  Surgeon: Pyrtle, Jay M, MD;  Location: [facility];  Service: Gastroenterology;;   HERNIA REPAIR       HERNIA REPAIR  1995 and 2003   twice   NASAL SEPTUM SURGERY     POLYPECTOMY  12/19/2021   Procedure: POLYPECTOMY;  Surgeon: Pyrtle, Jay M, MD;  Location: [facility];  Service: Gastroenterology;;   PROSTATECTOMY  11/2008   Robotic    SUBMUCOSAL LIFTING INJECTION  12/19/2021   Procedure: SUBMUCOSAL LIFTING INJECTION;  Surgeon: Pyrtle, Jay M, MD;  Location: [facility];  Service: Gastroenterology;;   SUBMUCOSAL TATTOO INJECTION  12/19/2021   Procedure: SUBMUCOSAL TATTOO INJECTION;  Surgeon: Pyrtle, Jay M, MD;  Location: [facility];  Service: Gastroenterology;;   TEE  WITHOUT CARDIOVERSION N/A 07/10/2016   Procedure: TRANSESOPHAGEAL ECHOCARDIOGRAM (TEE);  Surgeon: Brian S Crenshaw, MD;  Location: [facility];  Service: Cardiovascular;  Laterality: N/A;    Current Outpatient Medications  Medication Sig Dispense Refill   ALPRAZolam (XANAX) 0.5 MG tablet TAKE 1 TABLET TWICE A DAY  AS NEEDED FOR ANXIETY (Patient taking differently: Take 0.5 mg by mouth as needed for sleep (pt takes 1/2 as needed at bedtime).) 180 tablet 1   atenolol (TENORMIN) 25 MG tablet Take 1 tablet (25 mg total) by mouth 2 (two) times daily. (Patient taking differently: Take 25 mg by mouth daily.) 180 tablet 3   Cholecalciferol (VITAMIN D3) 50 MCG (2000 UT) capsule Take 1 capsule (2,000 Units total) by mouth daily. 100 capsule 3   ciclopirox (PENLAC) 8 % solution Apply topically at bedtime. Apply over nail and surrounding skin daily 6.6 mL 1   Cyanocobalamin (B-12) 1000 MCG SUBL Place 1,000 mcg under the tongue daily.     losartan (COZAAR) 25 MG tablet Take 1 tablet (25 mg total) by mouth daily. (Patient taking differently: Take 25 mg by mouth in the morning and at bedtime. Pt only taking 1/2 tab daily) 90 tablet 2   Multiple Vitamins-Minerals (MULTIVITAMIN WITH MINERALS) tablet Take 1 tablet by mouth daily.     sildenafil (VIAGRA) 100 MG tablet Take 1 tablet (100 mg total) by mouth daily as needed for erectile dysfunction. (Patient taking differently: Take 50 mg by mouth daily as needed for erectile dysfunction.) 12 tablet 5   apixaban (ELIQUIS) 5 MG TABS tablet Take 1 tablet (5 mg total) by mouth 2 (two) times daily. 180 tablet 2   No current facility-administered medications for this visit.    Allergies:   Procainamide hcl and Quinidine   Social History:  The patient  reports that he has never smoked. He has never used smokeless tobacco. He reports current alcohol use of about 8.0 standard drinks of alcohol per week. He reports that he does not use drugs.   Family History:  The  patient's family history includes Arrhythmia in his mother; Atrial fibrillation in his mother; Cancer in his father; Colon cancer in his paternal uncle; Lung cancer in an other family member; Prostate cancer in an other family member.  ROS:  Please see the history of present illness.    All other systems are reviewed and otherwise negative.   PHYSICAL EXAM:  VS:  BP (!) 124/92   Pulse (!) 133   Ht 5' 8.5" (1.74 m)   Wt 176 lb (79.8 kg)   SpO2 99%   BMI 26.37 kg/m  BMI: Body mass index is 26.37 kg/m. Well nourished, well developed, in no acute distress HEENT: normocephalic, atraumatic Neck: no JVD, carotid bruits or masses Cardiac:  RRR; tachycardic no significant murmurs, no rubs, or gallops Lungs:  CTA b/l, no wheezing, rhonchi or rales Abd: soft, nontender MS:   no deformity or atrophy Ext: no edema Skin: warm and dry, no rash Neuro:  No gross deficits appreciated Psych: euthymic mood, full affect   EKG:  Done today and reviewed by myself shows  Atyical AFlutter (2:1) 133bpm  08/22/22: EPS/ablation CONCLUSIONS: 1. Successful PVI 2. Successful ablation/isolation of the CTI 3. Intracardiac echo reveals no significant effusion 4. No early apparent complications.   08/16/22: cardiac CT IMPRESSION: 1. There is normal pulmonary vein drainage into the left atrium with ostial measurements above.   2. There is no thrombus in the left atrial appendage.   3. The esophagus runs in the left atrial midline and is not in proximity to any of the pulmonary vein ostia.   4. No PFO/ASD.   5. Normal coronary origin. Right dominance.   6. CAC score of 1.2 Agatston units which is 17th percentile for age-, race-, and sex-matched controls. This suggests low risk for future cardiac events.  IMPRESSION: No significant extracardiac findings within the visualized chest. Severe thoracolumbar scoliosis   07/24/22; TTE 1. Left ventricular ejection fraction, by estimation, is 55 to 60%.  The  left ventricle has normal function. The left ventricle has no regional  wall motion abnormalities. Diastolic function indeterminant due to Afib.   2. Right ventricular systolic function is normal. The right ventricular  size is normal. There is normal pulmonary artery systolic pressure.   3. Left atrial size was moderately dilated.   4. Right atrial size was mildly dilated.   5. The mitral valve is grossly normal. Trivial mitral valve  regurgitation.   6. The aortic valve is tricuspid. There is mild calcification of the  aortic valve. There is mild thickening of the aortic valve. Aortic valve  regurgitation is mild. Aortic valve sclerosis/calcification is present,  without any evidence of aortic  stenosis.   7. Aortic dilatation noted. There is mild dilatation of the aortic root,  measuring 41 mm. There is mild dilatation of the ascending aorta,  measuring 41 mm.   8. The inferior vena cava is normal in size with greater than 50%  respiratory variability, suggesting right atrial pressure of 3 mmHg.   Comparison(s): Compard to prior TEE report in 2017, there is no  significant change.   Recent Labs: 01/02/2022: TSH 1.47 07/03/2022: ALT 10 08/10/2022: BUN 15; Creatinine, Ser 1.21; Hemoglobin 16.5; Platelets 148; Potassium 4.8; Sodium 141  07/03/2022: Cholesterol 197; HDL 41.40; LDL Cholesterol 130; Total CHOL/HDL Ratio 5; Triglycerides 132.0; VLDL 26.4   CrCl cannot be calculated (Patient's most recent lab result is older than the maximum 21 days allowed.).   Wt Readings from Last 3 Encounters:  12/12/22 176 lb (79.8 kg)  11/22/22 176 lb (79.8 kg)  09/20/22 171 lb 3.2 oz (77.7 kg)     Other studies reviewed: Additional studies/records reviewed today include: summarized above  ASSESSMENT AND PLAN:  Persistent AFib Typical AFlutter, now with an atypical flutter CHA2DS2Vasc is 4, on eliquis, appropriately dosed   We discussed strategies. Not sure this flutter is post  ablation irritability, though likely a new arrhythmia for him I don't think going back to flecainide is reasonable given prior failure. Amiodarone/tikosyn would be options for AAD though he is young for amiodarone  He would not particularly want to pursue a drug requiring a 31/2 day hospitalization.  Reviewed with Dr. Klein, increased BB not likely to be effective in improving rate He is largely unaware of the flutter, bp stable Discussed TEE/DCCV discussed procedures, he is willing to proceed Earliest   work in we could get was 12/21/22 Advised if develops any symptoms in the meantime (CP, SOB,..) to seek attention  Secondary hypercoagulable state Discussed importance of medication compliance, he has a pill box, will start to use it  HTN Higher then usual, he will use an extra t1/2 tab of his losartan for BP > 140/90   Disposition: F/u with Dr. Mealor in a month, sooner if needed  Current medicines are reviewed at length with the patient today.  The patient did not have any concerns regarding medicines.  Signed, Waver Dibiasio, PA-C 12/12/2022 12:04 PM     [facility] [address] [place] [address] [phone] (office)  [phone] (fax)   

## 2022-12-11 NOTE — Telephone Encounter (Signed)
Spoke with patient.  Patient reports elevated BP and HR since yesterday.  12/10/22--123/67, HR 55 (morning), 160/115, HR 105 (evening)  12/11/22--125/84, HR 88 (morning)  Patient reports his "heart will get to racing" and ranges 105-130's. He reports feeling "slightly winded" when this occurs.  Patient currently taking Losartan '25mg'$  QD, and atenolol '25mg'$  BID. Patient may take an additional half-tablet of atenolol today for elevated HR if needed. Appt made with Tommye Standard, PA-C for 12/12/22 to obtain EKG, BP check and evaluate symptoms/medications.  Patient verbalized understanding and expressed appreciation for follow-up.

## 2022-12-12 ENCOUNTER — Encounter: Payer: Self-pay | Admitting: *Deleted

## 2022-12-12 ENCOUNTER — Encounter: Payer: Self-pay | Admitting: Physician Assistant

## 2022-12-12 ENCOUNTER — Ambulatory Visit: Payer: 59 | Attending: Physician Assistant | Admitting: Physician Assistant

## 2022-12-12 VITALS — BP 124/92 | HR 133 | Ht 68.5 in | Wt 176.0 lb

## 2022-12-12 DIAGNOSIS — I484 Atypical atrial flutter: Secondary | ICD-10-CM

## 2022-12-12 DIAGNOSIS — I10 Essential (primary) hypertension: Secondary | ICD-10-CM | POA: Diagnosis not present

## 2022-12-12 DIAGNOSIS — I4819 Other persistent atrial fibrillation: Secondary | ICD-10-CM

## 2022-12-12 MED ORDER — APIXABAN 5 MG PO TABS: 5.0000 mg | ORAL_TABLET | Freq: Two times a day (BID) | ORAL | 2 refills | Status: DC

## 2022-12-12 NOTE — Patient Instructions (Addendum)
Medication Instructions:   Your physician recommends that you continue on your current medications as directed. Please refer to the Current Medication list given to you today.   *If you need a refill on your cardiac medications before your next appointment, please call your pharmacy*   Lab Work: BMET AND CBC TODAY    If you have labs (blood work) drawn today and your tests are completely normal, you will receive your results only by: [facility] (if you have MyChart) OR A paper copy in the mail If you have any lab test that is abnormal or we need to change your treatment, we will call you to review the results.   Testing/Procedures: ON 12-21-22 Your physician has requested that you have a TEE/Cardioversion. During a TEE, sound waves are used to create images of your heart. It provides your doctor with information about the size and shape of your heart and how well your heart's chambers and valves are working. In this test, a transducer is attached to the end of a flexible tube that is guided down you throat and into your esophagus (the tube leading from your mouth to your stomach) to get a more detailed image of your heart. Once the TEE has determined that a blood clot is not present, the cardioversion begins. Electrical Cardioversion uses a jolt of electricity to your heart either through paddles or wired patches attached to your chest. This is a controlled, usually prescheduled, procedure. This procedure is done at the hospital and you are not awake during the procedure. You usually go home the day of the procedure. Please see the instruction sheet given to you today for more information.      Follow-Up: At [facility], you and your health needs are our priority.  As part of our continuing mission to provide you with exceptional heart care, we have created designated Provider Care Teams.  These Care Teams include your primary Cardiologist (physician) and Advanced Practice  Providers (APPs -  Physician Assistants and Nurse Practitioners) who all work together to provide you with the care you need, when you need it.  We recommend signing up for the patient portal called "MyChart".  Sign up information is provided on this After Visit Summary.  MyChart is used to connect with patients for Virtual Visits (Telemedicine).  Patients are able to view lab/test results, encounter notes, upcoming appointments, etc.  Non-urgent messages can be sent to your provider as well.   To learn more about what you can do with MyChart, go to [email or web address].    Your next appointment:  POST CARDIOVERSION 1 month(s)  Provider:   Doralee Albino, MD    Other Instructions

## 2022-12-13 LAB — BASIC METABOLIC PANEL
BUN/Creatinine Ratio: 13 (ref 10–24)
BUN: 14 mg/dL (ref 8–27)
CO2: 24 mmol/L (ref 20–29)
Calcium: 9.1 mg/dL (ref 8.6–10.2)
Chloride: 98 mmol/L (ref 96–106)
Creatinine, Ser: 1.1 mg/dL (ref 0.76–1.27)
Glucose: 100 mg/dL — ABNORMAL HIGH (ref 70–99)
Potassium: 4.3 mmol/L (ref 3.5–5.2)
Sodium: 140 mmol/L (ref 134–144)
eGFR: 73 mL/min/{1.73_m2} (ref >59)

## 2022-12-13 LAB — CBC
Hematocrit: 53.5 % — ABNORMAL HIGH (ref 37.5–51.0)
Hemoglobin: 18.5 g/dL — ABNORMAL HIGH (ref 13.0–17.7)
MCH: 33.4 pg — ABNORMAL HIGH (ref 26.6–33.0)
MCHC: 34.6 g/dL (ref 31.5–35.7)
MCV: 97 fL (ref 79–97)
Platelets: 190 10*3/uL (ref 150–450)
RBC: 5.54 x10E6/uL (ref 4.14–5.80)
RDW: 12.3 % (ref 11.6–15.4)
WBC: 5.8 10*3/uL (ref 3.4–10.8)

## 2022-12-17 ENCOUNTER — Telehealth: Payer: Self-pay | Admitting: *Deleted

## 2022-12-17 NOTE — Telephone Encounter (Signed)
-----   Message from [place], [place] sent at 12/14/2022  1:00 PM EST ----- Labs look good for his TEE/DCCV next week.  H/H is high, (high blood count), unclear significance or cause, please forward to his PMD for evaluation and have him f/u with primary please for that. Remind him to not miss any eliquis doses please. How is his HR running?

## 2022-12-20 ENCOUNTER — Encounter (HOSPITAL_COMMUNITY): Payer: Self-pay | Admitting: *Deleted

## 2022-12-21 ENCOUNTER — Ambulatory Visit (HOSPITAL_COMMUNITY): Payer: 59 | Admitting: Certified Registered"

## 2022-12-21 ENCOUNTER — Ambulatory Visit (HOSPITAL_BASED_OUTPATIENT_CLINIC_OR_DEPARTMENT_OTHER): Payer: 59 | Admitting: Certified Registered"

## 2022-12-21 ENCOUNTER — Encounter (HOSPITAL_COMMUNITY): Payer: Self-pay | Admitting: Cardiology

## 2022-12-21 ENCOUNTER — Ambulatory Visit (HOSPITAL_COMMUNITY)
Admission: RE | Admit: 2022-12-21 | Discharge: 2022-12-21 | Disposition: A | Discharge status: Home / Self Care | Payer: 59 | Attending: Cardiology | Admitting: Cardiology

## 2022-12-21 ENCOUNTER — Ambulatory Visit (HOSPITAL_BASED_OUTPATIENT_CLINIC_OR_DEPARTMENT_OTHER)
Admission: RE | Admit: 2022-12-21 | Discharge: 2022-12-21 | Disposition: A | Discharge status: Home / Self Care | Payer: 59 | Source: Ambulatory Visit | Attending: Cardiology | Admitting: Cardiology

## 2022-12-21 ENCOUNTER — Other Ambulatory Visit: Payer: Self-pay

## 2022-12-21 ENCOUNTER — Encounter (HOSPITAL_COMMUNITY)
Admission: RE | Disposition: A | Discharge status: Home / Self Care | Payer: Self-pay | Source: Home / Self Care | Attending: Cardiology

## 2022-12-21 DIAGNOSIS — I080 Rheumatic disorders of both mitral and aortic valves: Secondary | ICD-10-CM | POA: Insufficient documentation

## 2022-12-21 DIAGNOSIS — E785 Hyperlipidemia, unspecified: Secondary | ICD-10-CM | POA: Insufficient documentation

## 2022-12-21 DIAGNOSIS — I480 Paroxysmal atrial fibrillation: Secondary | ICD-10-CM

## 2022-12-21 DIAGNOSIS — Z79899 Other long term (current) drug therapy: Secondary | ICD-10-CM | POA: Diagnosis not present

## 2022-12-21 DIAGNOSIS — I10 Essential (primary) hypertension: Secondary | ICD-10-CM | POA: Insufficient documentation

## 2022-12-21 DIAGNOSIS — Z7901 Long term (current) use of anticoagulants: Secondary | ICD-10-CM | POA: Diagnosis not present

## 2022-12-21 DIAGNOSIS — I2510 Atherosclerotic heart disease of native coronary artery without angina pectoris: Secondary | ICD-10-CM

## 2022-12-21 DIAGNOSIS — I4892 Unspecified atrial flutter: Secondary | ICD-10-CM

## 2022-12-21 DIAGNOSIS — I483 Typical atrial flutter: Secondary | ICD-10-CM

## 2022-12-21 DIAGNOSIS — I351 Nonrheumatic aortic (valve) insufficiency: Secondary | ICD-10-CM | POA: Diagnosis not present

## 2022-12-21 DIAGNOSIS — I4819 Other persistent atrial fibrillation: Secondary | ICD-10-CM | POA: Insufficient documentation

## 2022-12-21 DIAGNOSIS — D6869 Other thrombophilia: Secondary | ICD-10-CM | POA: Diagnosis not present

## 2022-12-21 DIAGNOSIS — I4891 Unspecified atrial fibrillation: Secondary | ICD-10-CM

## 2022-12-21 DIAGNOSIS — I484 Atypical atrial flutter: Secondary | ICD-10-CM | POA: Insufficient documentation

## 2022-12-21 DIAGNOSIS — Z8673 Personal history of transient ischemic attack (TIA), and cerebral infarction without residual deficits: Secondary | ICD-10-CM | POA: Insufficient documentation

## 2022-12-21 HISTORY — PX: TEE WITHOUT CARDIOVERSION: SHX5443

## 2022-12-21 HISTORY — PX: CARDIOVERSION: SHX1299

## 2022-12-21 SURGERY — ECHOCARDIOGRAM, TRANSESOPHAGEAL
Anesthesia: Monitor Anesthesia Care

## 2022-12-21 MED ORDER — PROPOFOL 10 MG/ML IV BOLUS
INTRAVENOUS | Status: DC | PRN
  Administered 2022-12-21 (×3): 20 mg via INTRAVENOUS

## 2022-12-21 MED ORDER — LIDOCAINE 2% (20 MG/ML) 5 ML SYRINGE
INTRAMUSCULAR | Status: DC | PRN
  Administered 2022-12-21: 100 mg via INTRAVENOUS

## 2022-12-21 MED ORDER — PHENYLEPHRINE 80 MCG/ML (10ML) SYRINGE FOR IV PUSH (FOR BLOOD PRESSURE SUPPORT)
PREFILLED_SYRINGE | INTRAVENOUS | Status: DC | PRN
  Administered 2022-12-21: 80 ug via INTRAVENOUS

## 2022-12-21 MED ORDER — PROPOFOL 500 MG/50ML IV EMUL
INTRAVENOUS | Status: DC | PRN
  Administered 2022-12-21: 150 ug/kg/min via INTRAVENOUS

## 2022-12-21 MED ORDER — SODIUM CHLORIDE 0.9 % IV SOLN: INTRAVENOUS | Status: DC

## 2022-12-21 NOTE — Anesthesia Preprocedure Evaluation (Signed)
Anesthesia Evaluation  Patient identified by MRN, date of birth, ID band Patient awake    Reviewed: Allergy & Precautions, NPO status , Patient's Chart, lab work & pertinent test results  Airway Mallampati: II  TM Distance: >3 FB Neck ROM: Full  Mouth opening: Limited Mouth Opening  Dental  (+) Dental Advisory Given, Poor Dentition, Missing, Caps   Pulmonary neg pulmonary ROS   breath sounds clear to auscultation       Cardiovascular hypertension, Pt. on home beta blockers and Pt. on medications + CAD  + dysrhythmias Atrial Fibrillation  Rhythm:Regular Rate:Normal     Neuro/Psych  PSYCHIATRIC DISORDERS Anxiety Depression    TIA   GI/Hepatic negative GI ROS, Neg liver ROS,,,  Endo/Other  negative endocrine ROS    Renal/GU negative Renal ROS     Musculoskeletal   Abdominal   Peds  Hematology negative hematology ROS (+)   Anesthesia Other Findings   Reproductive/Obstetrics                             Anesthesia Physical Anesthesia Plan  ASA: 3  Anesthesia Plan: MAC   Post-op Pain Management: Minimal or no pain anticipated   Induction: Intravenous  PONV Risk Score and Plan: 1 and Treatment may vary due to age or medical condition and Ondansetron  Airway Management Planned: Nasal Cannula  Additional Equipment: None  Intra-op Plan:   Post-operative Plan:   Informed Consent: I have reviewed the patients History and Physical, chart, labs and discussed the procedure including the risks, benefits and alternatives for the proposed anesthesia with the patient or authorized representative who has indicated his/her understanding and acceptance.       Plan Discussed with: Anesthesiologist and CRNA  Anesthesia Plan Comments: (  )        Anesthesia Quick Evaluation

## 2022-12-21 NOTE — Transfer of Care (Signed)
Immediate Anesthesia Transfer of Care Note  Patient: Evan Farmer  Procedure(s) Performed: TRANSESOPHAGEAL ECHOCARDIOGRAM (TEE) CARDIOVERSION  Patient Location: PACU  Anesthesia Type:MAC  Level of Consciousness: awake, alert , and oriented  Airway & Oxygen Therapy: Patient Spontanous Breathing and Patient connected to face mask oxygen  Post-op Assessment: Report given to RN, Post -op Vital signs reviewed and stable, and Patient moving all extremities  Post vital signs: Reviewed and stable  Last Vitals:  Vitals Value Taken Time  BP 119/52 12/21/22 0830  Temp    Pulse 70 12/21/22 0831  Resp 17 12/21/22 0831  SpO2 96 % 12/21/22 0831  Vitals shown include unvalidated device data.  Last Pain:  Vitals:   12/21/22 0714  TempSrc: Temporal  PainSc: 0-No pain         Complications: No notable events documented.

## 2022-12-21 NOTE — Interval H&P Note (Signed)
History and Physical Interval Note:  12/21/2022 7:53 AM  Evan Farmer  has presented today for surgery, with the diagnosis of aflutter.  The various methods of treatment have been discussed with the patient and family. After consideration of risks, benefits and other options for treatment, the patient has consented to  Procedure(s): TRANSESOPHAGEAL ECHOCARDIOGRAM (TEE) (N/A) CARDIOVERSION (N/A) as a surgical intervention.  The patient's history has been reviewed, patient examined, no change in status, stable for surgery.  I have reviewed the patient's chart and labs.  Questions were answered to the patient's satisfaction.     [name]

## 2022-12-21 NOTE — CV Procedure (Signed)
   TRANSESOPHAGEAL ECHOCARDIOGRAM GUIDED DIRECT CURRENT CARDIOVERSION  NAME:  Evan Farmer   MRN: [id number] DOB:  1951-09-04   ADMIT DATE: 12/21/2022  INDICATIONS: Symptomatic atrial flutter  PROCEDURE:   Informed consent was obtained prior to the procedure. The risks, benefits and alternatives for the procedure were discussed and the patient comprehended these risks.  Risks include, but are not limited to, cough, sore throat, vomiting, nausea, somnolence, esophageal and stomach trauma or perforation, bleeding, low blood pressure, aspiration, pneumonia, infection, trauma to the teeth and death.    After a procedural time-out, the patient was sedated by the anesthesia service. The transesophageal probe was inserted in the esophagus and stomach without difficulty and multiple views were obtained. Anesthesia was monitored by Dr. Sabra Heck. Patient received 100 mg IV lidocaine and 242 mg IV propofol.  COMPLICATIONS:    Complications: No complications Patient tolerated procedure well.  FINDINGS:  LEFT VENTRICLE: EF = 55-60%. No regional wall motion abnormalities.  RIGHT VENTRICLE: Normal size and function.   LEFT ATRIUM: No thrombus/mass.  LEFT ATRIAL APPENDAGE: No thrombus/mass.   RIGHT ATRIUM: No thrombus/mass.  AORTIC VALVE:  Trileaflet. Mild regurgitation. No vegetation.  MITRAL VALVE:    Normal structure. Trivial regurgitation. No vegetation.  TRICUSPID VALVE: Normal structure. Trivial regurgitation. No vegetation.  PULMONIC VALVE: Grossly normal structure. Trivial regurgitation. No apparent vegetation.  INTERATRIAL SEPTUM: No PFO or ASD seen by color Doppler.  PERICARDIUM: No effusion noted.  DESCENDING AORTA: Trivial plaque seen   CARDIOVERSION:     Indications:  Symptomatic Atrial Flutter  Procedure Details:  Once the TEE was complete, the patient had the defibrillator pads placed in the anterior and posterior position. Once an appropriate level of sedation was  confirmed, the patient was cardioverted x 1 with 120J of biphasic synchronized energy.  The patient converted to NSR with occasional SVT.  There were no apparent complications.  The patient had normal neuro status and respiratory status post procedure with vitals stable as recorded elsewhere.  Adequate airway was maintained throughout and vital signs monitored per protocol.  Buford Dresser, MD, PhD, [facility] at [facility] [address] [place], [place] [address] [phone]   8:25 AM

## 2022-12-21 NOTE — Anesthesia Postprocedure Evaluation (Signed)
Anesthesia Post Note  Patient: Evan Farmer  Procedure(s) Performed: TRANSESOPHAGEAL ECHOCARDIOGRAM (TEE) CARDIOVERSION     Patient location during evaluation: PACU Anesthesia Type: MAC Level of consciousness: awake and alert Pain management: pain level controlled Vital Signs Assessment: post-procedure vital signs reviewed and stable Respiratory status: spontaneous breathing, nonlabored ventilation and respiratory function stable Cardiovascular status: blood pressure returned to baseline and stable Postop Assessment: no apparent nausea or vomiting Anesthetic complications: no   No notable events documented.  Last Vitals:  Vitals:   12/21/22 0900 12/21/22 0903  BP: 96/68 108/81  Pulse: 63 65  Resp: 12 11  Temp:  (!) 36.2 C  SpO2: 96% 94%    Last Pain:  Vitals:   12/21/22 0903  TempSrc:   PainSc: 0-No pain                 Lynda Rainwater

## 2022-12-21 NOTE — Progress Notes (Signed)
  Echocardiogram Echocardiogram Transesophageal has been performed.  Freida Busman 12/21/2022, 8:41 AM

## 2022-12-21 NOTE — Anesthesia Procedure Notes (Signed)
Procedure Name: MAC Date/Time: 12/21/2022 8:02 AM  Performed by: Amadeo Garnet, CRNAPre-anesthesia Checklist: Patient identified, Emergency Drugs available, Patient being monitored and Suction available Patient Re-evaluated:Patient Re-evaluated prior to induction Oxygen Delivery Method: Nasal cannula Preoxygenation: Pre-oxygenation with 100% oxygen Placement Confirmation: positive ETCO2 Dental Injury: Teeth and Oropharynx as per pre-operative assessment

## 2022-12-24 ENCOUNTER — Encounter (HOSPITAL_COMMUNITY): Payer: Self-pay | Admitting: Cardiology

## 2022-12-24 NOTE — Progress Notes (Signed)
Attempted, unable to leave message/brt, rn

## 2023-01-01 ENCOUNTER — Ambulatory Visit: Payer: 59 | Admitting: Internal Medicine

## 2023-01-01 ENCOUNTER — Encounter: Payer: Self-pay | Admitting: Internal Medicine

## 2023-01-01 VITALS — BP 118/72 | HR 53 | Temp 97.7°F | Ht 68.0 in | Wt 173.0 lb

## 2023-01-01 DIAGNOSIS — E538 Deficiency of other specified B group vitamins: Secondary | ICD-10-CM | POA: Diagnosis not present

## 2023-01-01 DIAGNOSIS — F411 Generalized anxiety disorder: Secondary | ICD-10-CM

## 2023-01-01 DIAGNOSIS — I10 Essential (primary) hypertension: Secondary | ICD-10-CM

## 2023-01-01 DIAGNOSIS — I480 Paroxysmal atrial fibrillation: Secondary | ICD-10-CM | POA: Diagnosis not present

## 2023-01-01 DIAGNOSIS — K635 Polyp of colon: Secondary | ICD-10-CM

## 2023-01-01 DIAGNOSIS — E559 Vitamin D deficiency, unspecified: Secondary | ICD-10-CM

## 2023-01-01 DIAGNOSIS — Z0000 Encounter for general adult medical examination without abnormal findings: Secondary | ICD-10-CM

## 2023-01-01 NOTE — Progress Notes (Signed)
Subjective:  Patient ID: Evan Farmer, male    DOB: 06/16/1951  Age: 72 y.o. MRN: [id number]  CC: Follow-up   HPI Evan Farmer presents for A fib - s/p ablation F/u anxiety, HTN, B12 def  Outpatient Medications Prior to Visit  Medication Sig Dispense Refill   ALPRAZolam (XANAX) 0.5 MG tablet TAKE 1 TABLET TWICE A DAY  AS NEEDED FOR ANXIETY (Patient taking differently: Take 0.5 mg by mouth as needed for sleep (pt takes 1/2 as needed at bedtime).) 180 tablet 1   apixaban (ELIQUIS) 5 MG TABS tablet Take 1 tablet (5 mg total) by mouth 2 (two) times daily. 180 tablet 2   atenolol (TENORMIN) 25 MG tablet Take 1 tablet (25 mg total) by mouth 2 (two) times daily. (Patient taking differently: Take 25 mg by mouth daily.) 180 tablet 3   Cholecalciferol (VITAMIN D3) 50 MCG (2000 UT) capsule Take 1 capsule (2,000 Units total) by mouth daily. 100 capsule 3   ciclopirox (PENLAC) 8 % solution Apply topically at bedtime. Apply over nail and surrounding skin daily 6.6 mL 1   Cyanocobalamin (B-12) 1000 MCG SUBL Place 1,000 mcg under the tongue daily.     losartan (COZAAR) 25 MG tablet Take 1 tablet (25 mg total) by mouth daily. (Patient taking differently: Take 25 mg by mouth in the morning and at bedtime. Pt only taking 1/2 tab daily) 90 tablet 2   Multiple Vitamins-Minerals (MULTIVITAMIN WITH MINERALS) tablet Take 1 tablet by mouth daily.     sildenafil (VIAGRA) 100 MG tablet Take 1 tablet (100 mg total) by mouth daily as needed for erectile dysfunction. (Patient taking differently: Take 50 mg by mouth daily as needed for erectile dysfunction.) 12 tablet 5   No facility-administered medications prior to visit.    ROS: Review of Systems  Constitutional:  Negative for appetite change, fatigue and unexpected weight change.  HENT:  Negative for congestion, nosebleeds, sneezing, sore throat and trouble swallowing.   Eyes:  Negative for itching and visual disturbance.  Respiratory:  Negative for cough.    Cardiovascular:  Positive for palpitations. Negative for chest pain and leg swelling.  Gastrointestinal:  Negative for abdominal distention, blood in stool, diarrhea and nausea.  Genitourinary:  Negative for frequency and hematuria.  Musculoskeletal:  Negative for back pain, gait problem, joint swelling and neck pain.  Skin:  Negative for rash.  Neurological:  Negative for dizziness, tremors, speech difficulty and weakness.  Psychiatric/Behavioral:  Negative for agitation, dysphoric mood, sleep disturbance and suicidal ideas. The patient is nervous/anxious.     Objective:  BP 118/72 (BP Location: Left Arm, Patient Position: Sitting, Cuff Size: Normal)   Pulse (!) 53   Temp 97.7 F (36.5 C) (Oral)   Ht 5' 8"$  (1.727 m)   Wt 173 lb (78.5 kg)   SpO2 98%   BMI 26.30 kg/m   BP Readings from Last 3 Encounters:  01/01/23 118/72  12/21/22 108/81  12/12/22 (!) 124/92    Wt Readings from Last 3 Encounters:  01/01/23 173 lb (78.5 kg)  12/21/22 175 lb (79.4 kg)  12/12/22 176 lb (79.8 kg)    Physical Exam Constitutional:      General: He is not in acute distress.    Appearance: He is well-developed.     Comments: NAD  Eyes:     Conjunctiva/sclera: Conjunctivae normal.     Pupils: Pupils are equal, round, and reactive to light.  Neck:     Thyroid: No thyromegaly.  Vascular: No JVD.  Cardiovascular:     Rate and Rhythm: Normal rate and regular rhythm.     Heart sounds: Normal heart sounds. No murmur heard.    No friction rub. No gallop.  Pulmonary:     Effort: Pulmonary effort is normal. No respiratory distress.     Breath sounds: Normal breath sounds. No wheezing or rales.  Chest:     Chest wall: No tenderness.  Abdominal:     General: Bowel sounds are normal. There is no distension.     Palpations: Abdomen is soft. There is no mass.     Tenderness: There is no abdominal tenderness. There is no guarding or rebound.  Musculoskeletal:        General: No tenderness. Normal  range of motion.     Cervical back: Normal range of motion.  Lymphadenopathy:     Cervical: No cervical adenopathy.  Skin:    General: Skin is warm and dry.     Findings: No rash.  Neurological:     Mental Status: He is alert and oriented to person, place, and time.     Cranial Nerves: No cranial nerve deficit.     Motor: No abnormal muscle tone.     Coordination: Coordination normal.     Gait: Gait normal.     Deep Tendon Reflexes: Reflexes are normal and symmetric.  Psychiatric:        Behavior: Behavior normal.        Thought Content: Thought content normal.        Judgment: Judgment normal.     Lab Results  Component Value Date   WBC 5.8 12/12/2022   HGB 18.5 (H) 12/12/2022   HCT 53.5 (H) 12/12/2022   PLT 190 12/12/2022   GLUCOSE 100 (H) 12/12/2022   CHOL 197 07/03/2022   TRIG 132.0 07/03/2022   HDL 41.40 07/03/2022   LDLCALC 130 (H) 07/03/2022   ALT 10 07/03/2022   AST 14 07/03/2022   NA 140 12/12/2022   K 4.3 12/12/2022   CL 98 12/12/2022   CREATININE 1.10 12/12/2022   BUN 14 12/12/2022   CO2 24 12/12/2022   TSH 1.47 01/02/2022   PSA <0.1 06/20/2020   INR 0.95 02/12/2011   HGBA1C  01/20/2011    5.2 (NOTE)                                                                       According to the ADA Clinical Practice Recommendations for 2011, when HbA1c is used as a screening test:   >=6.5%   Diagnostic of Diabetes Mellitus           (if abnormal result  is confirmed)  5.7-6.4%   Increased risk of developing Diabetes Mellitus  References:Diagnosis and Classification of Diabetes Mellitus,Diabetes [id number] 1):S62-S69 and Standards of Medical Care in         Diabetes - 2011,Diabetes Care,2011,34  (Suppl 1):S11-S61.    No results found.  Assessment & Plan:   Problem List Items Addressed This Visit       Cardiovascular and Mediastinum   Paroxysmal A-fib ([place]) - Primary    Atenolol, Flecanide, Eliquis      Relevant Orders   TSH   Urinalysis  CBC  with Differential/Platelet   Lipid panel   Comprehensive metabolic panel   HTN (hypertension)    Cont w/Atenolol, Losartan        Digestive   Colon polyps    Colon due in 2024        Other   Well adult exam   Relevant Orders   TSH   Urinalysis   CBC with Differential/Platelet   Lipid panel   Comprehensive metabolic panel   Vitamin D deficiency    Chronic  Vit D      B12 deficiency    On B12      Relevant Orders   Vitamin B12   Anxiety state    Zoloft, prn Xanax  Potential benefits of a long term benzodiazepines  use as well as potential risks  and complications were explained to the patient and were aknowledged.         No orders of the defined types were placed in this encounter.     Follow-up: Return in about 6 months (around 07/02/2023) for Wellness Exam.  Walker Kehr, MD

## 2023-01-01 NOTE — Assessment & Plan Note (Signed)
Zoloft, prn Xanax  Potential benefits of a long term benzodiazepines  use as well as potential risks  and complications were explained to the patient and were aknowledged.

## 2023-01-01 NOTE — Assessment & Plan Note (Signed)
Atenolol, Flecanide, Eliquis

## 2023-01-01 NOTE — Assessment & Plan Note (Signed)
Colon due in 2024

## 2023-01-01 NOTE — Assessment & Plan Note (Signed)
Cont w/Atenolol, Losartan

## 2023-01-01 NOTE — Assessment & Plan Note (Signed)
On B12 

## 2023-01-01 NOTE — Assessment & Plan Note (Signed)
Chronic  Vit D

## 2023-01-02 ENCOUNTER — Telehealth: Payer: Self-pay

## 2023-01-02 NOTE — Telephone Encounter (Signed)
Spoke with patient following a my chart message. Patient states he went into afib this morning and his HR is 125. His baseline HR is 50-60. BP is 132/86.  He denies SOB, chest pain or other symptoms.  He is taking Eliquis and he takes atenolol 25 mg BID.   He is scheduled with Dr Myles Gip on 9/29. Advised patient that I would make Dr Myles Gip aware. Also advised he not miss any doses of Eliquis, if he develops additional symptoms like chest pain, SOB to got to the ED for evaluation. Patient verbalized understanding and had no questions.

## 2023-01-04 MED ORDER — FLECAINIDE ACETATE 50 MG PO TABS: 50.0000 mg | ORAL_TABLET | Freq: Two times a day (BID) | ORAL | 1 refills | Status: DC

## 2023-01-04 NOTE — Telephone Encounter (Signed)
Left message for patient to call back  

## 2023-01-04 NOTE — Telephone Encounter (Signed)
Call transferred to triage.  Spoke with patient and discussed Dr. Karel Jarvis recommendation to restart flecainide '50mg'$  twice daily.  Prescription sent to pharmacy of choice. Patient has appt with Dr. Myles Gip on 01/10/23. Patient verbalized understanding and expressed appreciation for call.

## 2023-01-04 NOTE — Addendum Note (Signed)
Addended by: Molli Barrows on: 01/04/2023 04:51 PM   Modules accepted: Orders

## 2023-01-10 ENCOUNTER — Encounter: Payer: Self-pay | Admitting: Cardiovascular Disease

## 2023-01-10 ENCOUNTER — Ambulatory Visit: Payer: 59 | Attending: Cardiovascular Disease | Admitting: Cardiovascular Disease

## 2023-01-10 VITALS — BP 114/86 | HR 114 | Ht 68.0 in | Wt 173.4 lb

## 2023-01-10 DIAGNOSIS — I10 Essential (primary) hypertension: Secondary | ICD-10-CM | POA: Diagnosis not present

## 2023-01-10 MED ORDER — LOSARTAN POTASSIUM 25 MG PO TABS: 25.0000 mg | ORAL_TABLET | Freq: Every day | ORAL | 2 refills | Status: DC

## 2023-01-10 MED ORDER — ATENOLOL 25 MG PO TABS: 25.0000 mg | ORAL_TABLET | Freq: Two times a day (BID) | ORAL | 3 refills | Status: DC

## 2023-01-10 NOTE — H&P (View-Only) (Signed)
Cardiology Office Note:    Date:  01/10/2023   ID:  Evan Farmer, DOB 10/04/1951, MRN [id number]  PCP:  Plotnikov, Aleksei V, MD    [facility] Providers Cardiologist:  None Electrophysiologist:  Dantrell Schertzer E Beverlyn Mcginness, MD     Referring MD: Plotnikov, Aleksei V, MD   Chief complaint:   History of Present Illness:    Evan Farmer is a 72 y.o. male with a hx of HTN, HLD, TIA atrial fibrillation and flutter referred for rhythm management. He is a former patient of Dr. Allred.  He has a long history of arrhythmia as documented below.  Briefly, he was first diagnosed with some sort of rapid arrhythmia and 1981 or 82.  His rhythm was well controlled for years with flecainide.  He believes he underwent an ablation at [facility] in 1992.  After that, he believes he remained arrhythmia free for years on flecainide.  Within the past year he began to have occasional episodes, and his atenolol dose was adjusted for rate control.  He believes he went into A-fib about 4 to 5 weeks ago and has remained in it since.  His symptoms are fairly mild, but he does have increased fatigue and shortness of breath when in atrial fibrillation.  He is seen as an urgent visit today due to recurrence of arrhythmia.  He noticed an elevated heart rate.  ECG showed an atypical atrial flutter with a different morphology than he has had in the past.  Flecainide was restarted.  His heart rates at home have been running in the 70s and 80s.  His symptoms are fairly mild.  Arrhythmia History: First episodes of arrhythmia: occurred in the early 80's. He reports his HR was about 180 bpm. He was treated with antiarrhythmic medication but is not certain of the name. 1987: had recurrence of arrhythmia. He believes it was AF. Flecainide was started, per his recollection, in the mid-80's 1992: per patient, he had an ablation at [facility] 06/2022: AF recurred and became persistent.  07/18/2022: Seen in AF clinic. ECG showed  atrial flutter 08/22/22: Ablation of AF and typical flutter 12/2022: Onset of atypical atrial flutter   Past Medical History:  Diagnosis Date   Anxiety    Cataract    Depression    Diverticulosis    HTN (hypertension)    Hyperlipidemia    Paroxysmal A-fib (HCC)    Prostate cancer (HCC)    2010 Dr Peterson    [place] spotted fever    age 8   S/P cardiac cath 02/2011   No obstructive disease. Normal LV function   Scoliosis    TIA (transient ischemic attack)    2012   Tubular adenoma of colon    Vitamin B 12 deficiency    Vitamin D deficiency    Weight gain     Past Surgical History:  Procedure Laterality Date   ATRIAL FIBRILLATION ABLATION N/A 08/22/2022   Procedure: ATRIAL FIBRILLATION ABLATION;  Surgeon: Viggo Perko E, MD;  Location: [facility];  Service: Cardiovascular;  Laterality: N/A;   CARDIOVERSION N/A 07/10/2016   Procedure: CARDIOVERSION;  Surgeon: Brian S Crenshaw, MD;  Location: [facility];  Service: Cardiovascular;  Laterality: N/A;   CARDIOVERSION N/A 12/21/2022   Procedure: CARDIOVERSION;  Surgeon: Christopher, Bridgette, MD;  Location: [facility];  Service: Cardiovascular;  Laterality: N/A;   CATARACT EXTRACTION W/ INTRAOCULAR LENS  IMPLANT, BILATERAL     COLONOSCOPY  01-24-2005   TICS ONLY      COLONOSCOPY WITH PROPOFOL N/A 12/19/2021   Procedure: COLONOSCOPY WITH PROPOFOL;  Surgeon: Pyrtle, Jay M, MD;  Location: [facility];  Service: Gastroenterology;  Laterality: N/A;   ENDOSCOPIC MUCOSAL RESECTION N/A 12/19/2021   Procedure: ENDOSCOPIC MUCOSAL RESECTION;  Surgeon: Pyrtle, Jay M, MD;  Location: [facility];  Service: Gastroenterology;  Laterality: N/A;   HEMOSTASIS CLIP PLACEMENT  12/19/2021   Procedure: HEMOSTASIS CLIP PLACEMENT;  Surgeon: Pyrtle, Jay M, MD;  Location: [facility];  Service: Gastroenterology;;   HERNIA REPAIR     HERNIA REPAIR  1995 and 2003   twice   NASAL SEPTUM SURGERY     POLYPECTOMY  12/19/2021   Procedure:  POLYPECTOMY;  Surgeon: Pyrtle, Jay M, MD;  Location: [facility];  Service: Gastroenterology;;   PROSTATECTOMY  11/2008   Robotic    SUBMUCOSAL LIFTING INJECTION  12/19/2021   Procedure: SUBMUCOSAL LIFTING INJECTION;  Surgeon: Pyrtle, Jay M, MD;  Location: [facility];  Service: Gastroenterology;;   SUBMUCOSAL TATTOO INJECTION  12/19/2021   Procedure: SUBMUCOSAL TATTOO INJECTION;  Surgeon: Pyrtle, Jay M, MD;  Location: [facility];  Service: Gastroenterology;;   TEE WITHOUT CARDIOVERSION N/A 07/10/2016   Procedure: TRANSESOPHAGEAL ECHOCARDIOGRAM (TEE);  Surgeon: Brian S Crenshaw, MD;  Location: [facility];  Service: Cardiovascular;  Laterality: N/A;   TEE WITHOUT CARDIOVERSION N/A 12/21/2022   Procedure: TRANSESOPHAGEAL ECHOCARDIOGRAM (TEE);  Surgeon: Christopher, Bridgette, MD;  Location: [facility];  Service: Cardiovascular;  Laterality: N/A;    Current Medications: Current Meds  Medication Sig   ALPRAZolam (XANAX) 0.5 MG tablet TAKE 1 TABLET TWICE A DAY  AS NEEDED FOR ANXIETY (Patient taking differently: Take 0.5 mg by mouth as needed for sleep (pt takes 1/2 as needed at bedtime).)   apixaban (ELIQUIS) 5 MG TABS tablet Take 1 tablet (5 mg total) by mouth 2 (two) times daily.   atenolol (TENORMIN) 25 MG tablet Take 1 tablet (25 mg total) by mouth 2 (two) times daily. (Patient taking differently: Take 25 mg by mouth daily.)   Cholecalciferol (VITAMIN D3) 50 MCG (2000 UT) capsule Take 1 capsule (2,000 Units total) by mouth daily.   ciclopirox (PENLAC) 8 % solution Apply topically at bedtime. Apply over nail and surrounding skin daily   Cyanocobalamin (B-12) 1000 MCG SUBL Place 1,000 mcg under the tongue daily.   flecainide (TAMBOCOR) 50 MG tablet Take 1 tablet (50 mg total) by mouth 2 (two) times daily.   losartan (COZAAR) 25 MG tablet Take 1 tablet (25 mg total) by mouth daily. (Patient taking differently: Take 25 mg by mouth in the morning and at bedtime. Pt only taking 1/2 tab daily)    Multiple Vitamins-Minerals (MULTIVITAMIN WITH MINERALS) tablet Take 1 tablet by mouth daily.   sildenafil (VIAGRA) 100 MG tablet Take 1 tablet (100 mg total) by mouth daily as needed for erectile dysfunction. (Patient taking differently: Take 50 mg by mouth daily as needed for erectile dysfunction.)     Allergies:   Procainamide hcl and Quinidine   Social History   Socioeconomic History   Marital status: Divorced    Spouse name: Not on file   Number of children: Not on file   Years of education: Not on file   Highest education level: Not on file  Occupational History   Not on file  Tobacco Use   Smoking status: Never   Smokeless tobacco: Never  Vaping Use   Vaping Use: Never used  Substance and Sexual Activity   Alcohol use: Yes    Alcohol/week: 8.0 standard   drinks of alcohol    Types: 4 Glasses of wine, 4 Shots of liquor per week    Comment: Social alcohol use   Drug use: No   Sexual activity: Not on file  Other Topics Concern   Not on file  Social History Narrative   Regular Exercise -  YES   Social Determinants of Health   Financial Resource Strain: Low Risk  (07/13/2022)   Overall Financial Resource Strain (CARDIA)    Difficulty of Paying Living Expenses: Not hard at all  Food Insecurity: No Food Insecurity (07/13/2022)   Hunger Vital Sign    Worried About Running Out of Food in the Last Year: Never true    Ran Out of Food in the Last Year: Never true  Transportation Needs: No Transportation Needs (07/13/2022)   PRAPARE - Transportation    Lack of Transportation (Medical): No    Lack of Transportation (Non-Medical): No  Physical Activity: Sufficiently Active (07/13/2022)   Exercise Vital Sign    Days of Exercise per Week: 5 days    Minutes of Exercise per Session: 30 min  Stress: No Stress Concern Present (07/13/2022)   [employer] - Occupational Stress Questionnaire    Feeling of Stress : Not at all  Social Connections: Socially Integrated  (07/13/2022)   Social Connection and Isolation Panel [NHANES]    Frequency of Communication with Friends and Family: More than three times a week    Frequency of Social Gatherings with Friends and Family: More than three times a week    Attends Religious Services: More than 4 times per year    Active Member of Clubs or Organizations: Yes    Attends Club or Organization Meetings: More than 4 times per year    Marital Status: Living with partner     Family History: The patient's family history includes Arrhythmia in his mother; Atrial fibrillation in his mother; Cancer in his father; Colon cancer in his paternal uncle; Lung cancer in an other family member; Prostate cancer in an other family member. There is no history of Rectal cancer or Stomach cancer.  ROS:   Please see the history of present illness.    All other systems reviewed and are negative.  EKGs/Labs/Other Studies Reviewed:    The following studies were reviewed today: TTE 10/2022:  1. Left ventricular ejection fraction, by estimation, is 55 to 60%. The  left ventricle has normal function. The left ventricle has no regional  wall motion abnormalities. Diastolic function indeterminant due to Afib.   2. Right ventricular systolic function is normal. The right ventricular  size is normal. There is normal pulmonary artery systolic pressure.   3. Left atrial size was moderately dilated.   4. Right atrial size was mildly dilated.   5. The mitral valve is grossly normal. Trivial mitral valve  regurgitation.   6. The aortic valve is tricuspid. There is mild calcification of the  aortic valve. There is mild thickening of the aortic valve. Aortic valve  regurgitation is mild. Aortic valve sclerosis/calcification is present,  without any evidence of aortic  stenosis.   7. Aortic dilatation noted. There is mild dilatation of the aortic root,  measuring 41 mm. There is mild dilatation of the ascending aorta,  measuring 41 mm.   8.  The inferior vena cava is normal in size with greater than 50%  respiratory variability, suggesting right atrial pressure of 3 mmHg.   EKG:  EKG is  ordered today.      Orders placed or performed during the hospital encounter of 12/21/22   EKG 12-Lead pre-cardioversion   EKG 12-Lead   EKG 12-Lead pre-cardioversion   EKG 12-Lead     Recent Labs: 07/03/2022: ALT 10 12/12/2022: BUN 14; Creatinine, Ser 1.10; Hemoglobin 18.5; Platelets 190; Potassium 4.3; Sodium 140    Risk Assessment/Calculations:    CHA2DS2-VASc Score =     This indicates a  % annual risk of stroke. The patient's score is based upon:          Physical Exam:    VS:  BP 114/86   Pulse (!) 114   Ht 5' 8" (1.727 m)   Wt 173 lb 6.4 oz (78.7 kg)   SpO2 97%   BMI 26.37 kg/m     Wt Readings from Last 3 Encounters:  01/10/23 173 lb 6.4 oz (78.7 kg)  01/01/23 173 lb (78.5 kg)  12/21/22 175 lb (79.4 kg)     GEN:  Well nourished, well developed in no acute distress CARDIAC: Irregular rhythm, no murmurs, rubs, gallops RESPIRATORY:  Normal work of breathing MUSCULOSKELETAL: no edema    ASSESSMENT & PLAN:    In order of problems listed above:  Atrial fibrillation: s/p ablation 08/2022  Atypical atrial flutter: occurred 12/2022 --this is a new atypical flutter that is occurred since his ablation for atrial fibrillation and typical flutter in October 2023.  I think at this point, given multiple flutter circuits and A-fib, we will resume flecainide which he has tolerated well in the past.  We will schedule cardioversion.  If he has recurrence, we will switch to Tikosyn. Secondary hypercoagulable state: continue eliquis HTN: atenolol refilled. He will continue a total daily dose of 50mg until the ablation HLD      Follow-up 1 year  Medication Adjustments/Labs and Tests Ordered: Current medicines are reviewed at length with the patient today.  Concerns regarding medicines are outlined above.  No orders of the  defined types were placed in this encounter.  No orders of the defined types were placed in this encounter.    Signed, Shadai Mcclane E Jacinta Penalver, MD  01/10/2023 10:11 AM    [facility] 

## 2023-01-10 NOTE — Progress Notes (Signed)
Cardiology Office Note:    Date:  01/10/2023   ID:  Evan Farmer, DOB 1951/09/16, MRN [id number]  PCP:  Cassandria Anger, MD   [place] Providers Cardiologist:  None Electrophysiologist:  Melida Quitter, MD     Referring MD: Cassandria Anger, MD   Chief complaint:   History of Present Illness:    Evan Farmer is a 72 y.o. male with a hx of HTN, HLD, TIA atrial fibrillation and flutter referred for rhythm management. He is a former patient of Dr. Rayann Heman.  He has a long history of arrhythmia as documented below.  Briefly, he was first diagnosed with some sort of rapid arrhythmia and 1981 or 82.  His rhythm was well controlled for years with flecainide.  He believes he underwent an ablation at [facility] in 1992.  After that, he believes he remained arrhythmia free for years on flecainide.  Within the past year he began to have occasional episodes, and his atenolol dose was adjusted for rate control.  He believes he went into A-fib about 4 to 5 weeks ago and has remained in it since.  His symptoms are fairly mild, but he does have increased fatigue and shortness of breath when in atrial fibrillation.  He is seen as an urgent visit today due to recurrence of arrhythmia.  He noticed an elevated heart rate.  ECG showed an atypical atrial flutter with a different morphology than he has had in the past.  Flecainide was restarted.  His heart rates at home have been running in the 70s and 80s.  His symptoms are fairly mild.  Arrhythmia History: First episodes of arrhythmia: occurred in the early 80's. He reports his HR was about 180 bpm. He was treated with antiarrhythmic medication but is not certain of the name. 1987: had recurrence of arrhythmia. He believes it was AF. Flecainide was started, per his recollection, in the mid-80's 1992: per patient, he had an ablation at [facility] 06/2022: AF recurred and became persistent.  07/18/2022: Seen in AF clinic. ECG showed  atrial flutter 08/22/22: Ablation of AF and typical flutter 12/2022: Onset of atypical atrial flutter   Past Medical History:  Diagnosis Date   Anxiety    Cataract    Depression    Diverticulosis    HTN (hypertension)    Hyperlipidemia    Paroxysmal A-fib (HCC)    Prostate cancer [facility])    2010 Dr Terance Hart    [facility] spotted fever    age 6   S/P cardiac cath 02/2011   No obstructive disease. Normal LV function   Scoliosis    TIA (transient ischemic attack)    2012   Tubular adenoma of colon    Vitamin B 12 deficiency    Vitamin D deficiency    Weight gain     Past Surgical History:  Procedure Laterality Date   ATRIAL FIBRILLATION ABLATION N/A 08/22/2022   Procedure: ATRIAL FIBRILLATION ABLATION;  Surgeon: Melida Quitter, MD;  Location: [facility] LAB;  Service: Cardiovascular;  Laterality: N/A;   CARDIOVERSION N/A 07/10/2016   Procedure: CARDIOVERSION;  Surgeon: Lelon Perla, MD;  Location: [facility] ENDOSCOPY;  Service: Cardiovascular;  Laterality: N/A;   CARDIOVERSION N/A 12/21/2022   Procedure: CARDIOVERSION;  Surgeon: Buford Dresser, MD;  Location: [facility] ENDOSCOPY;  Service: Cardiovascular;  Laterality: N/A;   CATARACT EXTRACTION W/ INTRAOCULAR LENS  IMPLANT, BILATERAL     COLONOSCOPY  01-24-2005   TICS ONLY  COLONOSCOPY WITH PROPOFOL N/A 12/19/2021   Procedure: COLONOSCOPY WITH PROPOFOL;  Surgeon: Jerene Bears, MD;  Location: [facility] ENDOSCOPY;  Service: Gastroenterology;  Laterality: N/A;   ENDOSCOPIC MUCOSAL RESECTION N/A 12/19/2021   Procedure: ENDOSCOPIC MUCOSAL RESECTION;  Surgeon: Jerene Bears, MD;  Location: [facility] ENDOSCOPY;  Service: Gastroenterology;  Laterality: N/A;   HEMOSTASIS CLIP PLACEMENT  12/19/2021   Procedure: HEMOSTASIS CLIP PLACEMENT;  Surgeon: Jerene Bears, MD;  Location: [facility] ENDOSCOPY;  Service: Gastroenterology;;   [employer] and 2003   twice   NASAL SEPTUM SURGERY     POLYPECTOMY  12/19/2021   Procedure:  POLYPECTOMY;  Surgeon: Jerene Bears, MD;  Location: [facility] ENDOSCOPY;  Service: Gastroenterology;;   PROSTATECTOMY  11/2008   Robotic    SUBMUCOSAL LIFTING INJECTION  12/19/2021   Procedure: SUBMUCOSAL LIFTING INJECTION;  Surgeon: Jerene Bears, MD;  Location: [facility] ENDOSCOPY;  Service: Gastroenterology;;   SUBMUCOSAL TATTOO INJECTION  12/19/2021   Procedure: SUBMUCOSAL TATTOO INJECTION;  Surgeon: Jerene Bears, MD;  Location: [facility] ENDOSCOPY;  Service: Gastroenterology;;   TEE WITHOUT CARDIOVERSION N/A 07/10/2016   Procedure: TRANSESOPHAGEAL ECHOCARDIOGRAM (TEE);  Surgeon: Lelon Perla, MD;  Location: [facility] ENDOSCOPY;  Service: Cardiovascular;  Laterality: N/A;   TEE WITHOUT CARDIOVERSION N/A 12/21/2022   Procedure: TRANSESOPHAGEAL ECHOCARDIOGRAM (TEE);  Surgeon: Buford Dresser, MD;  Location: [facility] ENDOSCOPY;  Service: Cardiovascular;  Laterality: N/A;    Current Medications: Current Meds  Medication Sig   ALPRAZolam (XANAX) 0.5 MG tablet TAKE 1 TABLET TWICE A DAY  AS NEEDED FOR ANXIETY (Patient taking differently: Take 0.5 mg by mouth as needed for sleep (pt takes 1/2 as needed at bedtime).)   apixaban (ELIQUIS) 5 MG TABS tablet Take 1 tablet (5 mg total) by mouth 2 (two) times daily.   atenolol (TENORMIN) 25 MG tablet Take 1 tablet (25 mg total) by mouth 2 (two) times daily. (Patient taking differently: Take 25 mg by mouth daily.)   Cholecalciferol (VITAMIN D3) 50 MCG (2000 UT) capsule Take 1 capsule (2,000 Units total) by mouth daily.   ciclopirox (PENLAC) 8 % solution Apply topically at bedtime. Apply over nail and surrounding skin daily   Cyanocobalamin (B-12) 1000 MCG SUBL Place 1,000 mcg under the tongue daily.   flecainide (TAMBOCOR) 50 MG tablet Take 1 tablet (50 mg total) by mouth 2 (two) times daily.   losartan (COZAAR) 25 MG tablet Take 1 tablet (25 mg total) by mouth daily. (Patient taking differently: Take 25 mg by mouth in the morning and at bedtime. Pt only taking 1/2 tab daily)    Multiple Vitamins-Minerals (MULTIVITAMIN WITH MINERALS) tablet Take 1 tablet by mouth daily.   sildenafil (VIAGRA) 100 MG tablet Take 1 tablet (100 mg total) by mouth daily as needed for erectile dysfunction. (Patient taking differently: Take 50 mg by mouth daily as needed for erectile dysfunction.)     Allergies:   Procainamide hcl and Quinidine   Social History   Socioeconomic History   Marital status: Divorced    Spouse name: Not on file   Number of children: Not on file   Years of education: Not on file   Highest education level: Not on file  Occupational History   Not on file  Tobacco Use   Smoking status: Never   Smokeless tobacco: Never  Vaping Use   Vaping Use: Never used  Substance and Sexual Activity   Alcohol use: Yes    Alcohol/week: 8.0 standard  drinks of alcohol    Types: 4 Glasses of wine, 4 Shots of liquor per week    Comment: Social alcohol use   Drug use: No   Sexual activity: Not on file  Other Topics Concern   Not on file  Social History Narrative   Regular Exercise -  YES   Social Determinants of Health   Financial Resource Strain: Low Risk  (07/13/2022)   Overall Financial Resource Strain (CARDIA)    Difficulty of Paying Living Expenses: Not hard at all  Food Insecurity: No Food Insecurity (07/13/2022)   Hunger Vital Sign    Worried About Running Out of Food in the Last Year: Never true    Ran Out of Food in the Last Year: Never true  Transportation Needs: No Transportation Needs (07/13/2022)   PRAPARE - Hydrologist (Medical): No    Lack of Transportation (Non-Medical): No  Physical Activity: Sufficiently Active (07/13/2022)   Exercise Vital Sign    Days of Exercise per Week: 5 days    Minutes of Exercise per Session: 30 min  Stress: No Stress Concern Present (07/13/2022)   [place]    Feeling of Stress : Not at all  Social Connections: [place]  (07/13/2022)   Social Connection and Isolation Panel [NHANES]    Frequency of Communication with Friends and Family: More than three times a week    Frequency of Social Gatherings with Friends and Family: More than three times a week    Attends Religious Services: More than 4 times per year    Active Member of [employer] or Organizations: Yes    Attends Music therapist: More than 4 times per year    Marital Status: Living with partner     Family History: The patient's family history includes Arrhythmia in his mother; Atrial fibrillation in his mother; Cancer in his father; Colon cancer in his paternal uncle; Lung cancer in an other family member; Prostate cancer in an other family member. There is no history of Rectal cancer or Stomach cancer.  ROS:   Please see the history of present illness.    All other systems reviewed and are negative.  EKGs/Labs/Other Studies Reviewed:    The following studies were reviewed today: TTE October 27, 2022:  1. Left ventricular ejection fraction, by estimation, is 55 to 60%. The  left ventricle has normal function. The left ventricle has no regional  wall motion abnormalities. Diastolic function indeterminant due to Afib.   2. Right ventricular systolic function is normal. The right ventricular  size is normal. There is normal pulmonary artery systolic pressure.   3. Left atrial size was moderately dilated.   4. Right atrial size was mildly dilated.   5. The mitral valve is grossly normal. Trivial mitral valve  regurgitation.   6. The aortic valve is tricuspid. There is mild calcification of the  aortic valve. There is mild thickening of the aortic valve. Aortic valve  regurgitation is mild. Aortic valve sclerosis/calcification is present,  without any evidence of aortic  stenosis.   7. Aortic dilatation noted. There is mild dilatation of the aortic root,  measuring 41 mm. There is mild dilatation of the ascending aorta,  measuring 41 mm.   8.  The inferior vena cava is normal in size with greater than 50%  respiratory variability, suggesting right atrial pressure of 3 mmHg.   EKG:  EKG is  ordered today.  Orders placed or performed during the hospital encounter of 12/21/22   EKG 12-Lead pre-cardioversion   EKG 12-Lead   EKG 12-Lead pre-cardioversion   EKG 12-Lead     Recent Labs: 07/03/2022: ALT 10 12/12/2022: BUN 14; Creatinine, Ser 1.10; Hemoglobin 18.5; Platelets 190; Potassium 4.3; Sodium 140    Risk Assessment/Calculations:    CHA2DS2-VASc Score =     This indicates a  % annual risk of stroke. The patient's score is based upon:          Physical Exam:    VS:  BP 114/86   Pulse (!) 114   Ht '5\' 8"'$  (1.727 m)   Wt 173 lb 6.4 oz (78.7 kg)   SpO2 97%   BMI 26.37 kg/m     Wt Readings from Last 3 Encounters:  01/10/23 173 lb 6.4 oz (78.7 kg)  01/01/23 173 lb (78.5 kg)  12/21/22 175 lb (79.4 kg)     GEN:  Well nourished, well developed in no acute distress CARDIAC: Irregular rhythm, no murmurs, rubs, gallops RESPIRATORY:  Normal work of breathing MUSCULOSKELETAL: no edema    ASSESSMENT & PLAN:    In order of problems listed above:  Atrial fibrillation: s/p ablation 08/2022  Atypical atrial flutter: occurred 12/2022 --this is a new atypical flutter that is occurred since his ablation for atrial fibrillation and typical flutter in October 2023.  I think at this point, given multiple flutter circuits and A-fib, we will resume flecainide which he has tolerated well in the past.  We will schedule cardioversion.  If he has recurrence, we will switch to Tikosyn. Secondary hypercoagulable state: continue eliquis HTN: atenolol refilled. He will continue a total daily dose of '50mg'$  until the ablation HLD      Follow-up 1 year  Medication Adjustments/Labs and Tests Ordered: Current medicines are reviewed at length with the patient today.  Concerns regarding medicines are outlined above.  No orders of the  defined types were placed in this encounter.  No orders of the defined types were placed in this encounter.    Signed, Melida Quitter, MD  01/10/2023 10:11 AM    [place]

## 2023-01-10 NOTE — Patient Instructions (Signed)
Medication Instructions:  Your physician recommends that you continue on your current medications as directed. Please refer to the Current Medication list given to you today.  *If you need a refill on your cardiac medications before your next appointment, please call your pharmacy*   Lab Work: None ordered   Testing/Procedures: Your physician has recommended that you have a Cardioversion (DCCV). Electrical Cardioversion uses a jolt of electricity to your heart either through paddles or wired patches attached to your chest. This is a controlled, usually prescheduled, procedure. Defibrillation is done under light anesthesia in the hospital, and you usually go home the day of the procedure. This is done to get your heart back into a normal rhythm. You are not awake for the procedure. Please see the instructions below.   Follow-Up: At [facility], you and your health needs are our priority.  As part of our continuing mission to provide you with exceptional heart care, we have created designated Provider Care Teams.  These Care Teams include your primary Cardiologist (physician) and Advanced Practice Providers (APPs -  Physician Assistants and Nurse Practitioners) who all work together to provide you with the care you need, when you need it.  Your next appointment:   First week/two of April   The format for your next appointment:   In Person  Provider:   You will follow up in the [facility] located at [facility]. Your provider will be: Clint R. Fenton, PA-C{    Thank you for choosing [facility]!!   [phone]  Other Instructions      Dear Evan Farmer  You are scheduled for a Cardioversion on Tuesday, March 19 with Dr. Debara Pickett.  Please arrive at the [facility] (Main Entrance A) at [facility]: [address] [place], [place] [address] at 9:30 AM.    DIET:  Nothing to eat or drink after midnight except a sip of water with medications  (see medication instructions below)  MEDICATION INSTRUCTIONS: HOLD your morning medications -- ONLY TAKE YOUR ELIQUIS  Continue taking your anticoagulant (blood thinner): Apixaban (Eliquis).  You will need to continue this after your procedure until you are told by your provider that it is safe to stop.    LABS:  Your labs will be done at the hospital prior to your procedure.  FYI:  For your safety, and to allow [place] to monitor your vital signs accurately during the surgery/procedure we request: If you have artificial nails, gel coating, SNS etc, please have those removed prior to your surgery/procedure. Not having the nail coverings /polish removed may result in cancellation or delay of your surgery/procedure.  You must have a responsible person to drive you home and stay in the waiting area during your procedure. Failure to do so could result in cancellation.  Bring your insurance cards.  *Special Note: Every effort is made to have your procedure done on time. Occasionally there are emergencies that occur at the hospital that may cause delays. Please be patient if a delay does occur.

## 2023-01-14 NOTE — Addendum Note (Signed)
Addended by: Michelle Nasuti on: 01/14/2023 06:55 AM   Modules accepted: Orders

## 2023-01-15 ENCOUNTER — Other Ambulatory Visit: Payer: Self-pay | Admitting: Cardiovascular Disease

## 2023-01-15 DIAGNOSIS — I10 Essential (primary) hypertension: Secondary | ICD-10-CM

## 2023-01-15 MED ORDER — LOSARTAN POTASSIUM 25 MG PO TABS: 25.0000 mg | ORAL_TABLET | Freq: Every day | ORAL | 3 refills | Status: DC

## 2023-01-15 NOTE — Progress Notes (Signed)
Patient unable to fill Losartan Rx at [place], resent to [facility] per patient request.

## 2023-01-29 ENCOUNTER — Ambulatory Visit (HOSPITAL_COMMUNITY)
Admission: RE | Admit: 2023-01-29 | Discharge: 2023-01-29 | Disposition: A | Discharge status: Home / Self Care | Payer: 59 | Attending: Internal Medicine | Admitting: Internal Medicine

## 2023-01-29 ENCOUNTER — Ambulatory Visit (HOSPITAL_COMMUNITY): Payer: 59 | Admitting: Certified Registered"

## 2023-01-29 ENCOUNTER — Ambulatory Visit (HOSPITAL_BASED_OUTPATIENT_CLINIC_OR_DEPARTMENT_OTHER): Payer: 59 | Admitting: Certified Registered"

## 2023-01-29 ENCOUNTER — Encounter (HOSPITAL_COMMUNITY): Payer: Self-pay | Admitting: Internal Medicine

## 2023-01-29 ENCOUNTER — Other Ambulatory Visit: Payer: Self-pay

## 2023-01-29 ENCOUNTER — Encounter (HOSPITAL_COMMUNITY)
Admission: RE | Disposition: A | Discharge status: Home / Self Care | Payer: Self-pay | Source: Home / Self Care | Attending: Internal Medicine

## 2023-01-29 DIAGNOSIS — I2510 Atherosclerotic heart disease of native coronary artery without angina pectoris: Secondary | ICD-10-CM

## 2023-01-29 DIAGNOSIS — F418 Other specified anxiety disorders: Secondary | ICD-10-CM | POA: Diagnosis not present

## 2023-01-29 DIAGNOSIS — I484 Atypical atrial flutter: Secondary | ICD-10-CM | POA: Diagnosis not present

## 2023-01-29 DIAGNOSIS — I4892 Unspecified atrial flutter: Secondary | ICD-10-CM | POA: Diagnosis present

## 2023-01-29 DIAGNOSIS — I480 Paroxysmal atrial fibrillation: Secondary | ICD-10-CM | POA: Insufficient documentation

## 2023-01-29 DIAGNOSIS — I10 Essential (primary) hypertension: Secondary | ICD-10-CM | POA: Diagnosis not present

## 2023-01-29 DIAGNOSIS — Z8673 Personal history of transient ischemic attack (TIA), and cerebral infarction without residual deficits: Secondary | ICD-10-CM | POA: Diagnosis not present

## 2023-01-29 DIAGNOSIS — D6869 Other thrombophilia: Secondary | ICD-10-CM | POA: Insufficient documentation

## 2023-01-29 DIAGNOSIS — F419 Anxiety disorder, unspecified: Secondary | ICD-10-CM | POA: Insufficient documentation

## 2023-01-29 DIAGNOSIS — Z79899 Other long term (current) drug therapy: Secondary | ICD-10-CM | POA: Insufficient documentation

## 2023-01-29 DIAGNOSIS — I483 Typical atrial flutter: Secondary | ICD-10-CM

## 2023-01-29 DIAGNOSIS — Z7901 Long term (current) use of anticoagulants: Secondary | ICD-10-CM | POA: Diagnosis not present

## 2023-01-29 HISTORY — PX: CARDIOVERSION: SHX1299

## 2023-01-29 LAB — POCT I-STAT, CHEM 8
BUN: 15 mg/dL (ref 8–23)
Calcium, Ion: 1.13 mmol/L — ABNORMAL LOW (ref 1.15–1.40)
Chloride: 103 mmol/L (ref 98–111)
Creatinine, Ser: 1 mg/dL (ref 0.61–1.24)
Glucose, Bld: 101 mg/dL — ABNORMAL HIGH (ref 70–99)
HCT: 48 % (ref 39.0–52.0)
Hemoglobin: 16.3 g/dL (ref 13.0–17.0)
Potassium: 4.6 mmol/L (ref 3.5–5.1)
Sodium: 140 mmol/L (ref 135–145)
TCO2: 29 mmol/L (ref 22–32)

## 2023-01-29 SURGERY — CARDIOVERSION
Anesthesia: General

## 2023-01-29 MED ORDER — PROPOFOL 10 MG/ML IV BOLUS
INTRAVENOUS | Status: DC | PRN
  Administered 2023-01-29: 100 mg via INTRAVENOUS

## 2023-01-29 MED ORDER — SODIUM CHLORIDE 0.9 % IV SOLN: INTRAVENOUS | Status: DC

## 2023-01-29 MED ORDER — LIDOCAINE HCL (CARDIAC) PF 100 MG/5ML IV SOSY
PREFILLED_SYRINGE | INTRAVENOUS | Status: DC | PRN
  Administered 2023-01-29: 80 mg via INTRAVENOUS

## 2023-01-29 NOTE — Interval H&P Note (Signed)
History and Physical Interval Note:  01/29/2023 10:12 AM  Evan Farmer  has presented today for surgery, with the diagnosis of AFIB.  The various methods of treatment have been discussed with the patient and family. After consideration of risks, benefits and other options for treatment, the patient has consented to  Procedure(s): CARDIOVERSION (N/A) as a surgical intervention.  The patient's history has been reviewed, patient examined, no change in status, stable for surgery.  I have reviewed the patient's chart and labs.  Questions were answered to the patient's satisfaction.     [name]

## 2023-01-29 NOTE — Transfer of Care (Signed)
Immediate Anesthesia Transfer of Care Note  Patient: Evan Farmer  Procedure(s) Performed: CARDIOVERSION  Patient Location: PACU and Endoscopy Unit  Anesthesia Type:General  Level of Consciousness: drowsy  Airway & Oxygen Therapy: Patient Spontanous Breathing and Patient connected to nasal cannula oxygen  Post-op Assessment: Report given to RN and Post -op Vital signs reviewed and stable  Post vital signs: Reviewed and stable  Last Vitals:  Vitals Value Taken Time  BP    Temp    Pulse    Resp    SpO2      Last Pain:  Vitals:   01/29/23 0925  TempSrc: Tympanic  PainSc: 0-No pain         Complications: No notable events documented.

## 2023-01-29 NOTE — Discharge Instructions (Signed)

## 2023-01-29 NOTE — Anesthesia Postprocedure Evaluation (Signed)
Anesthesia Post Note  Patient: Evan Farmer  Procedure(s) Performed: CARDIOVERSION     Patient location during evaluation: PACU Anesthesia Type: General Level of consciousness: awake and alert Pain management: pain level controlled Vital Signs Assessment: post-procedure vital signs reviewed and stable Respiratory status: spontaneous breathing, nonlabored ventilation and respiratory function stable Cardiovascular status: blood pressure returned to baseline and stable Postop Assessment: no apparent nausea or vomiting Anesthetic complications: no   No notable events documented.  Last Vitals:  Vitals:   01/29/23 1110 01/29/23 1120  BP: 118/79 114/79  Pulse: 64 71  Resp: 15 19  Temp:    SpO2: 96% 97%    Last Pain:  Vitals:   01/29/23 1120  TempSrc:   PainSc: 0-No pain                 Lynda Rainwater

## 2023-01-29 NOTE — Anesthesia Preprocedure Evaluation (Signed)
Anesthesia Evaluation  Patient identified by MRN, date of birth, ID band Patient awake    Reviewed: Allergy & Precautions, NPO status , Patient's Chart, lab work & pertinent test results  Airway Mallampati: II  TM Distance: >3 FB Neck ROM: Full  Mouth opening: Limited Mouth Opening  Dental  (+) Dental Advisory Given, Poor Dentition, Missing, Caps   Pulmonary neg pulmonary ROS   breath sounds clear to auscultation       Cardiovascular hypertension, Pt. on home beta blockers and Pt. on medications + CAD  + dysrhythmias Atrial Fibrillation  Rhythm:Regular Rate:Normal     Neuro/Psych  PSYCHIATRIC DISORDERS Anxiety Depression    TIA   GI/Hepatic negative GI ROS, Neg liver ROS,,,  Endo/Other  negative endocrine ROS    Renal/GU negative Renal ROS     Musculoskeletal   Abdominal   Peds  Hematology negative hematology ROS (+)   Anesthesia Other Findings   Reproductive/Obstetrics                             Anesthesia Physical Anesthesia Plan  ASA: 3  Anesthesia Plan: General   Post-op Pain Management: Minimal or no pain anticipated   Induction: Intravenous  PONV Risk Score and Plan: 2 and Treatment may vary due to age or medical condition and Propofol infusion  Airway Management Planned: Nasal Cannula  Additional Equipment: None  Intra-op Plan:   Post-operative Plan:   Informed Consent: I have reviewed the patients History and Physical, chart, labs and discussed the procedure including the risks, benefits and alternatives for the proposed anesthesia with the patient or authorized representative who has indicated his/her understanding and acceptance.       Plan Discussed with: Anesthesiologist and CRNA  Anesthesia Plan Comments: (  )        Anesthesia Quick Evaluation

## 2023-01-29 NOTE — CV Procedure (Signed)
    CARDIOVERSION NOTE  Procedure: Electrical Cardioversion Indications:  Atrial Flutter  Procedure Details:  Consent: Risks of procedure as well as the alternatives and risks of each were explained to the (patient/caregiver).  Consent for procedure obtained.  Time Out: Verified patient identification, verified procedure, site/side was marked, verified correct patient position, special equipment/implants available, medications/allergies/relevent history reviewed, required imaging and test results available.  Performed  Patient placed on cardiac monitor, pulse oximetry, supplemental oxygen as necessary.  Sedation given:  propofol per anesthesia Pacer pads placed anterior and posterior chest.  Cardioverted 1 time(s).  Cardioverted at 150J biphasic.  Impression: Findings: Post procedure EKG shows: NSR Complications: None Patient did tolerate procedure well.  Plan: Successful DCCV with a single 150J biphasic shock to NSR.  Time Spent Directly with the Patient:  30 minutes   [name], MD, [facility], [place] Director of the Advanced Lipid Disorders &  Cardiovascular Risk Reduction Clinic Diplomate of the American Board of Clinical Lipidology Attending Cardiologist  Direct Dial: [phone]  Fax: [phone]  Website:  [email or web address] Charmane Protzman 01/29/2023, 11:01 AM

## 2023-01-30 ENCOUNTER — Encounter (HOSPITAL_COMMUNITY): Payer: Self-pay | Admitting: Internal Medicine

## 2023-02-14 ENCOUNTER — Ambulatory Visit (HOSPITAL_COMMUNITY)
Admission: RE | Admit: 2023-02-14 | Discharge: 2023-02-14 | Disposition: A | Discharge status: Home / Self Care | Payer: 59 | Source: Ambulatory Visit | Attending: Physician Assistant | Admitting: Physician Assistant

## 2023-02-14 ENCOUNTER — Encounter (HOSPITAL_COMMUNITY): Payer: Self-pay | Admitting: Physician Assistant

## 2023-02-14 VITALS — BP 132/82 | HR 54 | Ht 68.0 in | Wt 177.6 lb

## 2023-02-14 DIAGNOSIS — I4892 Unspecified atrial flutter: Secondary | ICD-10-CM | POA: Insufficient documentation

## 2023-02-14 DIAGNOSIS — I4819 Other persistent atrial fibrillation: Secondary | ICD-10-CM | POA: Insufficient documentation

## 2023-02-14 DIAGNOSIS — E785 Hyperlipidemia, unspecified: Secondary | ICD-10-CM | POA: Insufficient documentation

## 2023-02-14 DIAGNOSIS — Z7901 Long term (current) use of anticoagulants: Secondary | ICD-10-CM | POA: Diagnosis not present

## 2023-02-14 DIAGNOSIS — Z5181 Encounter for therapeutic drug level monitoring: Secondary | ICD-10-CM

## 2023-02-14 DIAGNOSIS — R001 Bradycardia, unspecified: Secondary | ICD-10-CM | POA: Diagnosis not present

## 2023-02-14 DIAGNOSIS — I10 Essential (primary) hypertension: Secondary | ICD-10-CM | POA: Diagnosis not present

## 2023-02-14 DIAGNOSIS — D6869 Other thrombophilia: Secondary | ICD-10-CM | POA: Diagnosis not present

## 2023-02-14 DIAGNOSIS — Z79899 Other long term (current) drug therapy: Secondary | ICD-10-CM | POA: Diagnosis not present

## 2023-02-14 DIAGNOSIS — Z8673 Personal history of transient ischemic attack (TIA), and cerebral infarction without residual deficits: Secondary | ICD-10-CM | POA: Insufficient documentation

## 2023-02-14 MED ORDER — APIXABAN 5 MG PO TABS: 5.0000 mg | ORAL_TABLET | Freq: Two times a day (BID) | ORAL | 2 refills | Status: DC

## 2023-02-14 NOTE — Progress Notes (Signed)
Primary Care Physician: Cassandria Anger, MD Referring Physician: self referred Primary EP: Dr. Carlynn Purl is a 72 y.o. male with a h/o afib, atrial flutter, HTN, HLD, TIA who presents for follow up in the [facility]. Patient had been maintained on flecainide for several years and had an ablation at [facility] in 1992. He underwent repeat ablation with Dr Myles Gip for afib and atrial flutter 08/22/22. He was found to be in atypical atrial flutter 12/12/22 and underwent TEE/DCCV on 12/21/22. Unfortunately, he had quick return of his arrhythmia and flecainide was resumed with repeat DCCV on 01/29/23. He is on Eliquis for a CHADS2VASc score of 4.   On follow up today, patient reports that he has done well since the DCCV. He has a new Apple Watch which has shown only SR. He denies any bleeding issues on anticoagulation.   Today, he denies symptoms of palpitations, chest pain, shortness of breath, orthopnea, PND, lower extremity edema, dizziness, presyncope, syncope, or neurologic sequela. The patient is tolerating medications without difficulties and is otherwise without complaint today.   Past Medical History:  Diagnosis Date   Anxiety    Cataract    Depression    Diverticulosis    HTN (hypertension)    Hyperlipidemia    Paroxysmal A-fib    Prostate cancer    2010 Dr Terance Hart    [facility] spotted fever    age 83   S/P cardiac cath 02/2011   No obstructive disease. Normal LV function   Scoliosis    TIA (transient ischemic attack)    2012   Tubular adenoma of colon    Vitamin B 12 deficiency    Vitamin D deficiency    Weight gain    Past Surgical History:  Procedure Laterality Date   ATRIAL FIBRILLATION ABLATION N/A 08/22/2022   Procedure: ATRIAL FIBRILLATION ABLATION;  Surgeon: Melida Quitter, MD;  Location: [facility];  Service: Cardiovascular;  Laterality: N/A;   CARDIOVERSION N/A 07/10/2016   Procedure: CARDIOVERSION;   Surgeon: Lelon Perla, MD;  Location: [facility] ENDOSCOPY;  Service: Cardiovascular;  Laterality: N/A;   CARDIOVERSION N/A 12/21/2022   Procedure: CARDIOVERSION;  Surgeon: Buford Dresser, MD;  Location: [facility] ENDOSCOPY;  Service: Cardiovascular;  Laterality: N/A;   CARDIOVERSION N/A 01/29/2023   Procedure: CARDIOVERSION;  Surgeon: [name], MD;  Location: [facility] ENDOSCOPY;  Service: Cardiovascular;  Laterality: N/A;   CATARACT EXTRACTION W/ INTRAOCULAR LENS  IMPLANT, BILATERAL     COLONOSCOPY  01-24-2005   TICS ONLY    COLONOSCOPY WITH PROPOFOL N/A 12/19/2021   Procedure: COLONOSCOPY WITH PROPOFOL;  Surgeon: Jerene Bears, MD;  Location: [facility] ENDOSCOPY;  Service: Gastroenterology;  Laterality: N/A;   ENDOSCOPIC MUCOSAL RESECTION N/A 12/19/2021   Procedure: ENDOSCOPIC MUCOSAL RESECTION;  Surgeon: Jerene Bears, MD;  Location: [facility] ENDOSCOPY;  Service: Gastroenterology;  Laterality: N/A;   HEMOSTASIS CLIP PLACEMENT  12/19/2021   Procedure: HEMOSTASIS CLIP PLACEMENT;  Surgeon: Jerene Bears, MD;  Location: [facility] ENDOSCOPY;  Service: Gastroenterology;;   [place] and 2003   twice   NASAL SEPTUM SURGERY     POLYPECTOMY  12/19/2021   Procedure: POLYPECTOMY;  Surgeon: Jerene Bears, MD;  Location: [facility] ENDOSCOPY;  Service: Gastroenterology;;   PROSTATECTOMY  11/2008   Robotic    SUBMUCOSAL LIFTING INJECTION  12/19/2021   Procedure: SUBMUCOSAL LIFTING INJECTION;  Surgeon: Jerene Bears, MD;  Location: [facility] ENDOSCOPY;  Service: Gastroenterology;;   SUBMUCOSAL TATTOO INJECTION  12/19/2021   Procedure: SUBMUCOSAL TATTOO INJECTION;  Surgeon: Jerene Bears, MD;  Location: [facility] ENDOSCOPY;  Service: Gastroenterology;;   TEE WITHOUT CARDIOVERSION N/A 07/10/2016   Procedure: TRANSESOPHAGEAL ECHOCARDIOGRAM (TEE);  Surgeon: Lelon Perla, MD;  Location: [facility] ENDOSCOPY;  Service: Cardiovascular;  Laterality: N/A;   TEE WITHOUT CARDIOVERSION N/A 12/21/2022   Procedure: TRANSESOPHAGEAL ECHOCARDIOGRAM  (TEE);  Surgeon: Buford Dresser, MD;  Location: [facility] ENDOSCOPY;  Service: Cardiovascular;  Laterality: N/A;    Current Outpatient Medications  Medication Sig Dispense Refill   ALPRAZolam (XANAX) 0.5 MG tablet TAKE 1 TABLET TWICE A DAY  AS NEEDED FOR ANXIETY (Patient taking differently: Take 0.5 mg by mouth daily as needed for sleep (pt takes 1/2 as needed at bedtime).) 180 tablet 1   atenolol (TENORMIN) 25 MG tablet Take 1 tablet (25 mg total) by mouth 2 (two) times daily. 180 tablet 3   Cholecalciferol (VITAMIN D3) 50 MCG (2000 UT) capsule Take 1 capsule (2,000 Units total) by mouth daily. 100 capsule 3   ciclopirox (PENLAC) 8 % solution Apply topically at bedtime. Apply over nail and surrounding skin daily 6.6 mL 1   Cyanocobalamin (B-12) 1000 MCG SUBL Place 1,000 mcg under the tongue daily.     flecainide (TAMBOCOR) 50 MG tablet Take 1 tablet (50 mg total) by mouth 2 (two) times daily. 180 tablet 1   losartan (COZAAR) 25 MG tablet Take 1 tablet (25 mg total) by mouth daily. (Patient taking differently: Take 12.5-25 mg by mouth daily. Depending on Blood pressure) 90 tablet 3   Multiple Vitamins-Minerals (MULTIVITAMIN WITH MINERALS) tablet Take 1 tablet by mouth daily.     sildenafil (VIAGRA) 100 MG tablet Take 1 tablet (100 mg total) by mouth daily as needed for erectile dysfunction. (Patient taking differently: Take 50 mg by mouth daily as needed for erectile dysfunction.) 12 tablet 5   apixaban (ELIQUIS) 5 MG TABS tablet Take 1 tablet (5 mg total) by mouth 2 (two) times daily. 180 tablet 2   No current facility-administered medications for this encounter.    Allergies  Allergen Reactions   Procainamide Hcl Other (See Comments)     sun dermatitis   Quinidine Other (See Comments)    High fever    Social History   Socioeconomic History   Marital status: Divorced    Spouse name: Not on file   Number of children: Not on file   Years of education: Not on file   Highest education  level: Not on file  Occupational History   Not on file  Tobacco Use   Smoking status: Never   Smokeless tobacco: Never   Tobacco comments:    Never smoke 02/14/23  Vaping Use   Vaping Use: Never used  Substance and Sexual Activity   Alcohol use: Yes    Alcohol/week: 8.0 standard drinks of alcohol    Types: 4 Glasses of wine, 4 Shots of liquor per week    Comment: Social alcohol use   Drug use: No   Sexual activity: Not on file  Other Topics Concern   Not on file  Social History Narrative   Regular Exercise -  YES   Social Determinants of Health   Financial Resource Strain: Low Risk  (07/13/2022)   Overall Financial Resource Strain (CARDIA)    Difficulty of Paying Living Expenses: Not hard at all  Food Insecurity: No Food Insecurity (07/13/2022)   Hunger Vital Sign  Worried About Charity fundraiser in the Last Year: Never true    [place] in the Last Year: Never true  Transportation Needs: No Transportation Needs (07/13/2022)   PRAPARE - Hydrologist (Medical): No    Lack of Transportation (Non-Medical): No  Physical Activity: Sufficiently Active (07/13/2022)   Exercise Vital Sign    Days of Exercise per Week: 5 days    Minutes of Exercise per Session: 30 min  Stress: No Stress Concern Present (07/13/2022)   [place]    Feeling of Stress : Not at all  Social Connections: Socially Integrated (07/13/2022)   Social Connection and Isolation Panel [NHANES]    Frequency of Communication with Friends and Family: More than three times a week    Frequency of Social Gatherings with Friends and Family: More than three times a week    Attends Religious Services: More than 4 times per year    Active Member of Clubs or Organizations: Yes    Attends Archivist Meetings: More than 4 times per year    Marital Status: Living with partner  Intimate Partner Violence: Not At Risk  (07/13/2022)   Humiliation, Afraid, Rape, and Kick questionnaire    Fear of Current or Ex-Partner: No    Emotionally Abused: No    Physically Abused: No    Sexually Abused: No    Family History  Problem Relation Age of Onset   Arrhythmia Mother    Atrial fibrillation Mother    Cancer Father    Lung cancer Other    Prostate cancer Other    Colon cancer Paternal Uncle    Rectal cancer Neg Hx    Stomach cancer Neg Hx     ROS- All systems are reviewed and negative except as per the HPI above  Physical Exam: Vitals:   02/14/23 0931  BP: 132/82  Pulse: (!) 54  Weight: 80.6 kg  Height: 5\' 8"  (1.727 m)    Wt Readings from Last 3 Encounters:  02/14/23 80.6 kg  01/29/23 78 kg  01/10/23 78.7 kg    Labs: Lab Results  Component Value Date   NA 140 01/29/2023   K 4.6 01/29/2023   CL 103 01/29/2023   CO2 24 12/12/2022   GLUCOSE 101 (H) 01/29/2023   BUN 15 01/29/2023   CREATININE 1.00 01/29/2023   CALCIUM 9.1 12/12/2022   Lab Results  Component Value Date   INR 0.95 02/12/2011   Lab Results  Component Value Date   CHOL 197 07/03/2022   HDL 41.40 07/03/2022   LDLCALC 130 (H) 07/03/2022   TRIG 132.0 07/03/2022     GEN- The patient is a well appearing male, alert and oriented x 3 today.   HEENT-head normocephalic, atraumatic, sclera clear, conjunctiva pink, hearing intact, trachea midline. Lungs- Clear to ausculation bilaterally, normal work of breathing Heart- Regular rate and rhythm, no murmurs, rubs or gallops  GI- soft, NT, ND, + BS Extremities- no clubbing, cyanosis, or edema MS- no significant deformity or atrophy Skin- no rash or lesion Psych- euthymic mood, full affect Neuro- strength and sensation are intact   EKG today demonstrates SB Vent. rate 54 BPM PR interval 180 ms QRS duration 94 ms QT/QTcB 424/402 ms  Echo 07/24/22  1. Left ventricular ejection fraction, by estimation, is 55 to 60%. The left ventricle has normal function. The left  ventricle has no regional wall motion abnormalities. Diastolic  function indeterminant due to Afib.   2. Right ventricular systolic function is normal. The right ventricular size is normal. There is normal pulmonary artery systolic pressure.   3. Left atrial size was moderately dilated.   4. Right atrial size was mildly dilated.   5. The mitral valve is grossly normal. Trivial mitral valve  regurgitation.   6. The aortic valve is tricuspid. There is mild calcification of the  aortic valve. There is mild thickening of the aortic valve. Aortic valve regurgitation is mild. Aortic valve sclerosis/calcification is present, without any evidence of aortic stenosis.   7. Aortic dilatation noted. There is mild dilatation of the aortic root, measuring 41 mm. There is mild dilatation of the ascending aorta, measuring 41 mm.   8. The inferior vena cava is normal in size with greater than 50%  respiratory variability, suggesting right atrial pressure of 3 mmHg.   Comparison(s): [place]ard to prior TEE report in 2017, there is no  significant change.    CHA2DS2-VASc Score = 4  The patient's score is based upon: CHF History: 0 HTN History: 1 Diabetes History: 0 Stroke History: 2 (TIA) Vascular Disease History: 0 Age Score: 1 Gender Score: 0       ASSESSMENT AND PLAN: 1. Persistent Atrial Fibrillation/atrial flutter The patient's CHA2DS2-VASc score is 4, indicating a 4.8% annual risk of stroke.   S/p ablation 1992 and 08/22/22 afib and flutter S/p DCCV 01/29/23 Patient appears to be maintaining SR.  Continue flecainide 50 mg BID Continue atenolol 25 mg BID Continue Eliquis 5 mg BID  2. Secondary Hypercoagulable State (ICD10:  D68.69) The patient is at significant risk for stroke/thromboembolism based upon his CHA2DS2-VASc Score of 4.  Continue Apixaban (Eliquis).   3. HTN Stable, no changes today.   Follow up in the AF clinic in 6 months.    [facility] [address] [place], [place] [address] [phone]

## 2023-02-22 LAB — ECHO TEE

## 2023-05-09 ENCOUNTER — Telehealth (HOSPITAL_COMMUNITY): Payer: Self-pay | Admitting: *Deleted

## 2023-05-09 NOTE — Telephone Encounter (Signed)
Patient called stating he went back into Afib this morning.Prior to calling clinic he took an extra 25mg  of atenolol and 50mg  of flecainide.  After several hours his heart rates are 90-140 BP 118/62. Discussed with Jorja Loa PA will increase atenolol to 50mg  BID no more extra flecainide. If still in AF next week call back for appt for assessment. Pt in agreement.

## 2023-05-21 ENCOUNTER — Ambulatory Visit (HOSPITAL_COMMUNITY)
Admission: RE | Admit: 2023-05-21 | Discharge: 2023-05-21 | Disposition: A | Discharge status: Home / Self Care | Payer: 59 | Source: Ambulatory Visit | Attending: Internal Medicine | Admitting: Internal Medicine

## 2023-05-21 VITALS — BP 118/80 | HR 113 | Ht 68.0 in | Wt 170.8 lb

## 2023-05-21 DIAGNOSIS — Z8673 Personal history of transient ischemic attack (TIA), and cerebral infarction without residual deficits: Secondary | ICD-10-CM | POA: Diagnosis not present

## 2023-05-21 DIAGNOSIS — I480 Paroxysmal atrial fibrillation: Secondary | ICD-10-CM

## 2023-05-21 DIAGNOSIS — D6869 Other thrombophilia: Secondary | ICD-10-CM | POA: Insufficient documentation

## 2023-05-21 DIAGNOSIS — I10 Essential (primary) hypertension: Secondary | ICD-10-CM | POA: Diagnosis not present

## 2023-05-21 DIAGNOSIS — I4819 Other persistent atrial fibrillation: Secondary | ICD-10-CM | POA: Insufficient documentation

## 2023-05-21 DIAGNOSIS — Z7901 Long term (current) use of anticoagulants: Secondary | ICD-10-CM | POA: Diagnosis not present

## 2023-05-21 DIAGNOSIS — I4892 Unspecified atrial flutter: Secondary | ICD-10-CM | POA: Diagnosis not present

## 2023-05-21 LAB — BASIC METABOLIC PANEL
Anion gap: 8 (ref 5–15)
BUN: 23 mg/dL (ref 8–23)
CO2: 30 mmol/L (ref 22–32)
Calcium: 8.9 mg/dL (ref 8.9–10.3)
Chloride: 101 mmol/L (ref 98–111)
Creatinine, Ser: 1.21 mg/dL (ref 0.61–1.24)
GFR, Estimated: >60 mL/min (ref >60)
Glucose, Bld: 108 mg/dL — ABNORMAL HIGH (ref 70–99)
Potassium: 4.7 mmol/L (ref 3.5–5.1)
Sodium: 139 mmol/L (ref 135–145)

## 2023-05-21 LAB — CBC
HCT: 49.9 % (ref 39.0–52.0)
Hemoglobin: 17 g/dL (ref 13.0–17.0)
MCH: 33.5 pg (ref 26.0–34.0)
MCHC: 34.1 g/dL (ref 30.0–36.0)
MCV: 98.4 fL (ref 80.0–100.0)
Platelets: 147 10*3/uL — ABNORMAL LOW (ref 150–400)
RBC: 5.07 MIL/uL (ref 4.22–5.81)
RDW: 12.3 % (ref 11.5–15.5)
WBC: 4.2 10*3/uL (ref 4.0–10.5)
nRBC: 0 % (ref 0.0–0.2)

## 2023-05-21 MED ORDER — ATENOLOL 25 MG PO TABS
25.0000 mg | ORAL_TABLET | Freq: Two times a day (BID) | ORAL | 3 refills | Status: DC
Start: 2023-05-21 — End: 2023-09-02

## 2023-05-21 NOTE — Progress Notes (Signed)
Primary Care Physician: [name], MD Referring Physician: self referred Primary EP: Dr. Candelaria Celeste is a 72 y.o. male with a h/o afib, atrial flutter, HTN, HLD, TIA who presents for follow up in the [facility]. Patient had been maintained on flecainide for several years and had an ablation at [facility] in 1992. He underwent repeat ablation with Dr [name] for afib and atrial flutter 08/22/22. He was found to be in atypical atrial flutter 12/12/22 and underwent TEE/DCCV on 12/21/22. Unfortunately, he had quick return of his arrhythmia and flecainide was resumed with repeat DCCV on 01/29/23. He is on Eliquis for a CHADS2VASc score of 4.   On follow up today, patient reports that he has done well since the DCCV. He has a new Apple Watch which has shown only SR. He denies any bleeding issues on anticoagulation.   On follow up 05/21/23, he is currently in atrial flutter with RVR. Patient called on 6/27 noting he went back into Afib and took an extra 25 mg of atenolol and 50 mg of flecainide. He was advised to take atenolol 50 mg BID and no additional flecainide. He notes his HR at home will be 70-90s with current regimen. He does not feel especially bad but notes feeling tired with activity. He missed a dose of Eliquis last week - taking two full doses since 7/5.   Today, he denies symptoms of palpitations, chest pain, shortness of breath, orthopnea, PND, lower extremity edema, dizziness, presyncope, syncope, or neurologic sequela. The patient is tolerating medications without difficulties and is otherwise without complaint today.   Past Medical History:  Diagnosis Date   Anxiety    Cataract    Depression    Diverticulosis    HTN (hypertension)    Hyperlipidemia    Paroxysmal A-fib (HCC)    Prostate cancer [facility])    2010 [name]    [facility] spotted fever    age 49   S/P cardiac cath 02/2011   No obstructive disease. Normal LV function    Scoliosis    TIA (transient ischemic attack)    2012   Tubular adenoma of colon    Vitamin B 12 deficiency    Vitamin D deficiency    Weight gain    Past Surgical History:  Procedure Laterality Date   ATRIAL FIBRILLATION ABLATION N/A 08/22/2022   Procedure: ATRIAL FIBRILLATION ABLATION;  Surgeon: Maurice Small, MD;  Location: [facility] INVASIVE CV LAB;  Service: Cardiovascular;  Laterality: N/A;   CARDIOVERSION N/A 07/10/2016   Procedure: CARDIOVERSION;  Surgeon: Lewayne Bunting, MD;  Location: [facility] ENDOSCOPY;  Service: Cardiovascular;  Laterality: N/A;   CARDIOVERSION N/A 12/21/2022   Procedure: CARDIOVERSION;  Surgeon: Jodelle Red, MD;  Location: [facility] ENDOSCOPY;  Service: Cardiovascular;  Laterality: N/A;   CARDIOVERSION N/A 01/29/2023   Procedure: CARDIOVERSION;  Surgeon: Chrystie Nose, MD;  Location: [facility] ENDOSCOPY;  Service: Cardiovascular;  Laterality: N/A;   CATARACT EXTRACTION W/ INTRAOCULAR LENS  IMPLANT, BILATERAL     COLONOSCOPY  01-24-2005   TICS ONLY    COLONOSCOPY WITH PROPOFOL N/A 12/19/2021   Procedure: COLONOSCOPY WITH PROPOFOL;  Surgeon: Beverley Fiedler, MD;  Location: [facility] ENDOSCOPY;  Service: Gastroenterology;  Laterality: N/A;   ENDOSCOPIC MUCOSAL RESECTION N/A 12/19/2021   Procedure: ENDOSCOPIC MUCOSAL RESECTION;  Surgeon: Beverley Fiedler, MD;  Location: [facility] ENDOSCOPY;  Service: Gastroenterology;  Laterality: N/A;   HEMOSTASIS CLIP PLACEMENT  12/19/2021   Procedure:  HEMOSTASIS CLIP PLACEMENT;  Surgeon: Beverley Fiedler, MD;  Location: [facility] ENDOSCOPY;  Service: Gastroenterology;;   HERNIA REPAIR     HERNIA REPAIR  1995 and 2003   twice   NASAL SEPTUM SURGERY     POLYPECTOMY  12/19/2021   Procedure: POLYPECTOMY;  Surgeon: Beverley Fiedler, MD;  Location: [facility] ENDOSCOPY;  Service: Gastroenterology;;   PROSTATECTOMY  11/2008   Robotic    SUBMUCOSAL LIFTING INJECTION  12/19/2021   Procedure: SUBMUCOSAL LIFTING INJECTION;  Surgeon: Beverley Fiedler, MD;  Location: [facility] ENDOSCOPY;  Service:  Gastroenterology;;   SUBMUCOSAL TATTOO INJECTION  12/19/2021   Procedure: SUBMUCOSAL TATTOO INJECTION;  Surgeon: Beverley Fiedler, MD;  Location: [facility] ENDOSCOPY;  Service: Gastroenterology;;   TEE WITHOUT CARDIOVERSION N/A 07/10/2016   Procedure: TRANSESOPHAGEAL ECHOCARDIOGRAM (TEE);  Surgeon: Lewayne Bunting, MD;  Location: [facility] ENDOSCOPY;  Service: Cardiovascular;  Laterality: N/A;   TEE WITHOUT CARDIOVERSION N/A 12/21/2022   Procedure: TRANSESOPHAGEAL ECHOCARDIOGRAM (TEE);  Surgeon: Jodelle Red, MD;  Location: [facility] ENDOSCOPY;  Service: Cardiovascular;  Laterality: N/A;    Current Outpatient Medications  Medication Sig Dispense Refill   ALPRAZolam (XANAX) 0.5 MG tablet TAKE 1 TABLET TWICE A DAY  AS NEEDED FOR ANXIETY (Patient taking differently: Take 0.5 mg by mouth daily as needed for sleep (pt takes 1/2 as needed at bedtime).) 180 tablet 1   apixaban (ELIQUIS) 5 MG TABS tablet Take 1 tablet (5 mg total) by mouth 2 (two) times daily. 180 tablet 2   atenolol (TENORMIN) 25 MG tablet Take 1 tablet (25 mg total) by mouth 2 (two) times daily. 180 tablet 3   Cholecalciferol (VITAMIN D3) 50 MCG (2000 UT) capsule Take 1 capsule (2,000 Units total) by mouth daily. 100 capsule 3   ciclopirox (PENLAC) 8 % solution Apply topically at bedtime. Apply over nail and surrounding skin daily 6.6 mL 1   Cyanocobalamin (B-12) 1000 MCG SUBL Place 1,000 mcg under the tongue daily.     flecainide (TAMBOCOR) 50 MG tablet Take 1 tablet (50 mg total) by mouth 2 (two) times daily. 180 tablet 1   losartan (COZAAR) 25 MG tablet Take 1 tablet (25 mg total) by mouth daily. (Patient taking differently: Take 12.5-25 mg by mouth daily. Depending on Blood pressure, normally takes 1/2 tablet daily) 90 tablet 3   Multiple Vitamins-Minerals (MULTIVITAMIN WITH MINERALS) tablet Take 1 tablet by mouth daily.     sildenafil (VIAGRA) 100 MG tablet Take 1 tablet (100 mg total) by mouth daily as needed for erectile dysfunction. (Patient  taking differently: Take 50 mg by mouth daily as needed for erectile dysfunction.) 12 tablet 5   No current facility-administered medications for this encounter.    Allergies  Allergen Reactions   Procainamide Hcl Other (See Comments)     sun dermatitis   Quinidine Other (See Comments)    High fever    ROS- All systems are reviewed and negative except as per the HPI above  Physical Exam: Vitals:   05/21/23 0928  BP: 118/80  Pulse: (!) 113  Weight: 77.5 kg  Height: 5\' 8"  (1.727 m)     Wt Readings from Last 3 Encounters:  05/21/23 77.5 kg  02/14/23 80.6 kg  01/29/23 78 kg    Labs: Lab Results  Component Value Date   NA 140 01/29/2023   K 4.6 01/29/2023   CL 103 01/29/2023   CO2 24 12/12/2022   GLUCOSE 101 (H) 01/29/2023   BUN 15 01/29/2023   CREATININE 1.00 01/29/2023  CALCIUM 9.1 12/12/2022   Lab Results  Component Value Date   INR 0.95 02/12/2011   Lab Results  Component Value Date   CHOL 197 07/03/2022   HDL 41.40 07/03/2022   LDLCALC 130 (H) 07/03/2022   TRIG 132.0 07/03/2022    GEN- The patient is well appearing, alert and oriented x 3 today.   Neck - no JVD or carotid bruit noted Lungs- Clear to ausculation bilaterally, normal work of breathing Heart- Tachycardic regular rate and rhythm, no murmurs, rubs or gallops, PMI not laterally displaced Extremities- no clubbing, cyanosis, or edema Skin - no rash or ecchymosis noted  EKG today demonstrates Vent. rate 113 BPM PR interval 182 ms QRS duration 100 ms QT/QTcB 306/419 ms P-R-T axes 46 36 55 Sinus tachycardia - appears to be atrial flutter Nonspecific ST and T wave abnormality Abnormal ECG When compared with ECG of 14-Feb-2023 09:33, PREVIOUS ECG IS PRESENT  Echo 07/24/22  1. Left ventricular ejection fraction, by estimation, is 55 to 60%. The left ventricle has normal function. The left ventricle has no regional wall motion abnormalities. Diastolic function indeterminant due to Afib.    2. Right ventricular systolic function is normal. The right ventricular size is normal. There is normal pulmonary artery systolic pressure.   3. Left atrial size was moderately dilated.   4. Right atrial size was mildly dilated.   5. The mitral valve is grossly normal. Trivial mitral valve  regurgitation.   6. The aortic valve is tricuspid. There is mild calcification of the  aortic valve. There is mild thickening of the aortic valve. Aortic valve regurgitation is mild. Aortic valve sclerosis/calcification is present, without any evidence of aortic stenosis.   7. Aortic dilatation noted. There is mild dilatation of the aortic root, measuring 41 mm. There is mild dilatation of the ascending aorta, measuring 41 mm.   8. The inferior vena cava is normal in size with greater than 50%  respiratory variability, suggesting right atrial pressure of 3 mmHg.   Comparison(s): Compard to prior TEE report in 2017, there is no  significant change.    CHA2DS2-VASc Score = 4  The patient's score is based upon: CHF History: 0 HTN History: 1 Diabetes History: 0 Stroke History: 2 (TIA) Vascular Disease History: 0 Age Score: 1 Gender Score: 0       ASSESSMENT AND PLAN: 1. Persistent Atrial Fibrillation/atrial flutter The patient's CHA2DS2-VASc score is 4, indicating a 4.8% annual risk of stroke.   S/p ablation 1992 and 08/22/22 afib and flutter S/p DCCV 01/29/23  Patient is currently in atrial flutter. Will schedule DCCV 3 weeks from 7/5. Labs today. F/u 1-2 weeks with Ricky. He can likely go back to atenolol 25 mg BID after DCCV.   Informed Consent   Shared Decision Making/Informed Consent The risks (stroke, cardiac arrhythmias rarely resulting in the need for a temporary or permanent pacemaker, skin irritation or burns and complications associated with conscious sedation including aspiration, arrhythmia, respiratory failure and death), benefits (restoration of normal sinus rhythm) and alternatives  of a direct current cardioversion were explained in detail to Mr. Schetter and he agrees to proceed.      2. Secondary Hypercoagulable State (ICD10:  D68.69) The patient is at significant risk for stroke/thromboembolism based upon his CHA2DS2-VASc Score of 4.  Continue Apixaban (Eliquis).   3. HTN Stable, no changes today.   Follow up 1-2 weeks after DCCV.   Lake Bells, PA-C [facility] [address] [place],  [place] [address] [phone]

## 2023-05-21 NOTE — H&P (View-Only) (Signed)
Primary Care Physician: [name], MD Referring Physician: self referred Primary EP: [name] is a 72 y.o. male with a h/o afib, atrial flutter, HTN, HLD, TIA who presents for follow up in the [facility]. Patient had been maintained on flecainide for several years and had an ablation at [facility] in 1992. He underwent repeat ablation with Dr [name] for afib and atrial flutter 08/22/22. He was found to be in atypical atrial flutter 12/12/22 and underwent TEE/DCCV on 12/21/22. Unfortunately, he had quick return of his arrhythmia and flecainide was resumed with repeat DCCV on 01/29/23. He is on Eliquis for a CHADS2VASc score of 4.   On follow up today, patient reports that he has done well since the DCCV. He has a new Apple Watch which has shown only SR. He denies any bleeding issues on anticoagulation.   On follow up 05/21/23, he is currently in atrial flutter with RVR. Patient called on 6/27 noting he went back into Afib and took an extra 25 mg of atenolol and 50 mg of flecainide. He was advised to take atenolol 50 mg BID and no additional flecainide. He notes his HR at home will be 70-90s with current regimen. He does not feel especially bad but notes feeling tired with activity. He missed a dose of Eliquis last week - taking two full doses since 7/5.   Today, he denies symptoms of palpitations, chest pain, shortness of breath, orthopnea, PND, lower extremity edema, dizziness, presyncope, syncope, or neurologic sequela. The patient is tolerating medications without difficulties and is otherwise without complaint today.   Past Medical History:  Diagnosis Date   Anxiety    Cataract    Depression    Diverticulosis    HTN (hypertension)    Hyperlipidemia    Paroxysmal A-fib (HCC)    Prostate cancer [facility])    2010 [name]    [facility] spotted fever    age 49   S/P cardiac cath 02/2011   No obstructive disease. Normal LV function    Scoliosis    TIA (transient ischemic attack)    2012   Tubular adenoma of colon    Vitamin B 12 deficiency    Vitamin D deficiency    Weight gain    Past Surgical History:  Procedure Laterality Date   ATRIAL FIBRILLATION ABLATION N/A 08/22/2022   Procedure: ATRIAL FIBRILLATION ABLATION;  Surgeon: Maurice Small, MD;  Location: [facility] INVASIVE CV LAB;  Service: Cardiovascular;  Laterality: N/A;   CARDIOVERSION N/A 07/10/2016   Procedure: CARDIOVERSION;  Surgeon: Lewayne Bunting, MD;  Location: [facility] ENDOSCOPY;  Service: Cardiovascular;  Laterality: N/A;   CARDIOVERSION N/A 12/21/2022   Procedure: CARDIOVERSION;  Surgeon: Jodelle Red, MD;  Location: [facility] ENDOSCOPY;  Service: Cardiovascular;  Laterality: N/A;   CARDIOVERSION N/A 01/29/2023   Procedure: CARDIOVERSION;  Surgeon: Chrystie Nose, MD;  Location: [facility] ENDOSCOPY;  Service: Cardiovascular;  Laterality: N/A;   CATARACT EXTRACTION W/ INTRAOCULAR LENS  IMPLANT, BILATERAL     COLONOSCOPY  01-24-2005   TICS ONLY    COLONOSCOPY WITH PROPOFOL N/A 12/19/2021   Procedure: COLONOSCOPY WITH PROPOFOL;  Surgeon: Beverley Fiedler, MD;  Location: [facility] ENDOSCOPY;  Service: Gastroenterology;  Laterality: N/A;   ENDOSCOPIC MUCOSAL RESECTION N/A 12/19/2021   Procedure: ENDOSCOPIC MUCOSAL RESECTION;  Surgeon: Beverley Fiedler, MD;  Location: [facility] ENDOSCOPY;  Service: Gastroenterology;  Laterality: N/A;   HEMOSTASIS CLIP PLACEMENT  12/19/2021   Procedure:  HEMOSTASIS CLIP PLACEMENT;  Surgeon: Beverley Fiedler, MD;  Location: [facility] ENDOSCOPY;  Service: Gastroenterology;;   HERNIA REPAIR     HERNIA REPAIR  1995 and 2003   twice   NASAL SEPTUM SURGERY     POLYPECTOMY  12/19/2021   Procedure: POLYPECTOMY;  Surgeon: Beverley Fiedler, MD;  Location: [facility] ENDOSCOPY;  Service: Gastroenterology;;   PROSTATECTOMY  11/2008   Robotic    SUBMUCOSAL LIFTING INJECTION  12/19/2021   Procedure: SUBMUCOSAL LIFTING INJECTION;  Surgeon: Beverley Fiedler, MD;  Location: [facility] ENDOSCOPY;  Service:  Gastroenterology;;   SUBMUCOSAL TATTOO INJECTION  12/19/2021   Procedure: SUBMUCOSAL TATTOO INJECTION;  Surgeon: Beverley Fiedler, MD;  Location: [facility] ENDOSCOPY;  Service: Gastroenterology;;   TEE WITHOUT CARDIOVERSION N/A 07/10/2016   Procedure: TRANSESOPHAGEAL ECHOCARDIOGRAM (TEE);  Surgeon: Lewayne Bunting, MD;  Location: [facility] ENDOSCOPY;  Service: Cardiovascular;  Laterality: N/A;   TEE WITHOUT CARDIOVERSION N/A 12/21/2022   Procedure: TRANSESOPHAGEAL ECHOCARDIOGRAM (TEE);  Surgeon: Jodelle Red, MD;  Location: [facility] ENDOSCOPY;  Service: Cardiovascular;  Laterality: N/A;    Current Outpatient Medications  Medication Sig Dispense Refill   ALPRAZolam (XANAX) 0.5 MG tablet TAKE 1 TABLET TWICE A DAY  AS NEEDED FOR ANXIETY (Patient taking differently: Take 0.5 mg by mouth daily as needed for sleep (pt takes 1/2 as needed at bedtime).) 180 tablet 1   apixaban (ELIQUIS) 5 MG TABS tablet Take 1 tablet (5 mg total) by mouth 2 (two) times daily. 180 tablet 2   atenolol (TENORMIN) 25 MG tablet Take 1 tablet (25 mg total) by mouth 2 (two) times daily. 180 tablet 3   Cholecalciferol (VITAMIN D3) 50 MCG (2000 UT) capsule Take 1 capsule (2,000 Units total) by mouth daily. 100 capsule 3   ciclopirox (PENLAC) 8 % solution Apply topically at bedtime. Apply over nail and surrounding skin daily 6.6 mL 1   Cyanocobalamin (B-12) 1000 MCG SUBL Place 1,000 mcg under the tongue daily.     flecainide (TAMBOCOR) 50 MG tablet Take 1 tablet (50 mg total) by mouth 2 (two) times daily. 180 tablet 1   losartan (COZAAR) 25 MG tablet Take 1 tablet (25 mg total) by mouth daily. (Patient taking differently: Take 12.5-25 mg by mouth daily. Depending on Blood pressure, normally takes 1/2 tablet daily) 90 tablet 3   Multiple Vitamins-Minerals (MULTIVITAMIN WITH MINERALS) tablet Take 1 tablet by mouth daily.     sildenafil (VIAGRA) 100 MG tablet Take 1 tablet (100 mg total) by mouth daily as needed for erectile dysfunction. (Patient  taking differently: Take 50 mg by mouth daily as needed for erectile dysfunction.) 12 tablet 5   No current facility-administered medications for this encounter.    Allergies  Allergen Reactions   Procainamide Hcl Other (See Comments)     sun dermatitis   Quinidine Other (See Comments)    High fever    ROS- All systems are reviewed and negative except as per the HPI above  Physical Exam: Vitals:   05/21/23 0928  BP: 118/80  Pulse: (!) 113  Weight: 77.5 kg  Height: 5\' 8"  (1.727 m)     Wt Readings from Last 3 Encounters:  05/21/23 77.5 kg  02/14/23 80.6 kg  01/29/23 78 kg    Labs: Lab Results  Component Value Date   NA 140 01/29/2023   K 4.6 01/29/2023   CL 103 01/29/2023   CO2 24 12/12/2022   GLUCOSE 101 (H) 01/29/2023   BUN 15 01/29/2023   CREATININE 1.00 01/29/2023  CALCIUM 9.1 12/12/2022   Lab Results  Component Value Date   INR 0.95 02/12/2011   Lab Results  Component Value Date   CHOL 197 07/03/2022   HDL 41.40 07/03/2022   LDLCALC 130 (H) 07/03/2022   TRIG 132.0 07/03/2022    GEN- The patient is well appearing, alert and oriented x 3 today.   Neck - no JVD or carotid bruit noted Lungs- Clear to ausculation bilaterally, normal work of breathing Heart- Tachycardic regular rate and rhythm, no murmurs, rubs or gallops, PMI not laterally displaced Extremities- no clubbing, cyanosis, or edema Skin - no rash or ecchymosis noted  EKG today demonstrates Vent. rate 113 BPM PR interval 182 ms QRS duration 100 ms QT/QTcB 306/419 ms P-R-T axes 46 36 55 Sinus tachycardia - appears to be atrial flutter Nonspecific ST and T wave abnormality Abnormal ECG When compared with ECG of 14-Feb-2023 09:33, PREVIOUS ECG IS PRESENT  Echo 07/24/22  1. Left ventricular ejection fraction, by estimation, is 55 to 60%. The left ventricle has normal function. The left ventricle has no regional wall motion abnormalities. Diastolic function indeterminant due to Afib.    2. Right ventricular systolic function is normal. The right ventricular size is normal. There is normal pulmonary artery systolic pressure.   3. Left atrial size was moderately dilated.   4. Right atrial size was mildly dilated.   5. The mitral valve is grossly normal. Trivial mitral valve  regurgitation.   6. The aortic valve is tricuspid. There is mild calcification of the  aortic valve. There is mild thickening of the aortic valve. Aortic valve regurgitation is mild. Aortic valve sclerosis/calcification is present, without any evidence of aortic stenosis.   7. Aortic dilatation noted. There is mild dilatation of the aortic root, measuring 41 mm. There is mild dilatation of the ascending aorta, measuring 41 mm.   8. The inferior vena cava is normal in size with greater than 50%  respiratory variability, suggesting right atrial pressure of 3 mmHg.   Comparison(s): Compard to prior TEE report in 2017, there is no  significant change.    CHA2DS2-VASc Score = 4  The patient's score is based upon: CHF History: 0 HTN History: 1 Diabetes History: 0 Stroke History: 2 (TIA) Vascular Disease History: 0 Age Score: 1 Gender Score: 0       ASSESSMENT AND PLAN: 1. Persistent Atrial Fibrillation/atrial flutter The patient's CHA2DS2-VASc score is 4, indicating a 4.8% annual risk of stroke.   S/p ablation 1992 and 08/22/22 afib and flutter S/p DCCV 01/29/23  Patient is currently in atrial flutter. Will schedule DCCV 3 weeks from 7/5. Labs today. F/u 1-2 weeks with Ricky. He can likely go back to atenolol 25 mg BID after DCCV.   Informed Consent   Shared Decision Making/Informed Consent The risks (stroke, cardiac arrhythmias rarely resulting in the need for a temporary or permanent pacemaker, skin irritation or burns and complications associated with conscious sedation including aspiration, arrhythmia, respiratory failure and death), benefits (restoration of normal sinus rhythm) and alternatives  of a direct current cardioversion were explained in detail to Mr. Schetter and he agrees to proceed.      2. Secondary Hypercoagulable State (ICD10:  D68.69) The patient is at significant risk for stroke/thromboembolism based upon his CHA2DS2-VASc Score of 4.  Continue Apixaban (Eliquis).   3. HTN Stable, no changes today.   Follow up 1-2 weeks after DCCV.   Lake Bells, PA-C [facility] [address] [place],  [place] [address] [phone]

## 2023-05-21 NOTE — Patient Instructions (Signed)
Day of cardioversion return to dosing of atenolol   Cardioversion scheduled for: Monday, July 29th   - Arrive at the [facility] and go to admitting at 7am   - Do not eat or drink anything after midnight the night prior to your procedure.   - Take all your morning medication (except diabetic medications) with a sip of water prior to arrival.  - You will not be able to drive home after your procedure.    - Do NOT miss any doses of your blood thinner - if you should miss a dose please notify our office immediately.   - If you feel as if you go back into normal rhythm prior to scheduled cardioversion, please notify our office immediately.   If your procedure is canceled in the cardioversion suite you will be charged a cancellation fee.

## 2023-06-07 NOTE — Progress Notes (Signed)
  Left voice message on patient's home phone with pre-procedure instructions for Monday, July 29 and to arrive at [facility] at 6:45AM.  NPO after midnight Take morning medications w/sip of water: Take blood thinners and BP medication w/sip of water.  [place], [place], [place], etc. For one week.  Confirmed no breaks in taking blood thinner for three weeks Needs a ride home and have a responsible adult to stay w/him or her for 24 hours No lotion day of procedure No jewelry

## 2023-06-10 ENCOUNTER — Other Ambulatory Visit: Payer: Self-pay

## 2023-06-10 ENCOUNTER — Ambulatory Visit (HOSPITAL_COMMUNITY)
Admission: RE | Admit: 2023-06-10 | Discharge: 2023-06-10 | Disposition: A | Discharge status: Home / Self Care | Payer: 59 | Attending: Cardiovascular Disease | Admitting: Cardiovascular Disease

## 2023-06-10 ENCOUNTER — Ambulatory Visit (HOSPITAL_COMMUNITY): Payer: 59 | Admitting: Anesthesiology

## 2023-06-10 ENCOUNTER — Encounter (HOSPITAL_COMMUNITY): Payer: Self-pay | Admitting: Cardiovascular Disease

## 2023-06-10 ENCOUNTER — Encounter (HOSPITAL_COMMUNITY)
Admission: RE | Disposition: A | Discharge status: Home / Self Care | Payer: Self-pay | Source: Home / Self Care | Attending: Cardiovascular Disease

## 2023-06-10 ENCOUNTER — Ambulatory Visit (HOSPITAL_BASED_OUTPATIENT_CLINIC_OR_DEPARTMENT_OTHER): Payer: 59 | Admitting: Anesthesiology

## 2023-06-10 DIAGNOSIS — Z7901 Long term (current) use of anticoagulants: Secondary | ICD-10-CM | POA: Insufficient documentation

## 2023-06-10 DIAGNOSIS — I484 Atypical atrial flutter: Secondary | ICD-10-CM

## 2023-06-10 DIAGNOSIS — G459 Transient cerebral ischemic attack, unspecified: Secondary | ICD-10-CM | POA: Diagnosis not present

## 2023-06-10 DIAGNOSIS — I10 Essential (primary) hypertension: Secondary | ICD-10-CM

## 2023-06-10 DIAGNOSIS — D6869 Other thrombophilia: Secondary | ICD-10-CM | POA: Insufficient documentation

## 2023-06-10 DIAGNOSIS — I4819 Other persistent atrial fibrillation: Secondary | ICD-10-CM | POA: Diagnosis not present

## 2023-06-10 DIAGNOSIS — I480 Paroxysmal atrial fibrillation: Secondary | ICD-10-CM

## 2023-06-10 DIAGNOSIS — I4892 Unspecified atrial flutter: Secondary | ICD-10-CM

## 2023-06-10 HISTORY — PX: CARDIOVERSION: SHX1299

## 2023-06-10 SURGERY — CARDIOVERSION
Anesthesia: General

## 2023-06-10 MED ORDER — LIDOCAINE 2% (20 MG/ML) 5 ML SYRINGE
INTRAMUSCULAR | Status: DC | PRN
  Administered 2023-06-10: 40 mg via INTRAVENOUS

## 2023-06-10 MED ORDER — SODIUM CHLORIDE 0.9 % IV SOLN: INTRAVENOUS | Status: DC

## 2023-06-10 MED ORDER — PROPOFOL 10 MG/ML IV BOLUS
INTRAVENOUS | Status: DC | PRN
Start: 2023-06-10 — End: 2023-06-10
  Administered 2023-06-10: 60 mg via INTRAVENOUS

## 2023-06-10 SURGICAL SUPPLY — 1 items: ELECT DEFIB PAD ADLT CADENCE (PAD) ×1 IMPLANT

## 2023-06-10 NOTE — Anesthesia Preprocedure Evaluation (Addendum)
Anesthesia Evaluation  Patient identified by MRN, date of birth, ID band Patient awake    Reviewed: Allergy & Precautions, H&P , NPO status , Patient's Chart, lab work & pertinent test results  Airway Mallampati: II   Neck ROM: full    Dental   Pulmonary    breath sounds clear to auscultation       Cardiovascular hypertension, + dysrhythmias Atrial Fibrillation  Rhythm:irregular Rate:Normal     Neuro/Psych  PSYCHIATRIC DISORDERS Anxiety Depression    TIA   GI/Hepatic   Endo/Other    Renal/GU      Musculoskeletal   Abdominal   Peds  Hematology   Anesthesia Other Findings   Reproductive/Obstetrics                             Anesthesia Physical Anesthesia Plan  ASA: 3  Anesthesia Plan: General   Post-op Pain Management:    Induction: Intravenous  PONV Risk Score and Plan: 2 and Propofol infusion and Treatment may vary due to age or medical condition  Airway Management Planned: Mask  Additional Equipment:   Intra-op Plan:   Post-operative Plan:   Informed Consent: I have reviewed the patients History and Physical, chart, labs and discussed the procedure including the risks, benefits and alternatives for the proposed anesthesia with the patient or authorized representative who has indicated his/her understanding and acceptance.     Dental advisory given  Plan Discussed with: CRNA, Anesthesiologist and Surgeon  Anesthesia Plan Comments:        Anesthesia Quick Evaluation

## 2023-06-10 NOTE — Transfer of Care (Signed)
Immediate Anesthesia Transfer of Care Note  Patient: Evan Farmer  Procedure(s) Performed: CARDIOVERSION  Patient Location: Cath Lab  Anesthesia Type:General  Level of Consciousness: awake, alert , and patient cooperative  Airway & Oxygen Therapy: Patient Spontanous Breathing and Patient connected to nasal cannula oxygen  Post-op Assessment: Report given to RN and Post -op Vital signs reviewed and stable  Post vital signs: Reviewed and stable  Last Vitals:  Vitals Value Taken Time  BP    Temp 36.7 C 06/10/23 0808  Pulse    Resp    SpO2      Last Pain:  Vitals:   06/10/23 0808  TempSrc: Temporal  PainSc: 0-No pain         Complications: No notable events documented.

## 2023-06-10 NOTE — Op Note (Signed)
Procedure: Electrical Cardioversion Indications:  Atrial Flutter  Procedure Details:  Consent: Risks of procedure as well as the alternatives and risks of each were explained to the (patient/caregiver).  Consent for procedure obtained.  Time Out: Verified patient identification, verified procedure, site/side was marked, verified correct patient position, special equipment/implants available, medications/allergies/relevent history reviewed, required imaging and test results available.  Performed  Patient placed on cardiac monitor, pulse oximetry, supplemental oxygen as necessary.  Sedation given:  propofol IV, Dr. Chaney Malling Pacer pads placed anterior and posterior chest.  Cardioverted 1 time(s).  Cardioversion with synchronized biphasic 150J shock.  Evaluation: Findings: Post procedure EKG shows: NSR Complications: None Patient did tolerate procedure well.  Time Spent Directly with the Patient:  30 minutes   Evan Farmer 06/10/2023, 8:05 AM

## 2023-06-10 NOTE — Interval H&P Note (Signed)
History and Physical Interval Note:  06/10/2023 11:30 AM  Evan Farmer  has presented today for surgery, with the diagnosis of AFIB.  The various methods of treatment have been discussed with the patient and family. After consideration of risks, benefits and other options for treatment, the patient has consented to  Procedure(s): CARDIOVERSION (N/A) as a surgical intervention.  The patient's history has been reviewed, patient examined, no change in status, stable for surgery.  I have reviewed the patient's chart and labs.  Questions were answered to the patient's satisfaction.     Clela Hagadorn

## 2023-06-10 NOTE — Anesthesia Postprocedure Evaluation (Signed)
Anesthesia Post Note  Patient: Evan Farmer  Procedure(s) Performed: CARDIOVERSION     Patient location during evaluation: Cath Lab Anesthesia Type: General Level of consciousness: awake and alert Pain management: pain level controlled Vital Signs Assessment: post-procedure vital signs reviewed and stable Respiratory status: spontaneous breathing, nonlabored ventilation, respiratory function stable and patient connected to nasal cannula oxygen Cardiovascular status: blood pressure returned to baseline and stable Postop Assessment: no apparent nausea or vomiting Anesthetic complications: no   No notable events documented.  Last Vitals:  Vitals:   06/10/23 0830 06/10/23 0835  BP: 112/77 106/81  Pulse: 61 61  Resp: 13 15  Temp:    SpO2: 100% 98%    Last Pain:  Vitals:   06/10/23 0808  TempSrc: Temporal  PainSc: 0-No pain                 Kambry Takacs S

## 2023-06-11 ENCOUNTER — Encounter (HOSPITAL_COMMUNITY): Payer: Self-pay | Admitting: Cardiovascular Disease

## 2023-06-12 ENCOUNTER — Encounter (INDEPENDENT_AMBULATORY_CARE_PROVIDER_SITE_OTHER): Payer: Self-pay

## 2023-06-17 ENCOUNTER — Other Ambulatory Visit: Payer: Self-pay | Admitting: Cardiovascular Disease

## 2023-06-18 ENCOUNTER — Ambulatory Visit (HOSPITAL_COMMUNITY): Payer: 59 | Admitting: Physician Assistant

## 2023-07-02 ENCOUNTER — Encounter: Payer: Self-pay | Admitting: Internal Medicine

## 2023-07-02 ENCOUNTER — Ambulatory Visit: Payer: 59 | Admitting: Internal Medicine

## 2023-07-02 VITALS — BP 104/62 | HR 53 | Temp 98.0°F | Ht 69.0 in | Wt 172.0 lb

## 2023-07-02 DIAGNOSIS — E785 Hyperlipidemia, unspecified: Secondary | ICD-10-CM | POA: Diagnosis not present

## 2023-07-02 DIAGNOSIS — E538 Deficiency of other specified B group vitamins: Secondary | ICD-10-CM

## 2023-07-02 DIAGNOSIS — G459 Transient cerebral ischemic attack, unspecified: Secondary | ICD-10-CM

## 2023-07-02 DIAGNOSIS — D6869 Other thrombophilia: Secondary | ICD-10-CM

## 2023-07-02 DIAGNOSIS — I10 Essential (primary) hypertension: Secondary | ICD-10-CM

## 2023-07-02 DIAGNOSIS — Z8673 Personal history of transient ischemic attack (TIA), and cerebral infarction without residual deficits: Secondary | ICD-10-CM

## 2023-07-02 DIAGNOSIS — E559 Vitamin D deficiency, unspecified: Secondary | ICD-10-CM | POA: Diagnosis not present

## 2023-07-02 DIAGNOSIS — I483 Typical atrial flutter: Secondary | ICD-10-CM

## 2023-07-02 DIAGNOSIS — I480 Paroxysmal atrial fibrillation: Secondary | ICD-10-CM

## 2023-07-02 DIAGNOSIS — Z7184 Encounter for health counseling related to travel: Secondary | ICD-10-CM

## 2023-07-02 DIAGNOSIS — F411 Generalized anxiety disorder: Secondary | ICD-10-CM

## 2023-07-02 DIAGNOSIS — I4819 Other persistent atrial fibrillation: Secondary | ICD-10-CM

## 2023-07-02 MED ORDER — ALPRAZOLAM 0.5 MG PO TABS: ORAL_TABLET | ORAL | 1 refills | Status: DC

## 2023-07-02 MED ORDER — PREDNISONE 10 MG PO TABS: ORAL_TABLET | ORAL | 0 refills | Status: DC

## 2023-07-02 NOTE — Assessment & Plan Note (Signed)
On Eliquis

## 2023-07-02 NOTE — Assessment & Plan Note (Signed)
No relapse 

## 2023-07-02 NOTE — Assessment & Plan Note (Signed)
Cont on Eliquis, Flecainide  and Atenolol S/p cardioversion

## 2023-07-02 NOTE — Progress Notes (Signed)
Subjective:  Patient ID: Evan Farmer, male    DOB: 09-01-1951  Age: 72 y.o. MRN: [id number]  CC: Follow-up (6 [facility] f/u)   HPI DAREAN LIMAS presents for anxiety, A fib, traveling to [place] for 1 month  Outpatient Medications Prior to Visit  Medication Sig Dispense Refill   apixaban (ELIQUIS) 5 MG TABS tablet Take 1 tablet (5 mg total) by mouth 2 (two) times daily. 180 tablet 2   atenolol (TENORMIN) 25 MG tablet Take 1 tablet (25 mg total) by mouth 2 (two) times daily. May take an extra tablet twice a day for breakthrough afib (Patient taking differently: Take 50 mg by mouth 2 (two) times daily.) 270 tablet 3   Cholecalciferol (VITAMIN D3) 50 MCG (2000 UT) capsule Take 1 capsule (2,000 Units total) by mouth daily. 100 capsule 3   ciclopirox (PENLAC) 8 % solution Apply topically at bedtime. Apply over nail and surrounding skin daily 6.6 mL 1   Cyanocobalamin (B-12) 1000 MCG SUBL Place 1,000 mcg under the tongue daily.     flecainide (TAMBOCOR) 50 MG tablet TAKE 1 TABLET(50 MG) BY MOUTH TWICE DAILY 180 tablet 1   losartan (COZAAR) 25 MG tablet Take 1 tablet (25 mg total) by mouth daily. (Patient taking differently: Take 12.5-25 mg by mouth daily as needed (Depending on Blood pressure, normally takes 1/2 tablet daily).) 90 tablet 3   Multiple Vitamins-Minerals (MULTIVITAMIN WITH MINERALS) tablet Take 1 tablet by mouth daily.     sildenafil (VIAGRA) 100 MG tablet Take 1 tablet (100 mg total) by mouth daily as needed for erectile dysfunction. (Patient taking differently: Take 50 mg by mouth daily as needed for erectile dysfunction.) 12 tablet 5   ALPRAZolam (XANAX) 0.5 MG tablet TAKE 1 TABLET TWICE A DAY  AS NEEDED FOR ANXIETY (Patient taking differently: Take 0.25 mg by mouth at bedtime as needed for sleep.) 180 tablet 1   No facility-administered medications prior to visit.    ROS: Review of Systems  Constitutional:  Negative for appetite change, fatigue and unexpected weight change.   HENT:  Negative for congestion, nosebleeds, sneezing, sore throat and trouble swallowing.   Eyes:  Negative for itching and visual disturbance.  Respiratory:  Negative for cough.   Cardiovascular:  Negative for chest pain, palpitations and leg swelling.  Gastrointestinal:  Negative for abdominal distention, blood in stool, diarrhea and nausea.  Genitourinary:  Negative for frequency and hematuria.  Musculoskeletal:  Negative for back pain, gait problem, joint swelling and neck pain.  Skin:  Negative for rash.  Neurological:  Negative for dizziness, tremors, speech difficulty and weakness.  Psychiatric/Behavioral:  Negative for agitation, dysphoric mood and sleep disturbance. The patient is not nervous/anxious.     Objective:  BP 104/62 (BP Location: Left Arm, Patient Position: Sitting, Cuff Size: Large)   Pulse (!) 53   Temp 98 F (36.7 C) (Oral)   Ht 5\' 9"  (1.753 m)   Wt 172 lb (78 kg)   SpO2 96%   BMI 25.40 kg/m   BP Readings from Last 3 Encounters:  07/02/23 104/62  06/10/23 106/81  05/21/23 118/80    Wt Readings from Last 3 Encounters:  07/02/23 172 lb (78 kg)  06/10/23 168 lb (76.2 kg)  05/21/23 170 lb 12.8 oz (77.5 kg)    Physical Exam Constitutional:      General: He is not in acute distress.    Appearance: He is well-developed.     Comments: NAD  Eyes:  Conjunctiva/sclera: Conjunctivae normal.     Pupils: Pupils are equal, round, and reactive to light.  Neck:     Thyroid: No thyromegaly.     Vascular: No JVD.  Cardiovascular:     Rate and Rhythm: Normal rate and regular rhythm.     Heart sounds: Normal heart sounds. No murmur heard.    No friction rub. No gallop.  Pulmonary:     Effort: Pulmonary effort is normal. No respiratory distress.     Breath sounds: Normal breath sounds. No wheezing or rales.  Chest:     Chest wall: No tenderness.  Abdominal:     General: Bowel sounds are normal. There is no distension.     Palpations: Abdomen is soft.  There is no mass.     Tenderness: There is no abdominal tenderness. There is no guarding or rebound.  Musculoskeletal:        General: No tenderness. Normal range of motion.     Cervical back: Normal range of motion.  Lymphadenopathy:     Cervical: No cervical adenopathy.  Skin:    General: Skin is warm and dry.     Findings: No rash.  Neurological:     Mental Status: He is alert and oriented to person, place, and time.     Cranial Nerves: No cranial nerve deficit.     Motor: No abnormal muscle tone.     Coordination: Coordination normal.     Gait: Gait normal.     Deep Tendon Reflexes: Reflexes are normal and symmetric.  Psychiatric:        Behavior: Behavior normal.        Thought Content: Thought content normal.        Judgment: Judgment normal.     Lab Results  Component Value Date   WBC 4.2 05/21/2023   HGB 17.0 05/21/2023   HCT 49.9 05/21/2023   PLT 147 (L) 05/21/2023   GLUCOSE 108 (H) 05/21/2023   CHOL 197 07/03/2022   TRIG 132.0 07/03/2022   HDL 41.40 07/03/2022   LDLCALC 130 (H) 07/03/2022   ALT 10 07/03/2022   AST 14 07/03/2022   NA 139 05/21/2023   K 4.7 05/21/2023   CL 101 05/21/2023   CREATININE 1.21 05/21/2023   BUN 23 05/21/2023   CO2 30 05/21/2023   TSH 1.47 01/02/2022   PSA <0.1 06/20/2020   INR 0.95 02/12/2011   HGBA1C  01/20/2011    5.2 (NOTE)                                                                       According to the ADA Clinical Practice Recommendations for 2011, when HbA1c is used as a screening test:   >=6.5%   Diagnostic of Diabetes Mellitus           (if abnormal result  is confirmed)  5.7-6.4%   Increased risk of developing Diabetes Mellitus  References:Diagnosis and Classification of Diabetes Mellitus,Diabetes Care,2011,34(Suppl 1):S62-S69 and Standards of Medical Care in         Diabetes - 2011,Diabetes Care,2011,34  (Suppl 1):S11-S61.    EP STUDY  Result Date: 06/10/2023 See surgical note for result.   Assessment &  Plan:   Problem List Items Addressed This  Visit     B12 deficiency - Primary    On B12      Vitamin D deficiency    Chronic  Vit D      Dyslipidemia    Not on Rx 4/21 CT IMPRESSION: Coronary calcium score of 0.825. This was 20th percentile for age and sex matched control.   Minimal calcification noted in the proximal LAD.   Minimal calcification in the aortic root and descending aorta.         Anxiety state    Chronic  Zoloft, prn Xanax  Potential benefits of a long term benzodiazepines  use as well as potential risks  and complications were explained to the patient and were aknowledged.      Relevant Medications   ALPRAZolam (XANAX) 0.5 MG tablet   Atrial fibrillation (HCC)    Cont on Eliquis, Flecainide  and Atenolol S/p cardioversion       HTN (hypertension)    Cont w/Atenolol, Losartan      TIA (transient ischemic attack)    No relapse      Atrial flutter (HCC)    In NSR   Cont on Eliquis, Flecainide  and Atenolol S/p cardioversion      Hypercoagulable state due to persistent atrial fibrillation (HCC)    On Eliquis      Travel advice encounter    Prednisone Rx for high altitude (2 miles)         Meds ordered this encounter  Medications   DISCONTD: ALPRAZolam (XANAX) 0.5 MG tablet    Sig: TAKE 1 TABLET TWICE A DAY  AS NEEDED FOR ANXIETY Strength: 0.5 mg    Dispense:  180 tablet    Refill:  1   DISCONTD: ALPRAZolam (XANAX) 0.5 MG tablet    Sig: TAKE 1 TABLET TWICE A DAY  AS NEEDED FOR ANXIETY Strength: 0.5 mg    Dispense:  180 tablet    Refill:  1   predniSONE (DELTASONE) 10 MG tablet    Sig: Prednisone 10 mg: take 2 tabs a day x 1 days, then 1 tab a day x 2 days, then stop. Take pc.    Dispense:  4 tablet    Refill:  0   ALPRAZolam (XANAX) 0.5 MG tablet    Sig: TAKE 1 TABLET TWICE A DAY  AS NEEDED FOR ANXIETY Strength: 0.5 mg    Dispense:  180 tablet    Refill:  1      Follow-up: Return in about 6 months (around 01/02/2024)  for Wellness Exam.  Sonda Primes, MD

## 2023-07-02 NOTE — Assessment & Plan Note (Signed)
Chronic  Zoloft, prn Xanax  Potential benefits of a long term benzodiazepines  use as well as potential risks  and complications were explained to the patient and were aknowledged.

## 2023-07-02 NOTE — Assessment & Plan Note (Signed)
Cont w/Atenolol, Losartan

## 2023-07-02 NOTE — Assessment & Plan Note (Signed)
Chronic  Vit D 

## 2023-07-02 NOTE — Assessment & Plan Note (Addendum)
In NSR   Cont on Eliquis, Flecainide  and Atenolol S/p cardioversion

## 2023-07-02 NOTE — Assessment & Plan Note (Signed)
Prednisone Rx for high altitude (2 miles)

## 2023-07-02 NOTE — Assessment & Plan Note (Signed)
On B12 

## 2023-07-02 NOTE — Assessment & Plan Note (Signed)
Not on Rx 4/21 CT IMPRESSION: Coronary calcium score of 0.825. This was 20th percentile for age and sex matched control.  Minimal calcification noted in the proximal LAD.  Minimal calcification in the aortic root and descending aorta.

## 2023-07-20 ENCOUNTER — Ambulatory Visit (HOSPITAL_COMMUNITY)
Admission: EM | Admit: 2023-07-20 | Discharge: 2023-07-20 | Disposition: A | Discharge status: Home / Self Care | Payer: 59 | Attending: Physician Assistant | Admitting: Physician Assistant

## 2023-07-20 ENCOUNTER — Other Ambulatory Visit: Payer: Self-pay

## 2023-07-20 ENCOUNTER — Encounter (HOSPITAL_COMMUNITY): Payer: Self-pay | Admitting: Emergency Medicine

## 2023-07-20 DIAGNOSIS — H00011 Hordeolum externum right upper eyelid: Secondary | ICD-10-CM

## 2023-07-20 DIAGNOSIS — H02843 Edema of right eye, unspecified eyelid: Secondary | ICD-10-CM | POA: Diagnosis not present

## 2023-07-20 MED ORDER — ERYTHROMYCIN 5 MG/GM OP OINT: TOPICAL_OINTMENT | OPHTHALMIC | 0 refills | Status: DC

## 2023-07-20 NOTE — ED Triage Notes (Signed)
Symptoms started 3 days ago.  Patient has been in [place] traveling.  Right eye is red, itchy, watery and this morning was swollen  Has not used any eye drops.  Used cold compress today

## 2023-07-20 NOTE — Discharge Instructions (Signed)
I believe that you have a stye.  Continue warm compresses regularly.  Use erythromycin ointment twice daily for 1 week.  Do not touch tip of medication bottle to the eye and make sure to wash your hands prior to daily medication to prevent contamination of the medicine.  If your symptoms or not improving quickly please follow-up with [facility].  If you have any worsening symptoms including fever, pain when you move your eyes, nausea, vomiting, vision change, foreign body sensation in the eye you need to be seen immediately.  Your blood pressure is elevated.  Will continue monitoring this at home.  If you develop any chest pain, shortness of breath, headache, vision change, dizziness in the setting of high blood pressure you need to be seen immediately.

## 2023-07-20 NOTE — ED Provider Notes (Signed)
[facility]    CSN: [id number] Arrival date & time: 07/20/23  1153      History   Chief Complaint Chief Complaint  Patient presents with   Eye Problem    HPI Evan Farmer is a 72 y.o. male.   Patient presents today with a 3-day history of right eyelid swelling.  He reports that this area has been itchy, tender, red but then this morning was much more swollen prompting evaluation.  He denies any visual disturbance, photophobia, fever, nausea, vomiting, pain with extraocular movement.  He did have cataract surgery on this side but otherwise denies any history of glaucoma or other ocular history.  He has not tried any over-the-counter medications for symptom management.  Denies any ocular trauma, foreign body sensation.  He has not seen an ophthalmologist in the past several years.  He does not wear glasses or contacts other than occasional reading glasses.  Denies any recent illness.    Past Medical History:  Diagnosis Date   Anxiety    Cataract    Depression    Diverticulosis    HTN (hypertension)    Hyperlipidemia    Paroxysmal A-fib (HCC)    Prostate cancer [facility])    2010 [name]    [facility] spotted fever    age 75   S/P cardiac cath 02/2011   No obstructive disease. Normal LV function   Scoliosis    TIA (transient ischemic attack)    2012   Tubular adenoma of colon    Vitamin B 12 deficiency    Vitamin D deficiency    Weight gain     Patient Active Problem List   Diagnosis Date Noted   Travel advice encounter 07/02/2023   Hypercoagulable state due to persistent atrial fibrillation (HCC) 02/14/2023   Colon polyps 01/01/2023   Atrial flutter (HCC) 12/21/2022   Onychomycosis 07/03/2022   History of colonic polyps    Benign neoplasm of ascending colon    Benign neoplasm of sigmoid colon    Forearm pain 12/22/2019   Well adult exam 01/19/2014   TIA (transient ischemic attack) 02/02/2011   HTN (hypertension)    Paroxysmal A-fib (HCC)     Weight gain    Vitamin D deficiency 03/30/2010   Anxiety state 02/13/2010   PALPITATIONS 02/10/2010   BACTERIAL PNEUMONIA 01/14/2010   B12 deficiency 08/19/2009   HOARSENESS 08/15/2009   CBC, ABNORMAL 08/15/2009   PROSTATE CANCER, HX OF 08/15/2009   DIARRHEA OF PRESUMED INFECTIOUS ORIGIN 07/10/2008   RUPTURE, QUADRICEPS TENDON 03/02/2008   Dyslipidemia 10/24/2007   DEPRESSION 10/24/2007   Atrial fibrillation (HCC) 10/24/2007   PROSTATITIS, RECURRENT 10/24/2007   ERECTILE DYSFUNCTION 10/24/2007   ELEVATED PROSTATE SPECIFIC ANTIGEN 10/24/2007    Past Surgical History:  Procedure Laterality Date   ATRIAL FIBRILLATION ABLATION N/A 08/22/2022   Procedure: ATRIAL FIBRILLATION ABLATION;  Surgeon: Maurice Small, MD;  Location: [facility] INVASIVE [facility];  Service: Cardiovascular;  Laterality: N/A;   CARDIOVERSION N/A 07/10/2016   Procedure: CARDIOVERSION;  Surgeon: Lewayne Bunting, MD;  Location: [facility] ENDOSCOPY;  Service: Cardiovascular;  Laterality: N/A;   CARDIOVERSION N/A 12/21/2022   Procedure: CARDIOVERSION;  Surgeon: Jodelle Red, MD;  Location: [facility] ENDOSCOPY;  Service: Cardiovascular;  Laterality: N/A;   CARDIOVERSION N/A 01/29/2023   Procedure: CARDIOVERSION;  Surgeon: Chrystie Nose, MD;  Location: [facility] ENDOSCOPY;  Service: Cardiovascular;  Laterality: N/A;   CARDIOVERSION N/A 06/10/2023   Procedure: CARDIOVERSION;  Surgeon: Thurmon Fair, MD;  Location: [facility]  INVASIVE CV LAB;  Service: Cardiovascular;  Laterality: N/A;   CATARACT EXTRACTION W/ INTRAOCULAR LENS  IMPLANT, BILATERAL     COLONOSCOPY  01-24-2005   TICS ONLY    COLONOSCOPY WITH PROPOFOL N/A 12/19/2021   Procedure: COLONOSCOPY WITH PROPOFOL;  Surgeon: Beverley Fiedler, MD;  Location: [facility] ENDOSCOPY;  Service: Gastroenterology;  Laterality: N/A;   ENDOSCOPIC MUCOSAL RESECTION N/A 12/19/2021   Procedure: ENDOSCOPIC MUCOSAL RESECTION;  Surgeon: Beverley Fiedler, MD;  Location: [facility] ENDOSCOPY;  Service: Gastroenterology;  Laterality:  N/A;   HEMOSTASIS CLIP PLACEMENT  12/19/2021   Procedure: HEMOSTASIS CLIP PLACEMENT;  Surgeon: Beverley Fiedler, MD;  Location: [facility] ENDOSCOPY;  Service: Gastroenterology;;   HERNIA REPAIR     HERNIA REPAIR  1995 and 2003   twice   NASAL SEPTUM SURGERY     POLYPECTOMY  12/19/2021   Procedure: POLYPECTOMY;  Surgeon: Beverley Fiedler, MD;  Location: [facility] ENDOSCOPY;  Service: Gastroenterology;;   PROSTATECTOMY  11/2008   Robotic    SUBMUCOSAL LIFTING INJECTION  12/19/2021   Procedure: SUBMUCOSAL LIFTING INJECTION;  Surgeon: Beverley Fiedler, MD;  Location: [facility] ENDOSCOPY;  Service: Gastroenterology;;   SUBMUCOSAL TATTOO INJECTION  12/19/2021   Procedure: SUBMUCOSAL TATTOO INJECTION;  Surgeon: Beverley Fiedler, MD;  Location: [facility] ENDOSCOPY;  Service: Gastroenterology;;   TEE WITHOUT CARDIOVERSION N/A 07/10/2016   Procedure: TRANSESOPHAGEAL ECHOCARDIOGRAM (TEE);  Surgeon: Lewayne Bunting, MD;  Location: [facility] ENDOSCOPY;  Service: Cardiovascular;  Laterality: N/A;   TEE WITHOUT CARDIOVERSION N/A 12/21/2022   Procedure: TRANSESOPHAGEAL ECHOCARDIOGRAM (TEE);  Surgeon: Jodelle Red, MD;  Location: [facility] ENDOSCOPY;  Service: Cardiovascular;  Laterality: N/A;       Home Medications    Prior to Admission medications   Medication Sig Start Date End Date Taking? Authorizing Provider  erythromycin ophthalmic ointment Place a 1/2 inch ribbon of ointment into the lower eyelid of right eye twice daily for 7 days. 07/20/23  Yes Oluwasemilore Bahl K, PA-C  ALPRAZolam (XANAX) 0.5 MG tablet TAKE 1 TABLET TWICE A DAY  AS NEEDED FOR ANXIETY Strength: 0.5 mg 07/02/23   Plotnikov, Georgina Quint, MD  apixaban (ELIQUIS) 5 MG TABS tablet Take 1 tablet (5 mg total) by mouth 2 (two) times daily. 02/14/23   Fenton, Clint R, PA  atenolol (TENORMIN) 25 MG tablet Take 1 tablet (25 mg total) by mouth 2 (two) times daily. May take an extra tablet twice a day for breakthrough afib Patient taking differently: Take 50 mg by mouth 2 (two) times daily. 05/21/23 05/15/24   Eustace Pen, PA-C  Cholecalciferol (VITAMIN D3) 50 MCG (2000 UT) capsule Take 1 capsule (2,000 Units total) by mouth daily. 12/20/20   Plotnikov, Georgina Quint, MD  ciclopirox (PENLAC) 8 % solution Apply topically at bedtime. Apply over nail and surrounding skin daily 07/03/22   Plotnikov, Georgina Quint, MD  Cyanocobalamin (B-12) 1000 MCG SUBL Place 1,000 mcg under the tongue daily.    [provider]  flecainide (TAMBOCOR) 50 MG tablet TAKE 1 TABLET(50 MG) BY MOUTH TWICE DAILY 06/18/23   Mealor, Roberts Gaudy, MD  losartan (COZAAR) 25 MG tablet Take 1 tablet (25 mg total) by mouth daily. Patient taking differently: Take 12.5-25 mg by mouth daily as needed (Depending on Blood pressure, normally takes 1/2 tablet daily). 01/15/23   Mealor, Roberts Gaudy, MD  Multiple Vitamins-Minerals (MULTIVITAMIN WITH MINERALS) tablet Take 1 tablet by mouth daily.    [provider]  predniSONE (DELTASONE) 10 MG tablet Prednisone 10 mg: take 2 tabs  a day x 1 days, then 1 tab a day x 2 days, then stop. Take pc. Patient not taking: Reported on 07/20/2023 07/02/23   Plotnikov, Georgina Quint, MD  sildenafil (VIAGRA) 100 MG tablet Take 1 tablet (100 mg total) by mouth daily as needed for erectile dysfunction. Patient taking differently: Take 50 mg by mouth daily as needed for erectile dysfunction. 12/20/20   Plotnikov, Georgina Quint, MD    Family History Family History  Problem Relation Age of Onset   Arrhythmia Mother    Atrial fibrillation Mother    Cancer Father    Lung cancer Other    Prostate cancer Other    Colon cancer Paternal Uncle    Rectal cancer Neg Hx    Stomach cancer Neg Hx     Social History Social History   Tobacco Use   Smoking status: Never   Smokeless tobacco: Never   Tobacco comments:    Never smoke 02/14/23  Vaping Use   Vaping status: Never Used  Substance Use Topics   Alcohol use: Yes    Alcohol/week: 8.0 standard drinks of alcohol    Types: 4 Glasses of wine, 4 Shots of liquor per  week    Comment: Social alcohol use   Drug use: No     Allergies   Procainamide hcl and Quinidine   Review of Systems Review of Systems  Constitutional:  Positive for activity change. Negative for appetite change, fatigue and fever.  Eyes:  Positive for itching. Negative for photophobia, pain, discharge, redness and visual disturbance.  Respiratory:  Negative for cough and shortness of breath.   Cardiovascular:  Negative for chest pain.  Gastrointestinal:  Negative for abdominal pain, diarrhea, nausea and vomiting.  Neurological:  Negative for dizziness, light-headedness and headaches.     Physical Exam Triage Vital Signs ED Triage Vitals  Encounter Vitals Group     BP 07/20/23 1303 (!) 172/86     Systolic BP Percentile --      Diastolic BP Percentile --      Pulse Rate 07/20/23 1303 (!) 50     Resp 07/20/23 1303 18     Temp 07/20/23 1303 97.8 F (36.6 C)     Temp Source 07/20/23 1303 Oral     SpO2 07/20/23 1303 95 %     Weight --      Height --      Head Circumference --      Peak Flow --      Pain Score 07/20/23 1301 0     Pain Loc --      Pain Education --      Exclude from Growth Chart --    No data found.  Updated Vital Signs BP (!) 173/79 (BP Location: Right Arm) Comment: repositioned, arm at heart level  Pulse (!) 50   Temp 97.8 F (36.6 C) (Oral)   Resp 18   SpO2 95%   Visual Acuity Right Eye Distance: 20/40 Left Eye Distance: 20/50 Bilateral Distance:    Right Eye Near:   Left Eye Near:    Bilateral Near:     Physical Exam Vitals reviewed.  Constitutional:      General: He is awake.     Appearance: Normal appearance. He is well-developed. He is not ill-appearing.     Comments: Very pleasant male appears stated age in no acute distress sitting comfortably in exam room  HENT:     Head: Normocephalic and atraumatic.     Mouth/Throat:  Pharynx: Uvula midline. No oropharyngeal exudate or posterior oropharyngeal erythema.  Eyes:      General:        Right eye: Hordeolum present.        Left eye: No hordeolum.     Extraocular Movements: Extraocular movements intact.     Conjunctiva/sclera: Conjunctivae normal.     Right eye: Right conjunctiva is not injected. No chemosis.    Left eye: Left conjunctiva is not injected. No chemosis.    Pupils: Pupils are equal, round, and reactive to light.     Comments: Swelling and redness noted right upper eyelid.  Cardiovascular:     Rate and Rhythm: Normal rate and regular rhythm.     Heart sounds: Normal heart sounds, S1 normal and S2 normal. No murmur heard. Pulmonary:     Effort: Pulmonary effort is normal.     Breath sounds: Normal breath sounds. No stridor. No wheezing, rhonchi or rales.     Comments: Clear to auscultation bilaterally Neurological:     Mental Status: He is alert.  Psychiatric:        Behavior: Behavior is cooperative.      UC Treatments / Results  Labs (all labs ordered are listed, but only abnormal results are displayed) Labs Reviewed - No data to display  EKG   Radiology No results found.  Procedures Procedures (including critical care time)  Medications Ordered in UC Medications - No data to display  Initial Impression / Assessment and Plan / UC Course  I have reviewed the triage vital signs and the nursing notes.  Pertinent labs & imaging results that were available during my care of the patient were reviewed by me and considered in my medical decision making (see chart for details).     Patient is well-appearing, afebrile, nontoxic, nontachycardic.  No concerning symptoms of preseptal cellulitis or orbital cellulitis.  He has no foreign body sensation or discomfort/sensation in his eye as symptoms are localized to the eyelid.  Will treat for hordeolum.  He was encouraged to warm compresses regularly and use erythromycin ointment.  No indication for fluoroquinolone as he does not use contacts.  Fluorescein stain was deferred as he denies  any recent traumas no foreign body sensation.  We discussed that if his symptoms do not improving quickly he should follow-up with ophthalmology and was given contact information for local provider with instruction to call to schedule an appointment.  We discussed at length that if he has any worsening or changing symptoms including foreign body sensation, visual disturbance, spread of redness and erythema, pain with extraocular movements, photophobia, nausea, vomiting he needs to be seen immediately.  Strict return precautions given.  All questions answered to patient satisfaction.  Blood pressure is elevated today but he reports that this is because he is in a doctor's office as he often has whitecoat syndrome.  He took his blood pressure earlier today and it was normal.  He denies any chest pain, shortness of breath, headache, vision change, dizziness.  He does monitor his blood pressure regularly.  Discussed that if he develops any worsening symptoms he is return for reevaluation.  He is to follow closely with his primary care.  Final Clinical Impressions(s) / UC Diagnoses   Final diagnoses:  Hordeolum externum of right upper eyelid  Swelling of right eyelid     Discharge Instructions      I believe that you have a stye.  Continue warm compresses regularly.  Use erythromycin ointment twice daily  for 1 week.  Do not touch tip of medication bottle to the eye and make sure to wash your hands prior to daily medication to prevent contamination of the medicine.  If your symptoms or not improving quickly please follow-up with [facility].  If you have any worsening symptoms including fever, pain when you move your eyes, nausea, vomiting, vision change, foreign body sensation in the eye you need to be seen immediately.  Your blood pressure is elevated.  Will continue monitoring this at home.  If you develop any chest pain, shortness of breath, headache, vision change, dizziness in the setting of high  blood pressure you need to be seen immediately.     ED Prescriptions     Medication Sig Dispense Auth. Provider   erythromycin ophthalmic ointment Place a 1/2 inch ribbon of ointment into the lower eyelid of right eye twice daily for 7 days. 3.5 g Hodari Chuba K, PA-C      PDMP not reviewed this encounter.   Jeani Hawking, PA-C 07/20/23 1349

## 2023-08-16 ENCOUNTER — Encounter (HOSPITAL_COMMUNITY): Payer: Self-pay | Admitting: Physician Assistant

## 2023-08-16 ENCOUNTER — Ambulatory Visit (HOSPITAL_COMMUNITY)
Admission: RE | Admit: 2023-08-16 | Discharge: 2023-08-16 | Disposition: A | Discharge status: Home / Self Care | Payer: 59 | Source: Ambulatory Visit | Attending: Physician Assistant | Admitting: Physician Assistant

## 2023-08-16 VITALS — BP 128/70 | HR 56 | Ht 69.0 in | Wt 175.8 lb

## 2023-08-16 DIAGNOSIS — Z79899 Other long term (current) drug therapy: Secondary | ICD-10-CM | POA: Insufficient documentation

## 2023-08-16 DIAGNOSIS — I4892 Unspecified atrial flutter: Secondary | ICD-10-CM | POA: Diagnosis not present

## 2023-08-16 DIAGNOSIS — D6869 Other thrombophilia: Secondary | ICD-10-CM | POA: Diagnosis not present

## 2023-08-16 DIAGNOSIS — Z5181 Encounter for therapeutic drug level monitoring: Secondary | ICD-10-CM | POA: Diagnosis not present

## 2023-08-16 DIAGNOSIS — Z8673 Personal history of transient ischemic attack (TIA), and cerebral infarction without residual deficits: Secondary | ICD-10-CM | POA: Insufficient documentation

## 2023-08-16 DIAGNOSIS — I10 Essential (primary) hypertension: Secondary | ICD-10-CM | POA: Insufficient documentation

## 2023-08-16 DIAGNOSIS — Z7901 Long term (current) use of anticoagulants: Secondary | ICD-10-CM | POA: Insufficient documentation

## 2023-08-16 DIAGNOSIS — I4819 Other persistent atrial fibrillation: Secondary | ICD-10-CM | POA: Diagnosis present

## 2023-08-16 DIAGNOSIS — E785 Hyperlipidemia, unspecified: Secondary | ICD-10-CM | POA: Insufficient documentation

## 2023-08-16 MED ORDER — FLECAINIDE ACETATE 50 MG PO TABS: 100.0000 mg | ORAL_TABLET | Freq: Two times a day (BID) | ORAL | Status: DC

## 2023-08-16 NOTE — Progress Notes (Signed)
Primary Care Physician: [name], MD Referring Physician: self referred Primary EP: [name] is a 72 y.o. male with a h/o afib, atrial flutter, HTN, HLD, TIA who presents for follow up in the [facility]. Patient had been maintained on flecainide for several years and had an ablation at [facility] in 1992. He underwent repeat ablation with Dr [name] for afib and atrial flutter 08/22/22. He was found to be in atypical atrial flutter 12/12/22 and underwent TEE/DCCV on 12/21/22. Unfortunately, he had quick return of his arrhythmia and flecainide was resumed with repeat DCCV on 01/29/23. He is on Eliquis for a CHADS2VASc score of 4. He went into atrial flutter and underwent another DCCV on 06/10/23.  On follow up today, patient reports that he has done well since his last visit. He remains in SR. No bleeding issues on anticoagulation.   Today, he denies symptoms of palpitations, chest pain, shortness of breath, orthopnea, PND, lower extremity edema, dizziness, presyncope, syncope, or neurologic sequela. The patient is tolerating medications without difficulties and is otherwise without complaint today.   Past Medical History:  Diagnosis Date   Anxiety    Cataract    Depression    Diverticulosis    HTN (hypertension)    Hyperlipidemia    Paroxysmal A-fib (HCC)    Prostate cancer [facility])    2010 [name]    [facility] spotted fever    age 72   S/P cardiac cath 02/2011   No obstructive disease. Normal LV function   Scoliosis    TIA (transient ischemic attack)    2012   Tubular adenoma of colon    Vitamin B 12 deficiency    Vitamin D deficiency    Weight gain     Current Outpatient Medications  Medication Sig Dispense Refill   ALPRAZolam (XANAX) 0.5 MG tablet TAKE 1 TABLET TWICE A DAY  AS NEEDED FOR ANXIETY Strength: 0.5 mg 180 tablet 1   apixaban (ELIQUIS) 5 MG TABS tablet Take 1 tablet (5 mg total) by mouth 2 (two) times daily.  180 tablet 2   atenolol (TENORMIN) 25 MG tablet Take 1 tablet (25 mg total) by mouth 2 (two) times daily. May take an extra tablet twice a day for breakthrough afib (Patient taking differently: Take 50 mg by mouth 2 (two) times daily.) 270 tablet 3   Cholecalciferol (VITAMIN D3) 50 MCG (2000 UT) capsule Take 1 capsule (2,000 Units total) by mouth daily. 100 capsule 3   Cyanocobalamin (B-12) 1000 MCG SUBL Place 1,000 mcg under the tongue daily.     flecainide (TAMBOCOR) 50 MG tablet TAKE 1 TABLET(50 MG) BY MOUTH TWICE DAILY 180 tablet 1   losartan (COZAAR) 25 MG tablet Take 1 tablet (25 mg total) by mouth daily. (Patient taking differently: Take 12.5-25 mg by mouth daily as needed (Depending on Blood pressure, normally takes 1/2 tablet daily).) 90 tablet 3   Multiple Vitamins-Minerals (MULTIVITAMIN WITH MINERALS) tablet Take 1 tablet by mouth daily.     ciclopirox (PENLAC) 8 % solution Apply topically at bedtime. Apply over nail and surrounding skin daily 6.6 mL 1   erythromycin ophthalmic ointment Place a 1/2 inch ribbon of ointment into the lower eyelid of right eye twice daily for 7 days. 3.5 g 0   predniSONE (DELTASONE) 10 MG tablet Prednisone 10 mg: take 2 tabs a day x 1 days, then 1 tab a day x 2 days, then stop. Take  pc. (Patient not taking: Reported on 07/20/2023) 4 tablet 0   sildenafil (VIAGRA) 100 MG tablet Take 1 tablet (100 mg total) by mouth daily as needed for erectile dysfunction. (Patient taking differently: Take 50 mg by mouth daily as needed for erectile dysfunction.) 12 tablet 5   No current facility-administered medications for this encounter.    ROS- All systems are reviewed and negative except as per the HPI above  Physical Exam: Vitals:   08/16/23 0833  BP: 128/70  Pulse: (!) 56  Weight: 79.7 kg  Height: 5\' 9"  (1.753 m)     Wt Readings from Last 3 Encounters:  08/16/23 79.7 kg  07/02/23 78 kg  06/10/23 76.2 kg    GEN: Well nourished, well developed in no acute  distress NECK: No JVD; No carotid bruits CARDIAC: Regular rate and rhythm, no murmurs, rubs, gallops RESPIRATORY:  Clear to auscultation without rales, wheezing or rhonchi  ABDOMEN: Soft, non-tender, non-distended EXTREMITIES:  No edema; No deformity    EKG today demonstrates SB Vent. rate 56 BPM PR interval 178 ms QRS duration 98 ms QT/QTcB 420/405 ms  Echo 07/24/22  1. Left ventricular ejection fraction, by estimation, is 55 to 60%. The left ventricle has normal function. The left ventricle has no regional wall motion abnormalities. Diastolic function indeterminant due to Afib.   2. Right ventricular systolic function is normal. The right ventricular size is normal. There is normal pulmonary artery systolic pressure.   3. Left atrial size was moderately dilated.   4. Right atrial size was mildly dilated.   5. The mitral valve is grossly normal. Trivial mitral valve  regurgitation.   6. The aortic valve is tricuspid. There is mild calcification of the  aortic valve. There is mild thickening of the aortic valve. Aortic valve regurgitation is mild. Aortic valve sclerosis/calcification is present, without any evidence of aortic stenosis.   7. Aortic dilatation noted. There is mild dilatation of the aortic root, measuring 41 mm. There is mild dilatation of the ascending aorta, measuring 41 mm.   8. The inferior vena cava is normal in size with greater than 50%  respiratory variability, suggesting right atrial pressure of 3 mmHg.   Comparison(s): Compard to prior TEE report in 2017, there is no  significant change.    CHA2DS2-VASc Score = 4  The patient's score is based upon: CHF History: 0 HTN History: 1 Diabetes History: 0 Stroke History: 2 (TIA) Vascular Disease History: 0 Age Score: 1 Gender Score: 0       ASSESSMENT AND PLAN: Persistent Atrial Fibrillation/atrial flutter The patient's CHA2DS2-VASc score is 4, indicating a 4.8% annual risk of stroke.   S/p ablation 1992  and 08/22/22 afib and flutter Has had three cardioversions this year. We discussed rhythm control options. Will increase flecainide to 100 mg BID. He has tolerated this dose previously. Repeat ECG next week.  Continue Eliquis 5 mg BID Continue atenolol 25 mg BID  Secondary Hypercoagulable State (ICD10:  D68.69) The patient is at significant risk for stroke/thromboembolism based upon his CHA2DS2-VASc Score of 4.  Continue Apixaban (Eliquis).   HTN Stable on current regimen   Follow up in the AF clinic next week for ECG.    Jorja Loa PA-C Afib Clinic [facility] [address] [place], [place] [address] [phone]

## 2023-08-16 NOTE — Patient Instructions (Signed)
Increase flecainide to 100mg twice a day 

## 2023-08-21 ENCOUNTER — Other Ambulatory Visit (HOSPITAL_BASED_OUTPATIENT_CLINIC_OR_DEPARTMENT_OTHER): Payer: Self-pay

## 2023-08-23 ENCOUNTER — Ambulatory Visit (HOSPITAL_COMMUNITY)
Admission: RE | Admit: 2023-08-23 | Discharge: 2023-08-23 | Disposition: A | Discharge status: Home / Self Care | Payer: 59 | Source: Ambulatory Visit | Attending: Physician Assistant | Admitting: Physician Assistant

## 2023-08-23 VITALS — HR 54

## 2023-08-23 DIAGNOSIS — I440 Atrioventricular block, first degree: Secondary | ICD-10-CM | POA: Insufficient documentation

## 2023-08-23 DIAGNOSIS — R9431 Abnormal electrocardiogram [ECG] [EKG]: Secondary | ICD-10-CM | POA: Diagnosis not present

## 2023-08-23 DIAGNOSIS — Z79899 Other long term (current) drug therapy: Secondary | ICD-10-CM | POA: Insufficient documentation

## 2023-08-23 DIAGNOSIS — I4819 Other persistent atrial fibrillation: Secondary | ICD-10-CM | POA: Diagnosis present

## 2023-08-23 DIAGNOSIS — Z5181 Encounter for therapeutic drug level monitoring: Secondary | ICD-10-CM

## 2023-08-23 MED ORDER — FLECAINIDE ACETATE 100 MG PO TABS: 100.0000 mg | ORAL_TABLET | Freq: Two times a day (BID) | ORAL | 2 refills | Status: DC

## 2023-08-23 NOTE — Progress Notes (Signed)
Patient returns for ECG after increasing flecainide. ECG shows:  SR, 1st degree AV block Vent. rate 54 BPM PR interval 210 ms QRS duration 114 ms QT/QTcB 436/413 ms  PR interval has prolonged but not significantly. Patient tolerating the medication without difficulty. Follow up in the AF clinic in 6 months.

## 2023-09-02 ENCOUNTER — Other Ambulatory Visit: Payer: Self-pay | Admitting: Cardiovascular Disease

## 2023-09-02 ENCOUNTER — Telehealth (HOSPITAL_COMMUNITY): Payer: Self-pay | Admitting: *Deleted

## 2023-09-02 DIAGNOSIS — I10 Essential (primary) hypertension: Secondary | ICD-10-CM

## 2023-09-02 NOTE — Telephone Encounter (Signed)
Pt called in stating he went back into afib on Saturday evening. He has since increased his atenolol dose as he has previously done when in afib. HRs are currently in the 80s.  He is going out of town for a few days - he will call upon return if still in Afib for further management. Pt in agreement.

## 2023-09-06 NOTE — Telephone Encounter (Signed)
Patient states he is still in A-fib. He is feeling pretty good. HR 85 and B/P ranging from 126/142 systolic pressure and diastolic 80 range. Scheduled patient appointment to discuss his A-fib on 11/4 @ 8:30 am with Clint Fenton-PA. Communicated with patient and he verbalized understanding.

## 2023-09-11 ENCOUNTER — Other Ambulatory Visit (HOSPITAL_COMMUNITY): Payer: Self-pay | Admitting: *Deleted

## 2023-09-11 MED ORDER — FLECAINIDE ACETATE 100 MG PO TABS: 100.0000 mg | ORAL_TABLET | Freq: Two times a day (BID) | ORAL | 2 refills | Status: DC

## 2023-09-16 ENCOUNTER — Encounter (HOSPITAL_COMMUNITY): Payer: Self-pay | Admitting: Internal Medicine

## 2023-09-16 ENCOUNTER — Ambulatory Visit (HOSPITAL_COMMUNITY)
Admission: RE | Admit: 2023-09-16 | Discharge: 2023-09-16 | Disposition: A | Discharge status: Home / Self Care | Payer: 59 | Source: Ambulatory Visit | Attending: Physician Assistant | Admitting: Physician Assistant

## 2023-09-16 VITALS — BP 116/92 | HR 93 | Ht 69.0 in | Wt 174.4 lb

## 2023-09-16 DIAGNOSIS — E785 Hyperlipidemia, unspecified: Secondary | ICD-10-CM | POA: Insufficient documentation

## 2023-09-16 DIAGNOSIS — I351 Nonrheumatic aortic (valve) insufficiency: Secondary | ICD-10-CM | POA: Insufficient documentation

## 2023-09-16 DIAGNOSIS — I4819 Other persistent atrial fibrillation: Secondary | ICD-10-CM | POA: Diagnosis not present

## 2023-09-16 DIAGNOSIS — I4892 Unspecified atrial flutter: Secondary | ICD-10-CM | POA: Insufficient documentation

## 2023-09-16 DIAGNOSIS — Z79899 Other long term (current) drug therapy: Secondary | ICD-10-CM | POA: Insufficient documentation

## 2023-09-16 DIAGNOSIS — I10 Essential (primary) hypertension: Secondary | ICD-10-CM | POA: Insufficient documentation

## 2023-09-16 DIAGNOSIS — I4430 Unspecified atrioventricular block: Secondary | ICD-10-CM | POA: Insufficient documentation

## 2023-09-16 DIAGNOSIS — Z7901 Long term (current) use of anticoagulants: Secondary | ICD-10-CM | POA: Diagnosis not present

## 2023-09-16 DIAGNOSIS — Z8673 Personal history of transient ischemic attack (TIA), and cerebral infarction without residual deficits: Secondary | ICD-10-CM | POA: Diagnosis not present

## 2023-09-16 DIAGNOSIS — D6869 Other thrombophilia: Secondary | ICD-10-CM | POA: Diagnosis not present

## 2023-09-16 NOTE — Progress Notes (Signed)
Primary Care Physician: Evan Garter, MD Referring Physician: self referred Primary EP: [name] is a 72 y.o. male with a h/o afib, atrial flutter, HTN, HLD, TIA who presents for follow up in the [facility]. Patient had been maintained on flecainide for several years and had an ablation at [facility] in 1992. He underwent repeat ablation with Dr Evan Farmer for afib and atrial flutter 08/22/22. He was found to be in atypical atrial flutter 12/12/22 and underwent TEE/DCCV on 12/21/22. Unfortunately, he had quick return of his arrhythmia and flecainide was resumed with repeat DCCV on 01/29/23. He is on Eliquis for a CHADS2VASc score of 4. He went into atrial flutter and underwent another DCCV on 06/10/23.  On follow up today, patient reports that he has done well since his last visit. He remains in SR. No bleeding issues on anticoagulation.   On follow up 09/16/23, he is currently in atrial flutter. His flecainide was increased to 100 mg BID and repeat ECG on 10/11 was stable. He contacted office on 10/21 noting he was back in Afib. No missed doses of anticoagulant. He notes to be going in and out of rhythm. He reports occasionally being tired but overall feels okay. He is going to [place] for 1 month next week.   Today, he denies symptoms of palpitations, chest pain, shortness of breath, orthopnea, PND, lower extremity edema, dizziness, presyncope, syncope, or neurologic sequela. The patient is tolerating medications without difficulties and is otherwise without complaint today.   Past Medical History:  Diagnosis Date   Anxiety    Cataract    Depression    Diverticulosis    HTN (hypertension)    Hyperlipidemia    Paroxysmal A-fib (HCC)    Prostate cancer [facility])    2010 Dr Evan Farmer    [facility] spotted fever    age 31   S/P cardiac cath 02/2011   No obstructive disease. Normal LV function   Scoliosis    TIA (transient ischemic attack)    2012    Tubular adenoma of colon    Vitamin B 12 deficiency    Vitamin D deficiency    Weight gain     Current Outpatient Medications  Medication Sig Dispense Refill   ALPRAZolam (XANAX) 0.5 MG tablet TAKE 1 TABLET TWICE A DAY  AS NEEDED FOR ANXIETY Strength: 0.5 mg 180 tablet 1   apixaban (ELIQUIS) 5 MG TABS tablet Take 1 tablet (5 mg total) by mouth 2 (two) times daily. 180 tablet 2   atenolol (TENORMIN) 25 MG tablet Take 1 tablet (25 mg total) by mouth 2 (two) times daily. May take an extra tablet twice a day for breakthrough afib 200 tablet 2   Cholecalciferol (VITAMIN D3) 50 MCG (2000 UT) capsule Take 1 capsule (2,000 Units total) by mouth daily. 100 capsule 3   ciclopirox (PENLAC) 8 % solution Apply topically at bedtime. Apply over nail and surrounding skin daily 6.6 mL 1   Cyanocobalamin (B-12) 1000 MCG SUBL Place 1,000 mcg under the tongue daily.     erythromycin ophthalmic ointment Place a 1/2 inch ribbon of ointment into the lower eyelid of right eye twice daily for 7 days. 3.5 g 0   flecainide (TAMBOCOR) 100 MG tablet Take 1 tablet (100 mg total) by mouth 2 (two) times daily. 180 tablet 2   losartan (COZAAR) 25 MG tablet Take 1 tablet (25 mg total) by mouth daily. (Patient taking differently: Take  12.5-25 mg by mouth daily as needed (Depending on Blood pressure, normally takes 1/2 tablet daily).) 90 tablet 3   Multiple Vitamins-Minerals (MULTIVITAMIN WITH MINERALS) tablet Take 1 tablet by mouth daily.     predniSONE (DELTASONE) 10 MG tablet Prednisone 10 mg: take 2 tabs a day x 1 days, then 1 tab a day x 2 days, then stop. Take pc. 4 tablet 0   sildenafil (VIAGRA) 100 MG tablet Take 1 tablet (100 mg total) by mouth daily as needed for erectile dysfunction. (Patient taking differently: Take 50 mg by mouth daily as needed for erectile dysfunction.) 12 tablet 5   No current facility-administered medications for this encounter.    ROS- All systems are reviewed and negative except as per the  HPI above  Physical Exam: Vitals:   09/16/23 0829  BP: (!) 116/92  Pulse: 93  Weight: 79.1 kg  Height: 5\' 9"  (1.753 m)    Wt Readings from Last 3 Encounters:  09/16/23 79.1 kg  08/16/23 79.7 kg  07/02/23 78 kg    GEN- The patient is well appearing, alert and oriented x 3 today.   Neck - no JVD or carotid bruit noted Lungs- Clear to ausculation bilaterally, normal work of breathing Heart- Irregular rate and rhythm, no murmurs, rubs or gallops, PMI not laterally displaced Extremities- no clubbing, cyanosis, or edema Skin - no rash or ecchymosis noted   EKG today demonstrates Vent. rate 93 BPM PR interval 240 ms QRS duration 110 ms QT/QTcB 360/447 ms P-R-T axes 76 -5 76 Sinus rhythm with sinus arrhythmia with 1st degree A-V block - appears to be atrial flutter Septal infarct , age undetermined Abnormal ECG When compared with ECG of 23-Aug-2023 08:31, PREVIOUS ECG IS PRESENT  Echo 07/24/22  1. Left ventricular ejection fraction, by estimation, is 55 to 60%. The left ventricle has normal function. The left ventricle has no regional wall motion abnormalities. Diastolic function indeterminant due to Afib.   2. Right ventricular systolic function is normal. The right ventricular size is normal. There is normal pulmonary artery systolic pressure.   3. Left atrial size was moderately dilated.   4. Right atrial size was mildly dilated.   5. The mitral valve is grossly normal. Trivial mitral valve  regurgitation.   6. The aortic valve is tricuspid. There is mild calcification of the  aortic valve. There is mild thickening of the aortic valve. Aortic valve regurgitation is mild. Aortic valve sclerosis/calcification is present, without any evidence of aortic stenosis.   7. Aortic dilatation noted. There is mild dilatation of the aortic root, measuring 41 mm. There is mild dilatation of the ascending aorta, measuring 41 mm.   8. The inferior vena cava is normal in size with greater  than 50%  respiratory variability, suggesting right atrial pressure of 3 mmHg.   Comparison(s): Compard to prior TEE report in 2017, there is no  significant change.    CHA2DS2-VASc Score = 4  The patient's score is based upon: CHF History: 0 HTN History: 1 Diabetes History: 0 Stroke History: 2 (TIA) Vascular Disease History: 0 Age Score: 1 Gender Score: 0       ASSESSMENT AND PLAN: Persistent Atrial Fibrillation/atrial flutter The patient's CHA2DS2-VASc score is 4, indicating a 4.8% annual risk of stroke.   S/p ablation 1992 and 08/22/22 afib and flutter Has had three cardioversions this year.   He appears to be in atrial flutter. He notes to be going in and out of rhythm and is going  to [place] for 1 month next week.  We discussed options including repeat ablation, Tikosyn, amiodarone as bridge. Less likely Multaq due to flecainide not appearing to maintain normal rhythm. He will consider options and for now we will continue regimen without change due to upcoming trip.   Continue Eliquis 5 mg BID Continue flecainide 100 mg BID.  Secondary Hypercoagulable State (ICD10:  D68.69) The patient is at significant risk for stroke/thromboembolism based upon his CHA2DS2-VASc Score of 4.  Continue Apixaban (Eliquis).   HTN Stable on current regimen   Follow up after trip to [place].   Justin Mend, PA-C Afib Clinic [facility] [address] [place], [place] [address] [phone]

## 2023-10-28 ENCOUNTER — Encounter (HOSPITAL_COMMUNITY): Payer: Self-pay | Admitting: Physician Assistant

## 2023-10-28 ENCOUNTER — Ambulatory Visit (HOSPITAL_COMMUNITY)
Admission: RE | Admit: 2023-10-28 | Discharge: 2023-10-28 | Disposition: A | Discharge status: Home / Self Care | Payer: 59 | Source: Ambulatory Visit | Attending: Physician Assistant | Admitting: Physician Assistant

## 2023-10-28 VITALS — BP 132/90 | HR 101 | Ht 69.0 in | Wt 175.0 lb

## 2023-10-28 DIAGNOSIS — I4819 Other persistent atrial fibrillation: Secondary | ICD-10-CM | POA: Diagnosis not present

## 2023-10-28 DIAGNOSIS — D6869 Other thrombophilia: Secondary | ICD-10-CM | POA: Diagnosis not present

## 2023-10-28 DIAGNOSIS — E785 Hyperlipidemia, unspecified: Secondary | ICD-10-CM | POA: Diagnosis not present

## 2023-10-28 DIAGNOSIS — Z8673 Personal history of transient ischemic attack (TIA), and cerebral infarction without residual deficits: Secondary | ICD-10-CM | POA: Insufficient documentation

## 2023-10-28 DIAGNOSIS — I10 Essential (primary) hypertension: Secondary | ICD-10-CM | POA: Insufficient documentation

## 2023-10-28 DIAGNOSIS — Z7901 Long term (current) use of anticoagulants: Secondary | ICD-10-CM | POA: Insufficient documentation

## 2023-10-28 DIAGNOSIS — I483 Typical atrial flutter: Secondary | ICD-10-CM | POA: Diagnosis not present

## 2023-10-28 DIAGNOSIS — Z79899 Other long term (current) drug therapy: Secondary | ICD-10-CM | POA: Diagnosis not present

## 2023-10-28 NOTE — Progress Notes (Signed)
Primary Care Physician: [name], MD Referring Physician: self referred Primary EP: [name] is a 71 y.o. male with a h/o afib, atrial flutter, HTN, HLD, TIA who presents for follow up in the [facility]. Patient had been maintained on flecainide for several years and had an ablation at [facility] in 1992. He underwent repeat ablation with Dr [name] for afib and atrial flutter 08/22/22. He was found to be in atypical atrial flutter 12/12/22 and underwent TEE/DCCV on 12/21/22. Unfortunately, he had quick return of his arrhythmia and flecainide was resumed with repeat DCCV on 01/29/23. He is on Eliquis for a CHADS2VASc score of 4. He went into atrial flutter and underwent another DCCV on 06/10/23. Flecainide increased at 08/16/23 visit.   On follow up today, patient remains in atrial flutter. Patient reports that he does fatigue more easily but otherwise feels well. His heart rates are 90s-low 100s bpm at home. No bleeding issues on anticoagulation.   Today, he denies symptoms of palpitations, chest pain, shortness of breath, orthopnea, PND, lower extremity edema, dizziness, presyncope, syncope, or neurologic sequela. The patient is tolerating medications without difficulties and is otherwise without complaint today.   Past Medical History:  Diagnosis Date   Anxiety    Cataract    Depression    Diverticulosis    HTN (hypertension)    Hyperlipidemia    Paroxysmal A-fib (HCC)    Prostate cancer [facility])    2010 [name]    [facility] spotted fever    age 62   S/P cardiac cath 02/2011   No obstructive disease. Normal LV function   Scoliosis    TIA (transient ischemic attack)    2012   Tubular adenoma of colon    Vitamin B 12 deficiency    Vitamin D deficiency    Weight gain     Current Outpatient Medications  Medication Sig Dispense Refill   ALPRAZolam (XANAX) 0.5 MG tablet TAKE 1 TABLET TWICE A DAY  AS NEEDED FOR ANXIETY Strength:  0.5 mg 180 tablet 1   apixaban (ELIQUIS) 5 MG TABS tablet Take 1 tablet (5 mg total) by mouth 2 (two) times daily. 180 tablet 2   atenolol (TENORMIN) 25 MG tablet Take 1 tablet (25 mg total) by mouth 2 (two) times daily. May take an extra tablet twice a day for breakthrough afib 200 tablet 2   Cholecalciferol (VITAMIN D3) 50 MCG (2000 UT) capsule Take 1 capsule (2,000 Units total) by mouth daily. 100 capsule 3   ciclopirox (PENLAC) 8 % solution Apply topically at bedtime. Apply over nail and surrounding skin daily 6.6 mL 1   Cyanocobalamin (B-12) 1000 MCG SUBL Place 1,000 mcg under the tongue daily.     erythromycin ophthalmic ointment Place a 1/2 inch ribbon of ointment into the lower eyelid of right eye twice daily for 7 days. 3.5 g 0   flecainide (TAMBOCOR) 100 MG tablet Take 1 tablet (100 mg total) by mouth 2 (two) times daily. 180 tablet 2   losartan (COZAAR) 25 MG tablet Take 1 tablet (25 mg total) by mouth daily. (Patient taking differently: Take 12.5-25 mg by mouth daily as needed (Depending on Blood pressure, normally takes 1/2 tablet daily).) 90 tablet 3   Multiple Vitamins-Minerals (MULTIVITAMIN WITH MINERALS) tablet Take 1 tablet by mouth daily.     predniSONE (DELTASONE) 10 MG tablet Prednisone 10 mg: take 2 tabs a day x 1 days, then 1  tab a day x 2 days, then stop. Take pc. 4 tablet 0   sildenafil (VIAGRA) 100 MG tablet Take 1 tablet (100 mg total) by mouth daily as needed for erectile dysfunction. (Patient taking differently: Take 50 mg by mouth daily as needed for erectile dysfunction.) 12 tablet 5   No current facility-administered medications for this encounter.    ROS- All systems are reviewed and negative except as per the HPI above  Physical Exam: Vitals:   10/28/23 0821  Height: 5\' 9"  (1.753 m)    Wt Readings from Last 3 Encounters:  09/16/23 79.1 kg  08/16/23 79.7 kg  07/02/23 78 kg    GEN: Well nourished, well developed in no acute distress NECK: No JVD; No  carotid bruits CARDIAC: Irregularly irregular rate and rhythm, no murmurs, rubs, gallops RESPIRATORY:  Clear to auscultation without rales, wheezing or rhonchi  ABDOMEN: Soft, non-tender, non-distended EXTREMITIES:  No edema; No deformity    EKG today demonstrates Atrial flutter with 2:1 block Vent. rate 101 BPM PR interval 172 ms QRS duration 104 ms QT/QTcB 342/443 ms   Echo 07/24/22  1. Left ventricular ejection fraction, by estimation, is 55 to 60%. The left ventricle has normal function. The left ventricle has no regional wall motion abnormalities. Diastolic function indeterminant due to Afib.   2. Right ventricular systolic function is normal. The right ventricular size is normal. There is normal pulmonary artery systolic pressure.   3. Left atrial size was moderately dilated.   4. Right atrial size was mildly dilated.   5. The mitral valve is grossly normal. Trivial mitral valve  regurgitation.   6. The aortic valve is tricuspid. There is mild calcification of the  aortic valve. There is mild thickening of the aortic valve. Aortic valve regurgitation is mild. Aortic valve sclerosis/calcification is present, without any evidence of aortic stenosis.   7. Aortic dilatation noted. There is mild dilatation of the aortic root, measuring 41 mm. There is mild dilatation of the ascending aorta, measuring 41 mm.   8. The inferior vena cava is normal in size with greater than 50%  respiratory variability, suggesting right atrial pressure of 3 mmHg.   Comparison(s): Compard to prior TEE report in 2017, there is no  significant change.    CHA2DS2-VASc Score = 4  The patient's score is based upon: CHF History: 0 HTN History: 1 Diabetes History: 0 Stroke History: 2 (TIA) Vascular Disease History: 0 Age Score: 1 Gender Score: 0       ASSESSMENT AND PLAN: Persistent Atrial Fibrillation/atrial flutter The patient's CHA2DS2-VASc score is 4, indicating a 4.8% annual risk of stroke.    S/p ablation 1992 and 08/22/22 afib and flutter We discussed rhythm control options today, specifically repeat ablation, dofetilide, and amiodarone. Patient would like to discuss ablation with Dr [name], will refer. If he is not felt to be a good candidate, he is agreeable to dofetilide admission. Information sheet about dofetilide provided today.  Continue Eliquis 5 mg BID Continue flecainide 100 mg BID for now  Secondary Hypercoagulable State (ICD10:  D68.69) The patient is at significant risk for stroke/thromboembolism based upon his CHA2DS2-VASc Score of 4.  Continue Apixaban (Eliquis).   HTN Stable on current regimen   Follow up with Dr [name] to discuss ablation/dofetilide.    Jorja Loa PA-C Afib Clinic [facility] [address] [place], [place] [address] [phone]

## 2023-11-29 ENCOUNTER — Ambulatory Visit: Payer: 59 | Admitting: Cardiovascular Disease

## 2023-12-06 ENCOUNTER — Ambulatory Visit: Payer: 59 | Admitting: Cardiovascular Disease

## 2023-12-20 ENCOUNTER — Telehealth (HOSPITAL_COMMUNITY): Payer: Self-pay

## 2023-12-20 NOTE — Telephone Encounter (Signed)
 Patient called and left a message regarding wanting to be set up for Tikosyn admission. Reached back out to patient and I was not able to speak with him. Left message for patient to call back.

## 2023-12-31 ENCOUNTER — Telehealth: Payer: Self-pay | Admitting: Pharmacist

## 2023-12-31 NOTE — Telephone Encounter (Signed)
 Medication list reviewed in anticipation of upcoming Tikosyn initiation. Patient is not taking any contraindicated or QTc prolonging medications.   Alprazolam can increase the concentration of dofetilide, but the interaction is weak. Monitoring is recommended.  Patient is anticoagulated on Eliquis on the appropriate dose. Please ensure that patient has not missed any anticoagulation doses in the 3 weeks prior to Tikosyn initiation.   Patient will need to be counseled to avoid use of Benadryl while on Tikosyn and in the 2-3 days prior to Tikosyn initiation.  He should stop flecainide at least 3 days prior to admission.

## 2024-01-03 ENCOUNTER — Ambulatory Visit: Payer: 59 | Attending: Cardiovascular Disease | Admitting: Cardiovascular Disease

## 2024-01-03 ENCOUNTER — Encounter: Payer: Self-pay | Admitting: Cardiovascular Disease

## 2024-01-03 VITALS — BP 124/90 | HR 95 | Ht 69.0 in | Wt 175.0 lb

## 2024-01-03 DIAGNOSIS — I4819 Other persistent atrial fibrillation: Secondary | ICD-10-CM | POA: Diagnosis not present

## 2024-01-03 DIAGNOSIS — I484 Atypical atrial flutter: Secondary | ICD-10-CM | POA: Diagnosis not present

## 2024-01-03 NOTE — Progress Notes (Signed)
 Cardiology Office Note:    Date:  01/03/2024   ID:  Evan Farmer, DOB 05/07/1951, MRN [id number]  PCP:  [name], MD   [facility] Providers Cardiologist:  None Electrophysiologist:  Maurice Small, MD     Referring MD: [name], MD   Chief complaint:   History of Present Illness:    Evan Farmer is a 73 y.o. male with a hx of HTN, HLD, TIA atrial fibrillation and flutter referred for rhythm management. He is a former patient of Dr. Johney Frame.  He has a long history of arrhythmia as documented below.  Briefly, he was first diagnosed with some sort of rapid arrhythmia and 1981 or 82.  His rhythm was well controlled for years with flecainide.  He believes he underwent an ablation at [facility] in 1992.  After that, he believes he remained arrhythmia free for years on flecainide.  Within the past year he began to have occasional episodes, and his atenolol dose was adjusted for rate control.  He believes he went into A-fib about 4 to 5 weeks ago and has remained in it since.  His symptoms are fairly mild, but he does have increased fatigue and shortness of breath when in atrial fibrillation.  He is seen as an urgent visit today due to recurrence of arrhythmia.  He noticed an elevated heart rate.  ECG showed an atypical atrial flutter with a different morphology than he has had in the past.  Flecainide was restarted.  His heart rates at home have been running in the 70s and 80s.  His symptoms are fairly mild.  He had a cardioversion with recurrence shortly after.  Flecainide was increased to 100 mg twice daily and another cardioversion performed again, with recurrence of atrial flutter.  He presents today to talk about potential therapies.  Arrhythmia History: First episodes of arrhythmia: occurred in the early 80's. He reports his HR was about 180 bpm. He was treated with antiarrhythmic medication but is not certain of the name. 1987: had recurrence of  arrhythmia. He believes it was AF. Flecainide was started, per his recollection, in the mid-80's 1992: per patient, he had an ablation at [facility] 06/2022: AF recurred and became persistent.  07/18/2022: Seen in AF clinic. ECG showed atrial flutter 08/22/22: Ablation of AF and typical flutter 12/2022: Onset of atypical atrial flutter 12/20/2022 DCCV for atypical flutter with early recurrence; flecainide started and another DCCV performed 7-08/2023 Flutter recurred, flecainide was increased without effect      EKGs/Labs/Other Studies Reviewed:    The following studies were reviewed today: TTE 11/28/22:  1. Left ventricular ejection fraction, by estimation, is 55 to 60%. The  left ventricle has normal function. The left ventricle has no regional  wall motion abnormalities. Diastolic function indeterminant due to Afib.   2. Right ventricular systolic function is normal. The right ventricular  size is normal. There is normal pulmonary artery systolic pressure.   3. Left atrial size was moderately dilated.   4. Right atrial size was mildly dilated.   5. The mitral valve is grossly normal. Trivial mitral valve  regurgitation.   6. The aortic valve is tricuspid. There is mild calcification of the  aortic valve. There is mild thickening of the aortic valve. Aortic valve  regurgitation is mild. Aortic valve sclerosis/calcification is present,  without any evidence of aortic  stenosis.   7. Aortic dilatation noted. There is mild dilatation of the aortic root,  measuring  41 mm. There is mild dilatation of the ascending aorta,  measuring 41 mm.   8. The inferior vena cava is normal in size with greater than 50%  respiratory variability, suggesting right atrial pressure of 3 mmHg.   EKG:  EKG is  ordered today.    Orders placed or performed in visit on 01/03/24   EKG 12-Lead     Recent Labs: 05/21/2023: BUN 23; Creatinine, Ser 1.21; Hemoglobin 17.0; Platelets 147; Potassium 4.7; Sodium 139     Risk Assessment/Calculations:    CHA2DS2-VASc Score = 4   This indicates a 4.8% annual risk of stroke. The patient's score is based upon: CHF History: 0 HTN History: 1 Diabetes History: 0 Stroke History: 2 (TIA) Vascular Disease History: 0 Age Score: 1 Gender Score: 0         Physical Exam:    VS:  BP (!) 124/90 (BP Location: Left Arm, Patient Position: Sitting, Cuff Size: Normal)   Pulse 95   Ht 5\' 9"  (1.753 m)   Wt 175 lb (79.4 kg)   SpO2 97%   BMI 25.84 kg/m     Wt Readings from Last 3 Encounters:  01/03/24 175 lb (79.4 kg)  10/28/23 175 lb (79.4 kg)  09/16/23 174 lb 6.4 oz (79.1 kg)     GEN:  Well nourished, well developed in no acute distress CARDIAC: Irregular rhythm, no murmurs, rubs, gallops RESPIRATORY:  Normal work of breathing MUSCULOSKELETAL: no edema    ASSESSMENT & PLAN:    In order of problems listed above:  Atrial fibrillation: s/p ablation 08/2022  Atypical atrial flutter: occurred 12/2022, with multiple recurrences. I think mapping and ablation with PFA is very reasonable. We made the decision jointly to proceed with mapping and ablation.  We discussed the indication, rationale, logistics, anticipated benefits, and potential risks of the ablation procedure including but not limited to -- bleed at the groin access site, chest pain, damage to nearby organs such as the diaphragm, lungs, or esophagus, need for a drainage tube, or prolonged hospitalization. I explained that the risk for stroke, heart attack, need for open chest surgery, or even death is very low but not zero. he  expressed understanding and wishes to proceed.   Secondary hypercoagulable state: continue eliquis HTN: atenolol refilled. He will continue a total daily dose of 50mg  until the ablation HLD      Follow-up 1 year  Medication Adjustments/Labs and Tests Ordered: Current medicines are reviewed at length with the patient today.  Concerns regarding medicines are outlined  above.  Orders Placed This Encounter  Procedures   EKG 12-Lead   No orders of the defined types were placed in this encounter.    Signed, Maurice Small, MD  01/03/2024 2:40 PM     [facility]

## 2024-01-03 NOTE — Patient Instructions (Addendum)
 Medication Instructions:  Your physician recommends that you continue on your current medications as directed. Please refer to the Current Medication list given to you today. *If you need a refill on your cardiac medications before your next appointment, please call your pharmacy*   Lab Work: CBC and BMET - please have your pre-procedural lab work completed the week of March 10 - This can be done at [facility], no appointment required, and this does not require fasting  If you have labs (blood work) drawn today and your tests are completely normal, you will receive your results only by: MyChart Message (if you have MyChart) OR A paper copy in the mail If you have any lab test that is abnormal or we need to change your treatment, we will call you to review the results.   Testing/Procedures: Cardiac CT - someone from the hospital will contact you to set this up  Your physician has recommended that you have an ablation. Catheter ablation is a medical procedure used to treat some cardiac arrhythmias (irregular heartbeats). During catheter ablation, a long, thin, flexible tube is put into a blood vessel in your groin (upper thigh), or neck. This tube is called an ablation catheter. It is then guided to your heart through the blood vessel. Radio frequency waves destroy small areas of heart tissue where abnormal heartbeats may cause an arrhythmia to start. Please see the instruction sheet given to you today.  Atrial Flutter Ablation - scheduled for Monday, April 7 We will be sending your instruction letter thru MyChart  Your physician has recommended that you have an ablation. Catheter ablation is a medical procedure used to treat some cardiac arrhythmias (irregular heartbeats). During catheter ablation, a long, thin, flexible tube is put into a blood vessel in your groin (upper thigh), or neck. This tube is called an ablation catheter. It is then guided to your heart through the blood vessel. Radio  frequency waves destroy small areas of heart tissue where abnormal heartbeats may cause an arrhythmia to start. Please see the instruction sheet given to you today.   Follow-Up: At [facility], you and your health needs are our priority.  As part of our continuing mission to provide you with exceptional heart care, we have created designated Provider Care Teams.  These Care Teams include your primary Cardiologist (physician) and Advanced Practice Providers (APPs -  Physician Assistants and Nurse Practitioners) who all work together to provide you with the care you need, when you need it.  We recommend signing up for the patient portal called "MyChart".  Sign up information is provided on this After Visit Summary.  MyChart is used to connect with patients for Virtual Visits (Telemedicine).  Patients are able to view lab/test results, encounter notes, upcoming appointments, etc.  Non-urgent messages can be sent to your provider as well.   To learn more about what you can do with MyChart, go to [email or web address].    Your next appointment:   We will schedule follow up after ablation  Provider:   York Pellant, MD

## 2024-01-07 ENCOUNTER — Ambulatory Visit: Payer: 59 | Admitting: Internal Medicine

## 2024-01-07 ENCOUNTER — Encounter: Payer: Self-pay | Admitting: Internal Medicine

## 2024-01-07 VITALS — BP 138/80 | HR 94 | Temp 98.2°F | Ht 69.0 in | Wt 181.0 lb

## 2024-01-07 DIAGNOSIS — E538 Deficiency of other specified B group vitamins: Secondary | ICD-10-CM

## 2024-01-07 DIAGNOSIS — F419 Anxiety disorder, unspecified: Secondary | ICD-10-CM

## 2024-01-07 DIAGNOSIS — E559 Vitamin D deficiency, unspecified: Secondary | ICD-10-CM

## 2024-01-07 DIAGNOSIS — I480 Paroxysmal atrial fibrillation: Secondary | ICD-10-CM

## 2024-01-07 NOTE — Assessment & Plan Note (Signed)
 Chronic  Zoloft, prn Xanax  Potential benefits of a long term benzodiazepines  use as well as potential risks  and complications were explained to the patient and were aknowledged.

## 2024-01-07 NOTE — Assessment & Plan Note (Signed)
 Cont on Eliquis, Flecainide  and Atenolol S/p cardioversion x3, ablation x1 Another ablation is planned

## 2024-01-07 NOTE — Progress Notes (Signed)
 Subjective:  Patient ID: Evan Farmer, male    DOB: 04-19-1951  Age: 73 y.o. MRN: [id number]  CC: Medical Management of Chronic Issues (6 mnth f/u)   HPI Evan Farmer presents for A fib, anxiety, HTN, anticoagulation  Outpatient Medications Prior to Visit  Medication Sig Dispense Refill   ALPRAZolam (XANAX) 0.5 MG tablet TAKE 1 TABLET TWICE A DAY  AS NEEDED FOR ANXIETY Strength: 0.5 mg (Patient taking differently: Take 0.5 mg by mouth. TAKE 1 TABLET TWICE A DAY  AS NEEDED FOR ANXIETY Strength: 0.5 mg Pt takes 1/2 tablet at bedtime) 180 tablet 1   apixaban (ELIQUIS) 5 MG TABS tablet Take 1 tablet (5 mg total) by mouth 2 (two) times daily. 180 tablet 2   atenolol (TENORMIN) 25 MG tablet Take 1 tablet (25 mg total) by mouth 2 (two) times daily. May take an extra tablet twice a day for breakthrough afib 200 tablet 2   Cholecalciferol (VITAMIN D3) 50 MCG (2000 UT) capsule Take 1 capsule (2,000 Units total) by mouth daily. 100 capsule 3   ciclopirox (PENLAC) 8 % solution Apply topically at bedtime. Apply over nail and surrounding skin daily 6.6 mL 1   Cyanocobalamin (B-12) 1000 MCG SUBL Place 1,000 mcg under the tongue daily.     flecainide (TAMBOCOR) 100 MG tablet Take 1 tablet (100 mg total) by mouth 2 (two) times daily. 180 tablet 2   losartan (COZAAR) 25 MG tablet Take 1 tablet (25 mg total) by mouth daily. (Patient taking differently: Take 12.5-25 mg by mouth daily as needed (Depending on Blood pressure, normally takes 1/2 tablet daily).) 90 tablet 3   Multiple Vitamins-Minerals (MULTIVITAMIN WITH MINERALS) tablet Take 1 tablet by mouth daily.     sildenafil (VIAGRA) 100 MG tablet Take 1 tablet (100 mg total) by mouth daily as needed for erectile dysfunction. (Patient taking differently: Take 50 mg by mouth daily as needed for erectile dysfunction.) 12 tablet 5   erythromycin ophthalmic ointment Place a 1/2 inch ribbon of ointment into the lower eyelid of right eye twice daily for 7 days.  (Patient not taking: Reported on 01/07/2024) 3.5 g 0   predniSONE (DELTASONE) 10 MG tablet Prednisone 10 mg: take 2 tabs a day x 1 days, then 1 tab a day x 2 days, then stop. Take pc. (Patient not taking: Reported on 01/07/2024) 4 tablet 0   No facility-administered medications prior to visit.    ROS: Review of Systems  Constitutional:  Negative for appetite change, fatigue and unexpected weight change.  HENT:  Negative for congestion, nosebleeds, sneezing, sore throat and trouble swallowing.   Eyes:  Negative for itching and visual disturbance.  Respiratory:  Negative for cough.   Cardiovascular:  Negative for chest pain, palpitations and leg swelling.  Gastrointestinal:  Negative for abdominal distention, blood in stool, diarrhea and nausea.  Genitourinary:  Negative for frequency and hematuria.  Musculoskeletal:  Negative for back pain, gait problem, joint swelling and neck pain.  Skin:  Negative for rash.  Neurological:  Negative for dizziness, tremors, speech difficulty and weakness.  Psychiatric/Behavioral:  Positive for sleep disturbance. Negative for agitation and dysphoric mood. The patient is not nervous/anxious.     Objective:  BP 138/80   Pulse 94   Temp 98.2 F (36.8 C) (Oral)   Ht 5\' 9"  (1.753 m)   Wt 181 lb (82.1 kg)   SpO2 96%   BMI 26.73 kg/m   BP Readings from Last 3 Encounters:  01/07/24  138/80  01/03/24 (!) 124/90  10/28/23 (!) 132/90    Wt Readings from Last 3 Encounters:  01/07/24 181 lb (82.1 kg)  01/03/24 175 lb (79.4 kg)  10/28/23 175 lb (79.4 kg)    Physical Exam Constitutional:      General: He is not in acute distress.    Appearance: Normal appearance. He is well-developed.     Comments: NAD  Eyes:     Conjunctiva/sclera: Conjunctivae normal.     Pupils: Pupils are equal, round, and reactive to light.  Neck:     Thyroid: No thyromegaly.     Vascular: No JVD.  Cardiovascular:     Rate and Rhythm: Normal rate and regular rhythm.      Heart sounds: Normal heart sounds. No murmur heard.    No friction rub. No gallop.  Pulmonary:     Effort: Pulmonary effort is normal. No respiratory distress.     Breath sounds: Normal breath sounds. No wheezing or rales.  Chest:     Chest wall: No tenderness.  Abdominal:     General: Bowel sounds are normal. There is no distension.     Palpations: Abdomen is soft. There is no mass.     Tenderness: There is no abdominal tenderness. There is no guarding or rebound.  Musculoskeletal:        General: No tenderness. Normal range of motion.     Cervical back: Normal range of motion.     Right lower leg: No edema.     Left lower leg: No edema.  Lymphadenopathy:     Cervical: No cervical adenopathy.  Skin:    General: Skin is warm and dry.     Findings: No rash.  Neurological:     Mental Status: He is alert and oriented to person, place, and time.     Cranial Nerves: No cranial nerve deficit.     Motor: No abnormal muscle tone.     Coordination: Coordination normal.     Gait: Gait normal.     Deep Tendon Reflexes: Reflexes are normal and symmetric.  Psychiatric:        Behavior: Behavior normal.        Thought Content: Thought content normal.        Judgment: Judgment normal.    RRR, slight tachycardia   Lab Results  Component Value Date   WBC 4.2 05/21/2023   HGB 17.0 05/21/2023   HCT 49.9 05/21/2023   PLT 147 (L) 05/21/2023   GLUCOSE 108 (H) 05/21/2023   CHOL 197 07/03/2022   TRIG 132.0 07/03/2022   HDL 41.40 07/03/2022   LDLCALC 130 (H) 07/03/2022   ALT 10 07/03/2022   AST 14 07/03/2022   NA 139 05/21/2023   K 4.7 05/21/2023   CL 101 05/21/2023   CREATININE 1.21 05/21/2023   BUN 23 05/21/2023   CO2 30 05/21/2023   TSH 1.47 01/02/2022   PSA <0.1 06/20/2020   INR 0.95 02/12/2011   HGBA1C  01/20/2011    5.2 (NOTE)                                                                       According to the ADA Clinical Practice Recommendations for 2011, when HbA1c is  used as a screening test:   >=6.5%   Diagnostic of Diabetes Mellitus           (if abnormal result  is confirmed)  5.7-6.4%   Increased risk of developing Diabetes Mellitus  References:Diagnosis and Classification of Diabetes Mellitus,Diabetes Care,2011,34(Suppl 1):S62-S69 and Standards of Medical Care in         Diabetes - 2011,Diabetes Care,2011,34  (Suppl 1):S11-S61.    No results found.  Assessment & Plan:   Problem List Items Addressed This Visit     B12 deficiency   On B12      Vitamin D deficiency - Primary   Chronic  Vit D      Anxiety disorder   Chronic  Zoloft, prn Xanax  Potential benefits of a long term benzodiazepines  use as well as potential risks  and complications were explained to the patient and were aknowledged.      Atrial fibrillation (HCC)   Cont on Eliquis, Flecainide  and Atenolol S/p cardioversion x3, ablation x1 Another ablation is planned       Paroxysmal A-fib (HCC)   Cont on Atenolol, Flecanide, Eliquis         No orders of the defined types were placed in this encounter.     Follow-up: No follow-ups on file.  Sonda Primes, MD

## 2024-01-07 NOTE — Assessment & Plan Note (Signed)
 On B12

## 2024-01-07 NOTE — Assessment & Plan Note (Signed)
 Chronic: Vit D

## 2024-01-07 NOTE — Assessment & Plan Note (Signed)
 Cont on Atenolol, Flecanide, Eliquis

## 2024-01-14 ENCOUNTER — Ambulatory Visit (HOSPITAL_COMMUNITY): Payer: 59 | Admitting: Physician Assistant

## 2024-01-20 ENCOUNTER — Other Ambulatory Visit (HOSPITAL_COMMUNITY): Payer: Self-pay | Admitting: *Deleted

## 2024-01-20 MED ORDER — APIXABAN 5 MG PO TABS: 5.0000 mg | ORAL_TABLET | Freq: Two times a day (BID) | ORAL | 2 refills | Status: DC

## 2024-01-23 ENCOUNTER — Telehealth (HOSPITAL_COMMUNITY): Payer: Self-pay

## 2024-01-23 NOTE — Addendum Note (Signed)
 Addended by: Primitivo Gauze on: 01/23/2024 02:44 PM   Modules accepted: Orders

## 2024-01-23 NOTE — Telephone Encounter (Signed)
 Spoke with patient to complete one month pre-procedure call.     New medical conditions?  No Recent hospitalizations or surgeries? No Started any new medications? No Patient made aware to contact office to inform of any new medications started. Any changes in activities of daily living? No  Pre-procedure testing scheduled: CT on 01/27/24 and lab work on 01/24/24. Confirmed patient is taking Eliquis and will continue taking medication before procedure or it may need to be rescheduled.  Confirmed patient is scheduled for Atrial Fibrillation Ablation on Monday, April 7 with Dr. York Pellant. Instructed patient to arrive at the Main Entrance A at [facility]: [address] [place], [place] [address] and check in at Admitting at 5:30 AM  Advised of plan to go home the same day and will only stay overnight if medically necessary. You MUST have a responsible adult to drive you home and MUST be with you the first 24 hours after you arrive home or your procedure could be cancelled.  Patient verbalized understanding to information provided and is agreeable to proceed with procedure.

## 2024-01-23 NOTE — Telephone Encounter (Signed)
 Attempted to reach patient to discuss upcoming procedure, no answer. Left VM for patient to return call.

## 2024-01-25 LAB — CBC
Hematocrit: 47.5 % (ref 37.5–51.0)
Hemoglobin: 16 g/dL (ref 13.0–17.7)
MCH: 33.5 pg — ABNORMAL HIGH (ref 26.6–33.0)
MCHC: 33.7 g/dL (ref 31.5–35.7)
MCV: 99 fL — ABNORMAL HIGH (ref 79–97)
Platelets: 159 10*3/uL (ref 150–450)
RBC: 4.78 x10E6/uL (ref 4.14–5.80)
RDW: 12.4 % (ref 11.6–15.4)
WBC: 4.1 10*3/uL (ref 3.4–10.8)

## 2024-01-25 LAB — BASIC METABOLIC PANEL
BUN/Creatinine Ratio: 21 (ref 10–24)
BUN: 23 mg/dL (ref 8–27)
CO2: 26 mmol/L (ref 20–29)
Calcium: 8.5 mg/dL — ABNORMAL LOW (ref 8.6–10.2)
Chloride: 100 mmol/L (ref 96–106)
Creatinine, Ser: 1.08 mg/dL (ref 0.76–1.27)
Glucose: 90 mg/dL (ref 70–99)
Potassium: 4.8 mmol/L (ref 3.5–5.2)
Sodium: 140 mmol/L (ref 134–144)
eGFR: 73 mL/min/{1.73_m2} (ref >59)

## 2024-01-27 ENCOUNTER — Ambulatory Visit (HOSPITAL_COMMUNITY)
Admission: RE | Admit: 2024-01-27 | Discharge: 2024-01-27 | Disposition: A | Discharge status: Home / Self Care | Payer: 59 | Source: Ambulatory Visit | Attending: Cardiovascular Disease | Admitting: Cardiovascular Disease

## 2024-01-27 ENCOUNTER — Encounter: Payer: Self-pay | Admitting: Cardiovascular Disease

## 2024-01-27 DIAGNOSIS — I4819 Other persistent atrial fibrillation: Secondary | ICD-10-CM | POA: Insufficient documentation

## 2024-01-27 DIAGNOSIS — I484 Atypical atrial flutter: Secondary | ICD-10-CM | POA: Diagnosis present

## 2024-01-27 MED ORDER — IOHEXOL 350 MG/ML SOLN
75.0000 mL | Freq: Once | INTRAVENOUS | Status: AC | PRN
  Administered 2024-01-27: 75 mL via INTRAVENOUS

## 2024-01-29 ENCOUNTER — Encounter: Payer: Self-pay | Admitting: Cardiovascular Disease

## 2024-02-10 ENCOUNTER — Telehealth (HOSPITAL_COMMUNITY): Payer: Self-pay

## 2024-02-10 NOTE — Telephone Encounter (Signed)
 Attempted to reach patient to discuss upcoming procedure, no answer. Left VM for patient to return call.

## 2024-02-12 ENCOUNTER — Telehealth (HOSPITAL_COMMUNITY): Payer: Self-pay

## 2024-02-12 NOTE — Telephone Encounter (Signed)
 Received a return call from patient to discuss upcoming procedure.   CT: completed.  Labs: completed.   Any recent signs of acute illness or been started on antibiotics? No Any new medications started? No Any medications to hold? No Any missed doses of blood thinner? NO Advised patient to continue taking ANTICOAGULANT: Eliquis (Apixaban) without missing any doses.  Medication instructions:  On the morning of your procedure DO NOT take any medication., including Eliquis or the procedure may be rescheduled. Nothing to eat or drink after midnight prior to your procedure.  Confirmed patient is scheduled for Atrial Fibrillation Ablation on Monday, April 7 with Dr. York Pellant. Instructed patient to arrive at the Main Entrance A at [facility]: [address] [place], [place] [address] and check in at Admitting at 5:30 AM.  Advised of plan to go home the same day and will only stay overnight if medically necessary. You MUST have a responsible adult to drive you home and MUST be with you the first 24 hours after you arrive home or your procedure could be cancelled.  Patient verbalized understanding to all instructions provided and agreed to proceed with procedure.

## 2024-02-14 NOTE — Pre-Procedure Instructions (Signed)
 Attempted to call patient regarding procedure instructions.  Left voicemail on the following items: Arrival time 0515 Nothing to eat or drink after midnight No meds AM of procedure Responsible person to drive you home and stay with you for 24 hrs  Have you missed any doses of anti-coagulant Elqiuis- should be taken twice a day, if you have missed any doses please let us know.  Don't take morning of procedure.

## 2024-02-17 ENCOUNTER — Encounter (HOSPITAL_COMMUNITY)
Admission: RE | Disposition: A | Discharge status: Home / Self Care | Payer: Self-pay | Source: Home / Self Care | Attending: Cardiovascular Disease

## 2024-02-17 ENCOUNTER — Ambulatory Visit (HOSPITAL_BASED_OUTPATIENT_CLINIC_OR_DEPARTMENT_OTHER)

## 2024-02-17 ENCOUNTER — Ambulatory Visit (HOSPITAL_COMMUNITY)
Admission: RE | Admit: 2024-02-17 | Discharge: 2024-02-17 | Disposition: A | Discharge status: Home / Self Care | Payer: 59 | Attending: Cardiovascular Disease | Admitting: Cardiovascular Disease

## 2024-02-17 ENCOUNTER — Ambulatory Visit (HOSPITAL_COMMUNITY)

## 2024-02-17 DIAGNOSIS — I4891 Unspecified atrial fibrillation: Secondary | ICD-10-CM | POA: Diagnosis not present

## 2024-02-17 DIAGNOSIS — Z7901 Long term (current) use of anticoagulants: Secondary | ICD-10-CM | POA: Diagnosis not present

## 2024-02-17 DIAGNOSIS — D6869 Other thrombophilia: Secondary | ICD-10-CM | POA: Diagnosis not present

## 2024-02-17 DIAGNOSIS — Z79899 Other long term (current) drug therapy: Secondary | ICD-10-CM | POA: Diagnosis not present

## 2024-02-17 DIAGNOSIS — I4892 Unspecified atrial flutter: Secondary | ICD-10-CM | POA: Diagnosis not present

## 2024-02-17 DIAGNOSIS — I483 Typical atrial flutter: Secondary | ICD-10-CM

## 2024-02-17 DIAGNOSIS — F419 Anxiety disorder, unspecified: Secondary | ICD-10-CM | POA: Insufficient documentation

## 2024-02-17 DIAGNOSIS — Z8673 Personal history of transient ischemic attack (TIA), and cerebral infarction without residual deficits: Secondary | ICD-10-CM | POA: Insufficient documentation

## 2024-02-17 DIAGNOSIS — E785 Hyperlipidemia, unspecified: Secondary | ICD-10-CM | POA: Insufficient documentation

## 2024-02-17 DIAGNOSIS — I484 Atypical atrial flutter: Secondary | ICD-10-CM | POA: Diagnosis present

## 2024-02-17 DIAGNOSIS — I10 Essential (primary) hypertension: Secondary | ICD-10-CM

## 2024-02-17 DIAGNOSIS — F418 Other specified anxiety disorders: Secondary | ICD-10-CM

## 2024-02-17 HISTORY — PX: ATRIAL FIBRILLATION ABLATION: EP1191

## 2024-02-17 SURGERY — ATRIAL FIBRILLATION ABLATION
Anesthesia: General

## 2024-02-17 MED ORDER — LIDOCAINE 2% (20 MG/ML) 5 ML SYRINGE
INTRAMUSCULAR | Status: DC | PRN
  Administered 2024-02-17: 60 mg via INTRAVENOUS

## 2024-02-17 MED ORDER — PHENYLEPHRINE HCL-NACL 20-0.9 MG/250ML-% IV SOLN
INTRAVENOUS | Status: DC | PRN
  Administered 2024-02-17: 40 ug/min via INTRAVENOUS

## 2024-02-17 MED ORDER — ONDANSETRON HCL 4 MG/2ML IJ SOLN
4.0000 mg | Freq: Four times a day (QID) | INTRAMUSCULAR | Status: DC | PRN
Start: 2024-02-17 — End: 2024-02-17

## 2024-02-17 MED ORDER — HEPARIN SODIUM (PORCINE) 1000 UNIT/ML IJ SOLN
INTRAMUSCULAR | Status: AC
  Filled 2024-02-17: qty 10

## 2024-02-17 MED ORDER — SODIUM CHLORIDE 0.9 % IV SOLN: INTRAVENOUS | Status: DC

## 2024-02-17 MED ORDER — FENTANYL CITRATE (PF) 100 MCG/2ML IJ SOLN
INTRAMUSCULAR | Status: AC
  Filled 2024-02-17: qty 2

## 2024-02-17 MED ORDER — NITROGLYCERIN 1 MG/10 ML FOR IR/CATH LAB
INTRA_ARTERIAL | Status: DC | PRN
  Administered 2024-02-17: 20 mL via INTRAVENOUS

## 2024-02-17 MED ORDER — DEXAMETHASONE SODIUM PHOSPHATE 10 MG/ML IJ SOLN
INTRAMUSCULAR | Status: DC | PRN
Start: 2024-02-17 — End: 2024-02-17
  Administered 2024-02-17: 10 mg via INTRAVENOUS

## 2024-02-17 MED ORDER — ACETAMINOPHEN 325 MG PO TABS: 650.0000 mg | ORAL_TABLET | ORAL | Status: DC | PRN

## 2024-02-17 MED ORDER — NITROGLYCERIN 1 MG/10 ML FOR IR/CATH LAB
INTRA_ARTERIAL | Status: AC
  Filled 2024-02-17: qty 20

## 2024-02-17 MED ORDER — PROTAMINE SULFATE 10 MG/ML IV SOLN
INTRAVENOUS | Status: DC | PRN
  Administered 2024-02-17: 20 mg via INTRAVENOUS

## 2024-02-17 MED ORDER — HEPARIN SODIUM (PORCINE) 1000 UNIT/ML IJ SOLN
INTRAMUSCULAR | Status: DC | PRN
  Administered 2024-02-17: 15000 [IU] via INTRAVENOUS

## 2024-02-17 MED ORDER — ATROPINE SULFATE 1 MG/10ML IJ SOSY
PREFILLED_SYRINGE | INTRAMUSCULAR | Status: AC
  Filled 2024-02-17: qty 10

## 2024-02-17 MED ORDER — PHENYLEPHRINE 80 MCG/ML (10ML) SYRINGE FOR IV PUSH (FOR BLOOD PRESSURE SUPPORT)
PREFILLED_SYRINGE | INTRAVENOUS | Status: DC | PRN
  Administered 2024-02-17: 240 ug via INTRAVENOUS
  Administered 2024-02-17: 160 ug via INTRAVENOUS
  Administered 2024-02-17: 80 ug via INTRAVENOUS
  Administered 2024-02-17: 320 ug via INTRAVENOUS
  Administered 2024-02-17 (×2): 160 ug via INTRAVENOUS
  Administered 2024-02-17: 400 ug via INTRAVENOUS
  Administered 2024-02-17: 200 ug via INTRAVENOUS
  Administered 2024-02-17: 400 ug via INTRAVENOUS
  Administered 2024-02-17 (×2): 160 ug via INTRAVENOUS

## 2024-02-17 MED ORDER — SUGAMMADEX SODIUM 200 MG/2ML IV SOLN
INTRAVENOUS | Status: DC | PRN
Start: 2024-02-17 — End: 2024-02-17
  Administered 2024-02-17: 200 mg via INTRAVENOUS

## 2024-02-17 MED ORDER — ONDANSETRON HCL 4 MG/2ML IJ SOLN
INTRAMUSCULAR | Status: DC | PRN
  Administered 2024-02-17: 4 mg via INTRAVENOUS

## 2024-02-17 MED ORDER — ROCURONIUM BROMIDE 10 MG/ML (PF) SYRINGE
PREFILLED_SYRINGE | INTRAVENOUS | Status: DC | PRN
  Administered 2024-02-17 (×2): 20 mg via INTRAVENOUS
  Administered 2024-02-17: 10 mg via INTRAVENOUS
  Administered 2024-02-17: 50 mg via INTRAVENOUS

## 2024-02-17 MED ORDER — ATENOLOL 25 MG PO TABS
25.0000 mg | ORAL_TABLET | Freq: Every day | ORAL | 2 refills | Status: DC
Start: 2024-02-17 — End: 2024-06-12

## 2024-02-17 MED ORDER — HEPARIN (PORCINE) IN NACL 1000-0.9 UT/500ML-% IV SOLN
INTRAVENOUS | Status: DC | PRN
  Administered 2024-02-17 (×3): 500 mL

## 2024-02-17 MED ORDER — SODIUM CHLORIDE 0.9% FLUSH: 3.0000 mL | INTRAVENOUS | Status: DC | PRN

## 2024-02-17 MED ORDER — SODIUM CHLORIDE 0.9 % IV SOLN: 250.0000 mL | INTRAVENOUS | Status: DC | PRN

## 2024-02-17 MED ORDER — PROPOFOL 10 MG/ML IV BOLUS
INTRAVENOUS | Status: DC | PRN
  Administered 2024-02-17: 200 mg via INTRAVENOUS

## 2024-02-17 MED ORDER — FENTANYL CITRATE (PF) 100 MCG/2ML IJ SOLN
INTRAMUSCULAR | Status: DC | PRN
  Administered 2024-02-17: 100 ug via INTRAVENOUS

## 2024-02-17 SURGICAL SUPPLY — 23 items
BAG SNAP BAND KOVER 36X36 (MISCELLANEOUS) IMPLANT
CABLE PFA RX CATH CONN (CABLE) IMPLANT
CATH 8FR REPROCESSED SOUNDSTAR (CATHETERS) ×1 IMPLANT
CATH 8FR SOUNDSTAR REPROCESSED (CATHETERS) IMPLANT
CATH ABLAT QDOT MICRO BI TC DF (CATHETERS) IMPLANT
CATH FARAWAVE ABLATION 31 (CATHETERS) IMPLANT
CATH OCTARAY 2.0 F 3-3-3-3-3 (CATHETERS) IMPLANT
CATH WEBSTER BI DIR CS D-F CRV (CATHETERS) IMPLANT
CLOSURE PERCLOSE PROSTYLE (VASCULAR PRODUCTS) IMPLANT
COVER SWIFTLINK CONNECTOR (BAG) ×1 IMPLANT
DEVICE CLOSURE MYNXGRIP 6/7F (Vascular Products) IMPLANT
DILATOR VESSEL 38 20CM 16FR (INTRODUCER) IMPLANT
GUIDEWIRE INQWIRE 1.5J.035X260 (WIRE) IMPLANT
INQWIRE 1.5J .035X260CM (WIRE) ×1 IMPLANT
KIT VERSACROSS CNCT FARADRIVE (KITS) IMPLANT
PACK EP LF (CUSTOM PROCEDURE TRAY) ×1 IMPLANT
PAD DEFIB RADIO PHYSIO CONN (PAD) ×1 IMPLANT
SHEATH FARADRIVE STEERABLE (SHEATH) IMPLANT
SHEATH INTROD W/O MIN 9FR 25CM (SHEATH) IMPLANT
SHEATH PINNACLE 8F 10CM (SHEATH) IMPLANT
SHEATH PROBE COVER 6X72 (BAG) IMPLANT
TUBING SMART ABLATE COOLFLOW (TUBING) IMPLANT
WIRE EMERALD 3MM-J .035X150CM (WIRE) IMPLANT

## 2024-02-17 NOTE — Progress Notes (Signed)
 Patient walked to the bathroom without difficulties, bilateral groins level 0, clean, dry, and intact.

## 2024-02-17 NOTE — Discharge Instructions (Signed)

## 2024-02-17 NOTE — Anesthesia Preprocedure Evaluation (Addendum)
 Anesthesia Evaluation  Patient identified by MRN, date of birth, ID band Patient awake    Reviewed: Allergy & Precautions, NPO status , Patient's Chart, lab work & pertinent test results  Airway Mallampati: III  TM Distance: >3 FB Neck ROM: Full    Dental no notable dental hx.    Pulmonary neg pulmonary ROS   Pulmonary exam normal        Cardiovascular hypertension, Pt. on home beta blockers and Pt. on medications + dysrhythmias Atrial Fibrillation  Rhythm:Irregular Rate:Tachycardia     Neuro/Psych  PSYCHIATRIC DISORDERS Anxiety Depression    TIA   GI/Hepatic negative GI ROS, Neg liver ROS,,,  Endo/Other  negative endocrine ROS    Renal/GU negative Renal ROS     Musculoskeletal negative musculoskeletal ROS (+)    Abdominal   Peds  Hematology  (+) Blood dyscrasia (Eliquis)   Anesthesia Other Findings A-fib/Aflutter with RVR  Reproductive/Obstetrics                             Anesthesia Physical Anesthesia Plan  ASA: 4  Anesthesia Plan: General   Post-op Pain Management:    Induction: Intravenous  PONV Risk Score and Plan: 2 and Ondansetron, Dexamethasone, Treatment may vary due to age or medical condition and Midazolam  Airway Management Planned: Oral ETT  Additional Equipment:   Intra-op Plan:   Post-operative Plan: Extubation in OR  Informed Consent: I have reviewed the patients History and Physical, chart, labs and discussed the procedure including the risks, benefits and alternatives for the proposed anesthesia with the patient or authorized representative who has indicated his/her understanding and acceptance.     Dental advisory given  Plan Discussed with: CRNA  Anesthesia Plan Comments:        Anesthesia Quick Evaluation

## 2024-02-17 NOTE — Anesthesia Procedure Notes (Signed)
 Procedure Name: Intubation Date/Time: 02/17/2024 7:37 AM  Performed by: Georgianne Fick D, CRNAPre-anesthesia Checklist: Patient identified, Emergency Drugs available, Suction available and Patient being monitored Patient Re-evaluated:Patient Re-evaluated prior to induction Oxygen Delivery Method: Circle System Utilized Preoxygenation: Pre-oxygenation with 100% oxygen Induction Type: IV induction Ventilation: Mask ventilation without difficulty Laryngoscope Size: Mac and 4 Grade View: Grade II Tube type: Oral Tube size: 7.5 mm Number of attempts: 1 Airway Equipment and Method: Stylet and Oral airway Placement Confirmation: ETT inserted through vocal cords under direct vision, positive ETCO2 and breath sounds checked- equal and bilateral Secured at: 24 cm Tube secured with: Tape Dental Injury: Teeth and Oropharynx as per pre-operative assessment

## 2024-02-17 NOTE — Anesthesia Postprocedure Evaluation (Signed)
 Anesthesia Post Note  Patient: Evan Farmer  Procedure(s) Performed: ATRIAL FIBRILLATION ABLATION     Patient location during evaluation: Cath Lab Anesthesia Type: General Level of consciousness: awake Pain management: pain level controlled Vital Signs Assessment: post-procedure vital signs reviewed and stable Respiratory status: spontaneous breathing, nonlabored ventilation and respiratory function stable Cardiovascular status: blood pressure returned to baseline and stable Postop Assessment: no apparent nausea or vomiting Anesthetic complications: no   No notable events documented.  Last Vitals:  Vitals:   02/17/24 1330 02/17/24 1400  BP: 114/72 99/62  Pulse: 78 75  Resp: (!) 23 11  Temp:    SpO2: 94% 92%    Last Pain:  Vitals:   02/17/24 1155  TempSrc:   PainSc: 0-No pain                 Kelia Gibbon P Kaliq Lege

## 2024-02-17 NOTE — H&P (Signed)
 Cardiology Office Note:    Date:  02/17/2024   ID:  Evan Farmer, DOB Sep 25, 1951, MRN [id number]  PCP:  Evan Garter, MD   [facility] Providers Cardiologist:  None Electrophysiologist:  Evan Small, MD     Referring MD: Evan Small, MD   Chief complaint:   History of Present Illness:    Evan Farmer is a 73 y.o. male with a hx of HTN, HLD, TIA atrial fibrillation and flutter referred for rhythm management. He is a former patient of Dr. Johney Farmer.  He has a long history of arrhythmia as documented below.  Briefly, he was first diagnosed with some sort of rapid arrhythmia and 1981 or 82.  His rhythm was well controlled for years with flecainide.  He believes he underwent an ablation at [facility] in 1992.  After that, he believes he remained arrhythmia free for years on flecainide.  Within the past year he began to have occasional episodes, and his atenolol dose was adjusted for rate control.  He believes he went into A-fib about 4 to 5 weeks ago and has remained in it since.  His symptoms are fairly mild, but he does have increased fatigue and shortness of breath when in atrial fibrillation.  He is seen as an urgent visit today due to recurrence of arrhythmia.  He noticed an elevated heart rate.  ECG showed an atypical atrial flutter with a different morphology than he has had in the past.  Flecainide was restarted.  His heart rates at home have been running in the 70s and 80s.  His symptoms are fairly mild.  He had a cardioversion with recurrence shortly after.  Flecainide was increased to 100 mg twice daily and another cardioversion performed again, with recurrence of atrial flutter.  He presents today to talk about potential therapies.  Arrhythmia History: First episodes of arrhythmia: occurred in the early 80's. He reports his HR was about 180 bpm. He was treated with antiarrhythmic medication but is not certain of the name. 1987: had recurrence of  arrhythmia. He believes it was AF. Flecainide was started, per his recollection, in the mid-80's 1992: per patient, he had an ablation at [facility] 06/2022: AF recurred and became persistent.  07/18/2022: Seen in AF clinic. ECG showed atrial flutter 08/22/22: Ablation of AF and typical flutter 12/2022: Onset of atypical atrial flutter 12/20/2022 DCCV for atypical flutter with early recurrence; flecainide started and another DCCV performed 7-08/2023 Flutter recurred, flecainide was increased without effect   I reviewed the patient's CT and labs. There was no LAA thrombus. he  has not missed any doses of anticoagulation, and he took his dose last night. There have been no changes in the patient's diagnoses, medications, or condition since our recent clinic visit.    EKGs/Labs/Other Studies Reviewed:    The following studies were reviewed today: TTE 2022/11/01:  1. Left ventricular ejection fraction, by estimation, is 55 to 60%. The  left ventricle has normal function. The left ventricle has no regional  wall motion abnormalities. Diastolic function indeterminant due to Afib.   2. Right ventricular systolic function is normal. The right ventricular  size is normal. There is normal pulmonary artery systolic pressure.   3. Left atrial size was moderately dilated.   4. Right atrial size was mildly dilated.   5. The mitral valve is grossly normal. Trivial mitral valve  regurgitation.   6. The aortic valve is tricuspid. There is mild calcification of the  aortic valve. There is mild thickening of the aortic valve. Aortic valve  regurgitation is mild. Aortic valve sclerosis/calcification is present,  without any evidence of aortic  stenosis.   7. Aortic dilatation noted. There is mild dilatation of the aortic root,  measuring 41 mm. There is mild dilatation of the ascending aorta,  measuring 41 mm.   8. The inferior vena cava is normal in size with greater than 50%  respiratory variability,  suggesting right atrial pressure of 3 mmHg.   EKG:  EKG is  ordered today.    Orders placed or performed in visit on 01/03/24   EKG 12-Lead     Recent Labs: 01/24/2024: BUN 23; Creatinine, Ser 1.08; Hemoglobin 16.0; Platelets 159; Potassium 4.8; Sodium 140    Risk Assessment/Calculations:    CHA2DS2-VASc Score =     This indicates a  % annual risk of stroke. 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 I think mapping and ablation with PFA is very reasonable. We made the decision jointly to proceed with mapping and ablation.  We discussed the indication, rationale, logistics, anticipated benefits, and potential risks of the ablation procedure including but not limited to -- bleed at the groin access site, chest pain, damage to nearby organs such as the diaphragm, lungs, or esophagus, need for a drainage tube, or prolonged hospitalization. I explained that the risk for stroke, heart attack, need for open chest surgery, or even death is very low but not zero. he  expressed understanding and wishes to proceed.   Secondary hypercoagulable state: continue eliquis HTN: atenolol refilled. He will continue a total daily dose of 50mg  until the ablation HLD      Follow-up 1 year  Medication Adjustments/Labs and Tests Ordered: Current medicines are reviewed at length  with the patient today.  Concerns regarding medicines are outlined above.  Orders Placed This Encounter  Procedures   Informed Consent Details: Physician/Practitioner Attestation; Transcribe to consent form and obtain patient signature   Initiate Pre-op Protocol   Void on call to EP Lab   Confirm CBC and BMP (or CMP) results within 7 days for inpatient and 30 days for outpatient:   Clip right and left femoral area PM before surgery   Clip right internal jugular area PM before surgery   Pre-admission testing diagnosis   EP STUDY   Insert peripheral IV   Meds ordered this encounter  Medications   0.9 %  sodium chloride infusion     Signed, Evan Small, MD  02/17/2024 7:10 AM    [facility]

## 2024-02-17 NOTE — Transfer of Care (Signed)
 Immediate Anesthesia Transfer of Care Note  Patient: Evan Farmer  Procedure(s) Performed: ATRIAL FIBRILLATION ABLATION  Patient Location: PACU  Anesthesia Type:General  Level of Consciousness: awake, alert , and oriented  Airway & Oxygen Therapy: Patient Spontanous Breathing and Patient connected to nasal cannula oxygen  Post-op Assessment: Report given to RN and Post -op Vital signs reviewed and stable  Post vital signs: Reviewed and stable  Last Vitals:  Vitals Value Taken Time  BP    Temp    Pulse    Resp    SpO2      Last Pain:  Vitals:   02/17/24 0608  TempSrc:   PainSc: 0-No pain         Complications: No notable events documented.

## 2024-02-18 ENCOUNTER — Encounter (HOSPITAL_COMMUNITY): Payer: Self-pay | Admitting: Cardiovascular Disease

## 2024-02-18 ENCOUNTER — Telehealth (HOSPITAL_COMMUNITY): Payer: Self-pay

## 2024-02-18 NOTE — Telephone Encounter (Signed)
 Spoke with patient to complete post procedure follow up call.  Patient reports no complications with groin sites.   Instructions reviewed with patient:  Remove large bandage at puncture site after 24 hours. It is normal to have bruising, tenderness and a pea or marble sized lump/knot at the groin site which can take up to three months to resolve.  Get help right away if you notice sudden swelling at the puncture site.  Check your puncture site every day for signs of infection: fever, redness, swelling, pus drainage, warmth, foul odor or excessive pain. If this occurs, please call the office at [phone], to speak with the nurse. Get help right away if your puncture site is bleeding and the bleeding does not stop after applying firm pressure to the area.  You may continue to have skipped beats/ atrial fibrillation during the first several months after your procedure.  It is very important not to miss any doses of your blood thinner Eliquis. Patient restarted taking this medication on yesterday, 02/17/24.   You will follow up with the Afib clinic on 03/18/24 and follow up with the APP on 05/18/24.   Patient verbalized understanding to all instructions provided.

## 2024-02-21 ENCOUNTER — Ambulatory Visit (HOSPITAL_COMMUNITY): Payer: 59 | Admitting: Physician Assistant

## 2024-02-21 ENCOUNTER — Other Ambulatory Visit: Payer: Self-pay | Admitting: Internal Medicine

## 2024-02-21 MED ORDER — ALPRAZOLAM 0.5 MG PO TABS: ORAL_TABLET | ORAL | 0 refills | Status: DC

## 2024-02-21 NOTE — Telephone Encounter (Signed)
 Copied from CRM [id number]. Topic: Clinical - Medication Refill >> Feb 21, 2024  2:21 PM Drema Balzarine wrote: Most Recent Primary Care Visit:  Provider: [name]  Department: [facility]  Visit Type: OFFICE VISIT  Date: 01/07/2024  Medication: ALPRAZolam   Has the patient contacted their pharmacy? Yes (Agent: If no, request that the patient contact the pharmacy for the refill. If patient does not wish to contact the pharmacy document the reason why and proceed with request.) (Agent: If yes, when and what did the pharmacy advise?)  Is this the correct pharmacy for this prescription? Yes If no, delete pharmacy and type the correct one.  This is the patient's preferred pharmacy:   [id number] [place], [place] - [address] AT [facility] [address] [place] [address] Phone: [phone] Fax: [phone]   Has the prescription been filled recently? Yes  Is the patient out of the medication? No  Has the patient been seen for an appointment in the last year OR does the patient have an upcoming appointment? Yes  Can we respond through MyChart? Yes  Agent: Please be advised that Rx refills may take up to 3 business days. We ask that you follow-up with your pharmacy.

## 2024-02-24 ENCOUNTER — Other Ambulatory Visit: Payer: Self-pay | Admitting: Internal Medicine

## 2024-02-24 NOTE — Telephone Encounter (Signed)
 Copied from CRM [id number]. Topic: Clinical - Medication Refill >> Feb 24, 2024 10:50 AM [place] J wrote: Most Recent Primary Care Visit:  Provider: Genia Kettering  Department: [facility]  Visit Type: OFFICE VISIT  Date: 01/07/2024  Medication: ALPRAZolam (XANAX) 0.5 MG tablet  Has the patient contacted their pharmacy? Yes (Agent: If no, request that the patient contact the pharmacy for the refill. If patient does not wish to contact the pharmacy document the reason why and proceed with request.) (Agent: If yes, when and what did the pharmacy advise?) Pharmacy is the one who called in. They stated that they can also take a verbal confirmation for medication refill.  Is this the correct pharmacy for this prescription? Yes If no, delete pharmacy and type the correct one.  This is the patient's preferred pharmacy:   [facility] / [address] [place], [place], [place] [address] Number: [phone] Opt. 2 Reference #: [id number]  Has the prescription been filled recently? No  Is the patient out of the medication? Yes  Has the patient been seen for an appointment in the last year OR does the patient have an upcoming appointment? Yes  Can we respond through MyChart? Yes  Agent: Please be advised that Rx refills may take up to 3 business days. We ask that you follow-up with your pharmacy.

## 2024-02-27 ENCOUNTER — Encounter: Payer: Self-pay | Admitting: Emergency Medicine

## 2024-03-03 ENCOUNTER — Other Ambulatory Visit: Payer: Self-pay | Admitting: Cardiovascular Disease

## 2024-03-03 DIAGNOSIS — I10 Essential (primary) hypertension: Secondary | ICD-10-CM

## 2024-03-03 NOTE — Telephone Encounter (Signed)
 The patient was called to verify how he has been taking his Losartan  medication.  The patient stated that his prescription says to take 1 tablet daily but that he takes 1/2 - 1 tablet daily depending on how his blood pressure is.

## 2024-03-18 ENCOUNTER — Ambulatory Visit (HOSPITAL_COMMUNITY)
Admission: RE | Admit: 2024-03-18 | Discharge: 2024-03-18 | Disposition: A | Discharge status: Home / Self Care | Payer: 59 | Source: Ambulatory Visit | Attending: Physician Assistant | Admitting: Physician Assistant

## 2024-03-18 ENCOUNTER — Encounter (HOSPITAL_COMMUNITY): Payer: Self-pay | Admitting: Physician Assistant

## 2024-03-18 VITALS — BP 120/84 | HR 53 | Ht 68.5 in | Wt 177.2 lb

## 2024-03-18 DIAGNOSIS — I483 Typical atrial flutter: Secondary | ICD-10-CM | POA: Diagnosis not present

## 2024-03-18 DIAGNOSIS — I4819 Other persistent atrial fibrillation: Secondary | ICD-10-CM

## 2024-03-18 NOTE — Progress Notes (Signed)
 Primary Care Physician: Genia Kettering, MD Referring Physician: self referred Primary EP: Dr. Jennett Model is a 73 y.o. male with a h/o afib, atrial flutter, HTN, HLD, TIA who presents for follow up in the [facility]. Patient had been maintained on flecainide  for several years and had an ablation at [facility] in 1992. He underwent repeat ablation with Dr Arlester Ladd for afib and atrial flutter 08/22/22. He was found to be in atypical atrial flutter 12/12/22 and underwent TEE/DCCV on 12/21/22. Unfortunately, he had quick return of his arrhythmia and flecainide  was resumed with repeat DCCV on 01/29/23. He is on Eliquis  for stroke prevention. He went into atrial flutter and underwent another DCCV on 06/10/23. Flecainide  increased at 08/16/23 visit which did not maintain SR. He was seen by Dr Arlester Ladd and underwent CTI ablation on 02/17/24. Pulmonary veins were electrically silent from prior ablation so PVI was not performed.   Patient returns for follow up for atrial fibrillation and atrial flutter. He reports that he has done well since the procedure. He has had a couple episodes of elevated heart rates but these lasted only for a few seconds. He denies chest pain or groin issues.   Today, he  denies symptoms of palpitations, chest pain, shortness of breath, orthopnea, PND, lower extremity edema, dizziness, presyncope, syncope, snoring, daytime somnolence, bleeding, or neurologic sequela. The patient is tolerating medications without difficulties and is otherwise without complaint today.    Past Medical History:  Diagnosis Date   Anxiety    Cataract    Depression    Diverticulosis    HTN (hypertension)    Hyperlipidemia    Paroxysmal A-fib (HCC)    Prostate cancer [facility])    2010 Dr Milon Aloe    [facility] spotted fever    age 71   S/P cardiac cath 02/2011   No obstructive disease. Normal LV function   Scoliosis    TIA (transient ischemic attack)    2012    Tubular adenoma of colon    Vitamin B 12 deficiency    Vitamin D  deficiency    Weight gain     Current Outpatient Medications  Medication Sig Dispense Refill   ALPRAZolam  (XANAX ) 0.5 MG tablet TAKE 1 TABLET TWICE A DAY  AS NEEDED FOR ANXIETY Strength: 0.5 mg 60 tablet 0   apixaban  (ELIQUIS ) 5 MG TABS tablet Take 1 tablet (5 mg total) by mouth 2 (two) times daily. 180 tablet 2   atenolol  (TENORMIN ) 25 MG tablet Take 1 tablet (25 mg total) by mouth daily. May take an extra tablet twice a day for breakthrough afib 200 tablet 2   Cholecalciferol (VITAMIN D3) 50 MCG (2000 UT) capsule Take 1 capsule (2,000 Units total) by mouth daily. 100 capsule 3   Cyanocobalamin  (B-12 SL) Place 2 tablets under the tongue daily.     ibuprofen (ADVIL) 200 MG tablet Take 200 mg by mouth every 6 (six) hours as needed for moderate pain (pain score 4-6).     losartan  (COZAAR ) 25 MG tablet TAKE 1 TABLET(25 MG) BY MOUTH DAILY 90 tablet 2   Multiple Vitamins-Minerals (MULTIVITAMIN WITH MINERALS) tablet Take 1 tablet by mouth daily.     sildenafil  (VIAGRA ) 100 MG tablet Take 1 tablet (100 mg total) by mouth daily as needed for erectile dysfunction. (Patient taking differently: Take 50-100 mg by mouth daily as needed for erectile dysfunction.) 12 tablet 5   No current facility-administered medications for this encounter.  ROS- All systems are reviewed and negative except as per the HPI above  Physical Exam: Vitals:   03/18/24 0925  BP: 120/84  Pulse: (!) 53  Weight: 80.4 kg  Height: 5' 8.5" (1.74 m)     Wt Readings from Last 3 Encounters:  03/18/24 80.4 kg  02/17/24 81.2 kg  01/07/24 82.1 kg    GEN: Well nourished, well developed in no acute distress CARDIAC: Regular rate and rhythm, no murmurs, rubs, gallops RESPIRATORY:  Clear to auscultation without rales, wheezing or rhonchi  ABDOMEN: Soft, non-tender, non-distended EXTREMITIES:  No edema; No deformity    EKG today demonstrates SB Vent. rate  53 BPM PR interval 168 ms QRS duration 92 ms QT/QTcB 412/386 ms   Echo 07/24/22  1. Left ventricular ejection fraction, by estimation, is 55 to 60%. The left ventricle has normal function. The left ventricle has no regional wall motion abnormalities. Diastolic function indeterminant due to Afib.   2. Right ventricular systolic function is normal. The right ventricular size is normal. There is normal pulmonary artery systolic pressure.   3. Left atrial size was moderately dilated.   4. Right atrial size was mildly dilated.   5. The mitral valve is grossly normal. Trivial mitral valve  regurgitation.   6. The aortic valve is tricuspid. There is mild calcification of the  aortic valve. There is mild thickening of the aortic valve. Aortic valve regurgitation is mild. Aortic valve sclerosis/calcification is present, without any evidence of aortic stenosis.   7. Aortic dilatation noted. There is mild dilatation of the aortic root, measuring 41 mm. There is mild dilatation of the ascending aorta, measuring 41 mm.   8. The inferior vena cava is normal in size with greater than 50%  respiratory variability, suggesting right atrial pressure of 3 mmHg.   Comparison(s): Compard to prior TEE report in 2017, there is no  significant change.    CHA2DS2-VASc Score = 5  The patient's score is based upon: CHF History: 0 HTN History: 1 Diabetes History: 0 Stroke History: 2 (TIA) Vascular Disease History: 1 (aortic atherosclerosis) Age Score: 1 Gender Score: 0       ASSESSMENT AND PLAN: Persistent Atrial Fibrillation/typical and atypical atrial flutter The patient's CHA2DS2-VASc score is 5, indicating a 7.2% annual risk of stroke.   S/p ablation 1992, 08/22/22, 02/17/24, now off flecainide  Patient appears to be maintaining SR Continue Eliquis  5 mg BID with no missed doses for 3 months post ablation.  Continue atenolol  25 mg daily  Secondary Hypercoagulable State (ICD10:  D68.69) The patient is  at significant risk for stroke/thromboembolism based upon his CHA2DS2-VASc Score of 5.  Continue Apixaban  (Eliquis ). No bleeding issues.   HTN Stable on current regimen   Follow up with Mertha Abrahams as scheduled.     Myrtha Ates PA-C Afib Clinic [facility] [address] [place], [place] [address] [phone]

## 2024-05-12 NOTE — Progress Notes (Signed)
 Cardiology Office Note Date:  05/12/2024  Patient ID:  Evan Farmer, Evan Farmer [birth year], MRN [id number] PCP:  Garald Karlynn GAILS, MD  Electrophysiologist: Dr. Kelsie >> Dr. Nancey    Chief Complaint:  post ablation  History of Present Illness: Evan Farmer is a 73 y.o. male with history of  HTN, HLD, TIA (2012),  AFib/flutter  EPS/ablation 02/17/24  Saw the AFib clinic 03/18/24, reported a few episodes of seconds of fast rates, no procedural concerns, no changes made  TODAY  He feels very well Still works a couple days a week and walks ~ most days with excellent exertional capacity No CP No SOB/DOE Post ablation had a couple fleeting fast HRs but that simmered down No dizzy spells, near syncope or syncope  Has one area/tooth/gum that bleeds with brushing,  no bleeding or signs of bleeding otherwise Advised to see his dentist   AFib/AAD hx Diagnosis goes as afar back as his chart, at least 2008 (probably the 80's by pts recollection) Pt reports an ablation in 1992 at [facility] Flecainide : already an established medicine when referred to Dr. Kelsie in 2013 >>> stopped with failure to maintain SR Sept 2023 AFlutter noted 07/18/22 08/22/22: PVI and CTI ablation Jan  2024 atypical AFlutter noted Flecainide  restarted Jan 2024 > Oct 2024 was uptitrated with ERAF post DCCV 02/17/24: EPS/ablation This was a very difficult case. I performed a transseptal and re-mapped the left atrium. I spent an extremely long time ablating a stubborn CTI and used aggressive wattage output with RF and then also used the PFA catheter to obtain block.  Flecainide  stopped post ablation   Past Medical History:  Diagnosis Date   Anxiety    Cataract    Depression    Diverticulosis    HTN (hypertension)    Hyperlipidemia    Paroxysmal A-fib (HCC)    Prostate cancer [facility])    2010 Dr Andra    [facility] spotted fever    age 89   S/P cardiac cath 02/2011   No obstructive disease. Normal LV  function   Scoliosis    TIA (transient ischemic attack)    2012   Tubular adenoma of colon    Vitamin B 12 deficiency    Vitamin D  deficiency    Weight gain     Past Surgical History:  Procedure Laterality Date   ATRIAL FIBRILLATION ABLATION N/A 08/22/2022   Procedure: ATRIAL FIBRILLATION ABLATION;  Surgeon: Nancey Eulas BRAVO, MD;  Location: [facility] INVASIVE CV LAB;  Service: Cardiovascular;  Laterality: N/A;   ATRIAL FIBRILLATION ABLATION N/A 02/17/2024   Procedure: ATRIAL FIBRILLATION ABLATION;  Surgeon: Nancey Eulas BRAVO, MD;  Location: [facility] INVASIVE CV LAB;  Service: Cardiovascular;  Laterality: N/A;   CARDIOVERSION N/A 07/10/2016   Procedure: CARDIOVERSION;  Surgeon: Redell GORMAN Shallow, MD;  Location: [facility] ENDOSCOPY;  Service: Cardiovascular;  Laterality: N/A;   CARDIOVERSION N/A 12/21/2022   Procedure: CARDIOVERSION;  Surgeon: Lonni Slain, MD;  Location: [facility] ENDOSCOPY;  Service: Cardiovascular;  Laterality: N/A;   CARDIOVERSION N/A 01/29/2023   Procedure: CARDIOVERSION;  Surgeon: Mona Vinie BROCKS, MD;  Location: [facility] ENDOSCOPY;  Service: Cardiovascular;  Laterality: N/A;   CARDIOVERSION N/A 06/10/2023   Procedure: CARDIOVERSION;  Surgeon: Francyne Headland, MD;  Location: [facility] INVASIVE CV LAB;  Service: Cardiovascular;  Laterality: N/A;   CATARACT EXTRACTION W/ INTRAOCULAR LENS  IMPLANT, BILATERAL     COLONOSCOPY  01-24-2005   TICS ONLY    COLONOSCOPY WITH PROPOFOL  N/A 12/19/2021  Procedure: COLONOSCOPY WITH PROPOFOL ;  Surgeon: Albertus Gordy HERO, MD;  Location: [facility];  Service: Gastroenterology;  Laterality: N/A;   ENDOSCOPIC MUCOSAL RESECTION N/A 12/19/2021   Procedure: ENDOSCOPIC MUCOSAL RESECTION;  Surgeon: Albertus Gordy HERO, MD;  Location: [facility] ENDOSCOPY;  Service: Gastroenterology;  Laterality: N/A;   HEMOSTASIS CLIP PLACEMENT  12/19/2021   Procedure: HEMOSTASIS CLIP PLACEMENT;  Surgeon: Albertus Gordy HERO, MD;  Location: [facility] ENDOSCOPY;  Service: Gastroenterology;;   HERNIA REPAIR     HERNIA REPAIR   1995 and 2003   twice   NASAL SEPTUM SURGERY     POLYPECTOMY  12/19/2021   Procedure: POLYPECTOMY;  Surgeon: Albertus Gordy HERO, MD;  Location: [facility] ENDOSCOPY;  Service: Gastroenterology;;   PROSTATECTOMY  11/2008   Robotic    SUBMUCOSAL LIFTING INJECTION  12/19/2021   Procedure: SUBMUCOSAL LIFTING INJECTION;  Surgeon: Albertus Gordy HERO, MD;  Location: [facility];  Service: Gastroenterology;;   SUBMUCOSAL TATTOO INJECTION  12/19/2021   Procedure: SUBMUCOSAL TATTOO INJECTION;  Surgeon: Albertus Gordy HERO, MD;  Location: [facility] ENDOSCOPY;  Service: Gastroenterology;;   TEE WITHOUT CARDIOVERSION N/A 07/10/2016   Procedure: TRANSESOPHAGEAL ECHOCARDIOGRAM (TEE);  Surgeon: Redell GORMAN Shallow, MD;  Location: [facility] ENDOSCOPY;  Service: Cardiovascular;  Laterality: N/A;   TEE WITHOUT CARDIOVERSION N/A 12/21/2022   Procedure: TRANSESOPHAGEAL ECHOCARDIOGRAM (TEE);  Surgeon: Lonni Slain, MD;  Location: [facility] ENDOSCOPY;  Service: Cardiovascular;  Laterality: N/A;    Current Outpatient Medications  Medication Sig Dispense Refill   ALPRAZolam  (XANAX ) 0.5 MG tablet TAKE 1 TABLET TWICE A DAY  AS NEEDED FOR ANXIETY Strength: 0.5 mg 60 tablet 0   apixaban  (ELIQUIS ) 5 MG TABS tablet Take 1 tablet (5 mg total) by mouth 2 (two) times daily. 180 tablet 2   atenolol  (TENORMIN ) 25 MG tablet Take 1 tablet (25 mg total) by mouth daily. May take an extra tablet twice a day for breakthrough afib 200 tablet 2   Cholecalciferol (VITAMIN D3) 50 MCG (2000 UT) capsule Take 1 capsule (2,000 Units total) by mouth daily. 100 capsule 3   Cyanocobalamin  (B-12 SL) Place 2 tablets under the tongue daily.     ibuprofen (ADVIL) 200 MG tablet Take 200 mg by mouth every 6 (six) hours as needed for moderate pain (pain score 4-6).     losartan  (COZAAR ) 25 MG tablet TAKE 1 TABLET(25 MG) BY MOUTH DAILY 90 tablet 2   Multiple Vitamins-Minerals (MULTIVITAMIN WITH MINERALS) tablet Take 1 tablet by mouth daily.     sildenafil  (VIAGRA ) 100 MG tablet Take 1 tablet  (100 mg total) by mouth daily as needed for erectile dysfunction. (Patient taking differently: Take 50-100 mg by mouth daily as needed for erectile dysfunction.) 12 tablet 5   No current facility-administered medications for this visit.    Allergies:   Procainamide hcl and Quinidine   Social History:  The patient  reports that he has never smoked. He has never used smokeless tobacco. He reports current alcohol use of about 8.0 standard drinks of alcohol per week. He reports that he does not use drugs.   Family History:  The patient's family history includes Arrhythmia in his mother; Atrial fibrillation in his mother; Cancer in his father; Colon cancer in his paternal uncle; Lung cancer in an other family member; Prostate cancer in an other family member.  ROS:  Please see the history of present illness.    All other systems are reviewed and otherwise negative.   PHYSICAL EXAM:  VS:  There were no vitals taken for  this visit. BMI: There is no height or weight on file to calculate BMI. Well nourished, well developed, in no acute distress HEENT: normocephalic, atraumatic Neck: no JVD, carotid bruits or masses Cardiac: RRR; no significant murmurs, no rubs, or gallops Lungs: CTA b/l, no wheezing, rhonchi or rales Abd: soft, nontender MS: no deformity or atrophy Ext: no edema Skin: warm and dry, no rash Neuro:  No gross deficits appreciated Psych: euthymic mood, full affect   EKG:  Done today and reviewed by myself shows  SB 53bpm  02/17/24: EPS/ablation .... This was a very difficult case. I performed a transseptal and re-mapped the left atrium. I spent an extremely long time ablating a stubborn CTI and used aggressive wattage output with RF and then also used the PFA catheter to obtain block.   01/27/24: Cardiac CT IMPRESSION: 1. There is normal pulmonary vein drainage into the left atrium with ostial measurements above. 2. There is no thrombus in the left atrial appendage. 3. The  esophagus runs in the left atrial midline and is not in proximity to any of the pulmonary vein ostia. 4. No PFO/ASD. 5. Normal coronary origin. Right dominance. 6. CAC score of 0.61 Which is 15 percentile for age-, race-, and sex-matched controls. 7. Aortic atherosclerosis. 8. Mild biatrial enlargement.     CONCLUSIONS:   1. Isthmus-dependent counter clockwise right atrial flutter.   2. Successful radiofrequency ablation of atrial flutter along the cavotricuspid isthmus with complete bidirectional isthmus block achieved.   3. No inducible arrhythmias following ablation.   4. No early apparent complications.      08/22/22: EPS/ablation CONCLUSIONS: 1. Successful PVI 2. Successful ablation/isolation of the CTI 3. Intracardiac echo reveals no significant effusion 4. No early apparent complications.   08/16/22: cardiac CT IMPRESSION: 1. There is normal pulmonary vein drainage into the left atrium with ostial measurements above.   2. There is no thrombus in the left atrial appendage.   3. The esophagus runs in the left atrial midline and is not in proximity to any of the pulmonary vein ostia.   4. No PFO/ASD.   5. Normal coronary origin. Right dominance.   6. CAC score of 1.2 Agatston units which is 17th percentile for age-, race-, and sex-matched controls. This suggests low risk for future cardiac events.  IMPRESSION: No significant extracardiac findings within the visualized chest. Severe thoracolumbar scoliosis   07/24/22; TTE 1. Left ventricular ejection fraction, by estimation, is 55 to 60%. The  left ventricle has normal function. The left ventricle has no regional  wall motion abnormalities. Diastolic function indeterminant due to Afib.   2. Right ventricular systolic function is normal. The right ventricular  size is normal. There is normal pulmonary artery systolic pressure.   3. Left atrial size was moderately dilated.   4. Right atrial size was mildly dilated.    5. The mitral valve is grossly normal. Trivial mitral valve  regurgitation.   6. The aortic valve is tricuspid. There is mild calcification of the  aortic valve. There is mild thickening of the aortic valve. Aortic valve  regurgitation is mild. Aortic valve sclerosis/calcification is present,  without any evidence of aortic  stenosis.   7. Aortic dilatation noted. There is mild dilatation of the aortic root,  measuring 41 mm. There is mild dilatation of the ascending aorta,  measuring 41 mm.   8. The inferior vena cava is normal in size with greater than 50%  respiratory variability, suggesting right atrial pressure of 3 mmHg.  Comparison(s): Compard to prior TEE report in 2017, there is no  significant change.   Recent Labs: 01/24/2024: BUN 23; Creatinine, Ser 1.08; Hemoglobin 16.0; Platelets 159; Potassium 4.8; Sodium 140  No results found for requested labs within last 365 days.   CrCl cannot be calculated (Patient's most recent lab result is older than the maximum 21 days allowed.).   Wt Readings from Last 3 Encounters:  03/18/24 177 lb 3.2 oz (80.4 kg)  02/17/24 179 lb (81.2 kg)  01/07/24 181 lb (82.1 kg)     Other studies reviewed: Additional studies/records reviewed today include: summarized above  ASSESSMENT AND PLAN:  Persistent AFib Typical AFlutter, now with an atypical flutter CHA2DS2Vasc is 4, on eliquis , appropriately dosed No symptoms of his AFib/flutter  Secondary hypercoagulable state   HTN Well controlled on low dose meds   Disposition: back in 78mo, sooner if needed  Current medicines are reviewed at length with the patient today.  The patient did not have any concerns regarding medicines.  Bonney Charlies Arthur, PA-C 05/12/2024 12:40 PM     [facility] [address] [place] [address] [phone] (office)  [phone] (fax)

## 2024-05-18 ENCOUNTER — Encounter: Payer: Self-pay | Admitting: Physician Assistant

## 2024-05-18 ENCOUNTER — Ambulatory Visit: Attending: Physician Assistant | Admitting: Physician Assistant

## 2024-05-18 VITALS — BP 114/68 | HR 53 | Ht 68.5 in | Wt 177.3 lb

## 2024-05-18 DIAGNOSIS — I10 Essential (primary) hypertension: Secondary | ICD-10-CM

## 2024-05-18 DIAGNOSIS — I483 Typical atrial flutter: Secondary | ICD-10-CM

## 2024-05-18 DIAGNOSIS — I4819 Other persistent atrial fibrillation: Secondary | ICD-10-CM | POA: Diagnosis not present

## 2024-05-18 DIAGNOSIS — D6869 Other thrombophilia: Secondary | ICD-10-CM | POA: Diagnosis not present

## 2024-05-18 NOTE — Patient Instructions (Signed)
 Medication Instructions:   Your physician recommends that you continue on your current medications as directed. Please refer to the Current Medication list given to you today.   *If you need a refill on your cardiac medications before your next appointment, please call your pharmacy*   Lab Work: NONE ORDERED  TODAY     If you have labs (blood work) drawn today and your tests are completely normal, you will receive your results only by: MyChart Message (if you have MyChart) OR A paper copy in the mail If you have any lab test that is abnormal or we need to change your treatment, we will call you to review the results.   Testing/Procedures: NONE ORDERED  TODAY     Follow-Up: At [facility], you and your health needs are our priority.  As part of our continuing mission to provide you with exceptional heart care, our providers are all part of one team.  This team includes your primary Cardiologist (physician) and Advanced Practice Providers or APPs (Physician Assistants and Nurse Practitioners) who all work together to provide you with the care you need, when you need it.  Your next appointment:    6 month(s)  Provider:    You may see Efraim Grange, MD or one of the following Advanced Practice Providers on your designated Care Team:   Mertha Abrahams, [place]   We recommend signing up for the patient portal called MyChart.  Sign up information is provided on this After Visit Summary.  MyChart is used to connect with patients for Virtual Visits (Telemedicine).  Patients are able to view lab/test results, encounter notes, upcoming appointments, etc.  Non-urgent messages can be sent to your provider as well.   To learn more about what you can do with MyChart, go to [email or web address].   Other Instructions

## 2024-06-09 ENCOUNTER — Other Ambulatory Visit: Payer: Self-pay | Admitting: Family Medicine

## 2024-06-09 ENCOUNTER — Other Ambulatory Visit (HOSPITAL_COMMUNITY): Payer: Self-pay | Admitting: Internal Medicine

## 2024-06-09 DIAGNOSIS — I10 Essential (primary) hypertension: Secondary | ICD-10-CM

## 2024-07-07 ENCOUNTER — Ambulatory Visit: Payer: 59 | Admitting: Internal Medicine

## 2024-07-07 ENCOUNTER — Encounter: Payer: Self-pay | Admitting: Internal Medicine

## 2024-07-07 VITALS — BP 130/76 | HR 60 | Temp 97.9°F | Ht 68.5 in | Wt 170.0 lb

## 2024-07-07 DIAGNOSIS — F419 Anxiety disorder, unspecified: Secondary | ICD-10-CM | POA: Diagnosis not present

## 2024-07-07 DIAGNOSIS — Z0000 Encounter for general adult medical examination without abnormal findings: Secondary | ICD-10-CM | POA: Diagnosis not present

## 2024-07-07 DIAGNOSIS — I480 Paroxysmal atrial fibrillation: Secondary | ICD-10-CM

## 2024-07-07 DIAGNOSIS — E538 Deficiency of other specified B group vitamins: Secondary | ICD-10-CM | POA: Diagnosis not present

## 2024-07-07 DIAGNOSIS — I10 Essential (primary) hypertension: Secondary | ICD-10-CM

## 2024-07-07 LAB — TSH: TSH: 0.69 u[IU]/mL (ref 0.35–5.50)

## 2024-07-07 LAB — CBC WITH DIFFERENTIAL/PLATELET
Basophils Absolute: 0 K/uL (ref 0.0–0.1)
Basophils Relative: 0.6 % (ref 0.0–3.0)
Eosinophils Absolute: 0 K/uL (ref 0.0–0.7)
Eosinophils Relative: 0.7 % (ref 0.0–5.0)
HCT: 46.8 % (ref 39.0–52.0)
Hemoglobin: 15.8 g/dL (ref 13.0–17.0)
Lymphocytes Relative: 19 % (ref 12.0–46.0)
Lymphs Abs: 0.7 K/uL (ref 0.7–4.0)
MCHC: 33.6 g/dL (ref 30.0–36.0)
MCV: 98.1 fl (ref 78.0–100.0)
Monocytes Absolute: 0.3 K/uL (ref 0.1–1.0)
Monocytes Relative: 7.8 % (ref 3.0–12.0)
Neutro Abs: 2.5 K/uL (ref 1.4–7.7)
Neutrophils Relative %: 71.9 % (ref 43.0–77.0)
Platelets: 141 K/uL — ABNORMAL LOW (ref 150.0–400.0)
RBC: 4.77 Mil/uL (ref 4.22–5.81)
RDW: 13.3 % (ref 11.5–15.5)
WBC: 3.5 K/uL — ABNORMAL LOW (ref 4.0–10.5)

## 2024-07-07 LAB — COMPREHENSIVE METABOLIC PANEL WITH GFR
ALT: 11 U/L (ref 0–53)
AST: 16 U/L (ref 0–37)
Albumin: 4.1 g/dL (ref 3.5–5.2)
Alkaline Phosphatase: 52 U/L (ref 39–117)
BUN: 21 mg/dL (ref 6–23)
CO2: 30 meq/L (ref 19–32)
Calcium: 8.5 mg/dL (ref 8.4–10.5)
Chloride: 101 meq/L (ref 96–112)
Creatinine, Ser: 1.07 mg/dL (ref 0.40–1.50)
GFR: 69.79 mL/min (ref >60.00)
Glucose, Bld: 93 mg/dL (ref 70–99)
Potassium: 4.3 meq/L (ref 3.5–5.1)
Sodium: 139 meq/L (ref 135–145)
Total Bilirubin: 1.2 mg/dL (ref 0.2–1.2)
Total Protein: 6.7 g/dL (ref 6.0–8.3)

## 2024-07-07 LAB — URINALYSIS
Bilirubin Urine: NEGATIVE
Ketones, ur: NEGATIVE
Leukocytes,Ua: NEGATIVE
Nitrite: NEGATIVE
Specific Gravity, Urine: 1.015 (ref 1.000–1.030)
Total Protein, Urine: NEGATIVE
Urine Glucose: NEGATIVE
Urobilinogen, UA: 0.2 (ref 0.0–1.0)
pH: 6 (ref 5.0–8.0)

## 2024-07-07 LAB — VITAMIN B12: Vitamin B-12: >1500 pg/mL — ABNORMAL HIGH (ref 211–911)

## 2024-07-07 LAB — LIPID PANEL
Cholesterol: 221 mg/dL — ABNORMAL HIGH (ref 0–200)
HDL: 49.6 mg/dL (ref >39.00)
LDL Cholesterol: 153 mg/dL — ABNORMAL HIGH (ref 0–99)
NonHDL: 171.39
Total CHOL/HDL Ratio: 4
Triglycerides: 94 mg/dL (ref 0.0–149.0)
VLDL: 18.8 mg/dL (ref 0.0–40.0)

## 2024-07-07 NOTE — Assessment & Plan Note (Signed)
Cont w/Atenolol, Losartan

## 2024-07-07 NOTE — Assessment & Plan Note (Signed)
 Chronic  Zoloft, prn Xanax  Potential benefits of a long term benzodiazepines  use as well as potential risks  and complications were explained to the patient and were aknowledged.

## 2024-07-07 NOTE — Assessment & Plan Note (Signed)
 On B12

## 2024-07-07 NOTE — Progress Notes (Signed)
 Subjective:  Patient ID: Evan Farmer, male    DOB: 1951-06-14  Age: 73 y.o. MRN: [id number]  CC: Medical Management of Chronic Issues (6 mnth f/u )   HPI Evan Farmer presents for A fib, B12 def, anxiety S/p ablation in March  Outpatient Medications Prior to Visit  Medication Sig Dispense Refill   ALPRAZolam  (XANAX ) 0.5 MG tablet TAKE 1 TABLET BY MOUTH TWICE A DAY AS NEEDED FOR ANXIETY 60 tablet 1   apixaban  (ELIQUIS ) 5 MG TABS tablet Take 1 tablet (5 mg total) by mouth 2 (two) times daily. 180 tablet 2   atenolol  (TENORMIN ) 25 MG tablet TAKE 1 TABLET BY MOUTH 2 TIMES DAILY. MAY TAKE AN EXTRA TABLET TWICE DAILY FOR BREAKTHROUGH AFIB 270 tablet 3   Cholecalciferol (VITAMIN D3) 50 MCG (2000 UT) capsule Take 1 capsule (2,000 Units total) by mouth daily. 100 capsule 3   Cyanocobalamin  (B-12 SL) Place 2 tablets under the tongue daily.     ibuprofen (ADVIL) 200 MG tablet Take 200 mg by mouth every 6 (six) hours as needed for moderate pain (pain score 4-6).     losartan  (COZAAR ) 25 MG tablet TAKE 1 TABLET(25 MG) BY MOUTH DAILY (Patient taking differently: 12.5 mg.) 90 tablet 2   Multiple Vitamins-Minerals (MULTIVITAMIN WITH MINERALS) tablet Take 1 tablet by mouth daily.     sildenafil  (VIAGRA ) 100 MG tablet Take 1 tablet (100 mg total) by mouth daily as needed for erectile dysfunction. (Patient taking differently: Take 50 mg by mouth daily as needed for erectile dysfunction.) 12 tablet 5   No facility-administered medications prior to visit.    ROS: Review of Systems  Constitutional:  Negative for appetite change, fatigue and unexpected weight change.  HENT:  Negative for congestion, nosebleeds, sneezing, sore throat and trouble swallowing.   Eyes:  Negative for itching and visual disturbance.  Respiratory:  Negative for cough.   Cardiovascular:  Negative for chest pain, palpitations and leg swelling.  Gastrointestinal:  Negative for abdominal distention, blood in stool, diarrhea and  nausea.  Genitourinary:  Negative for frequency and hematuria.  Musculoskeletal:  Negative for back pain, gait problem, joint swelling and neck pain.  Skin:  Negative for rash.  Neurological:  Negative for dizziness, tremors, speech difficulty and weakness.  Psychiatric/Behavioral:  Negative for agitation, dysphoric mood and sleep disturbance. The patient is not nervous/anxious.     Objective:  BP 130/76   Pulse 60   Temp 97.9 F (36.6 C) (Oral)   Ht 5' 8.5 (1.74 m)   Wt 170 lb (77.1 kg)   SpO2 94%   BMI 25.47 kg/m   BP Readings from Last 3 Encounters:  07/07/24 130/76  05/18/24 114/68  03/18/24 120/84    Wt Readings from Last 3 Encounters:  07/07/24 170 lb (77.1 kg)  05/18/24 177 lb 4.8 oz (80.4 kg)  03/18/24 177 lb 3.2 oz (80.4 kg)    Physical Exam Constitutional:      General: He is not in acute distress.    Appearance: He is well-developed.     Comments: NAD  Eyes:     Conjunctiva/sclera: Conjunctivae normal.     Pupils: Pupils are equal, round, and reactive to light.  Neck:     Thyroid : No thyromegaly.     Vascular: No JVD.  Cardiovascular:     Rate and Rhythm: Normal rate and regular rhythm.     Heart sounds: Normal heart sounds. No murmur heard.    No friction rub. No  gallop.  Pulmonary:     Effort: Pulmonary effort is normal. No respiratory distress.     Breath sounds: Normal breath sounds. No wheezing or rales.  Chest:     Chest wall: No tenderness.  Abdominal:     General: Bowel sounds are normal. There is no distension.     Palpations: Abdomen is soft. There is no mass.     Tenderness: There is no abdominal tenderness. There is no guarding or rebound.  Musculoskeletal:        General: No tenderness. Normal range of motion.     Cervical back: Normal range of motion.  Lymphadenopathy:     Cervical: No cervical adenopathy.  Skin:    General: Skin is warm and dry.     Findings: No rash.  Neurological:     Mental Status: He is alert and oriented  to person, place, and time.     Cranial Nerves: No cranial nerve deficit.     Motor: No abnormal muscle tone.     Coordination: Coordination normal.     Gait: Gait normal.     Deep Tendon Reflexes: Reflexes are normal and symmetric.  Psychiatric:        Behavior: Behavior normal.        Thought Content: Thought content normal.        Judgment: Judgment normal.     Lab Results  Component Value Date   WBC 4.1 01/24/2024   HGB 16.0 01/24/2024   HCT 47.5 01/24/2024   PLT 159 01/24/2024   GLUCOSE 90 01/24/2024   CHOL 197 07/03/2022   TRIG 132.0 07/03/2022   HDL 41.40 07/03/2022   LDLCALC 130 (H) 07/03/2022   ALT 10 07/03/2022   AST 14 07/03/2022   NA 140 01/24/2024   K 4.8 01/24/2024   CL 100 01/24/2024   CREATININE 1.08 01/24/2024   BUN 23 01/24/2024   CO2 26 01/24/2024   TSH 1.47 01/02/2022   PSA <0.1 06/20/2020   INR 0.95 02/12/2011   HGBA1C  01/20/2011    5.2 (NOTE)                                                                       According to the ADA Clinical Practice Recommendations for 2011, when HbA1c is used as a screening test:   >=6.5%   Diagnostic of Diabetes Mellitus           (if abnormal result  is confirmed)  5.7-6.4%   Increased risk of developing Diabetes Mellitus  References:Diagnosis and Classification of Diabetes Mellitus,Diabetes Care,2011,34(Suppl 1):S62-S69 and Standards of Medical Care in         Diabetes - 2011,Diabetes Care,2011,34  (Suppl 1):S11-S61.    No results found.  Assessment & Plan:   Problem List Items Addressed This Visit     Anxiety disorder   Chronic  Zoloft , prn Xanax   Potential benefits of a long term benzodiazepines  use as well as potential risks  and complications were explained to the patient and were aknowledged.      Atrial fibrillation [facility])   S/p another ablation in March - in NSR  Cont on Eliquis  and Atenolol       B12 deficiency - Primary   On B12  Relevant Orders   Vitamin B12   HTN (hypertension)    Cont w/Atenolol , Losartan       Well adult exam   Relevant Orders   TSH   Urinalysis   CBC with Differential/Platelet   Lipid panel   Comprehensive metabolic panel with GFR   Vitamin B12      No orders of the defined types were placed in this encounter.     Follow-up: Return in about 6 months (around 01/07/2025) for a follow-up visit.  Marolyn Noel, MD

## 2024-07-07 NOTE — Assessment & Plan Note (Signed)
 S/p another ablation in March - in NSR  Cont on Eliquis  and Atenolol 

## 2024-07-08 ENCOUNTER — Ambulatory Visit: Payer: Self-pay | Admitting: Internal Medicine

## 2024-08-23 ENCOUNTER — Telehealth: Payer: Self-pay | Admitting: Cardiology

## 2024-08-23 ENCOUNTER — Telehealth: Payer: Self-pay | Admitting: Cardiothoracic Surgery

## 2024-08-23 NOTE — Telephone Encounter (Signed)
 Called back again. Traveling out of town, noted he is in AF with HR up to max of 120s, some palpitations, no dizziness.  Took regular atenolol  dose 25 this am at 7 Took addition atenolol  dose 50 this afternoon at 2pm BP afterwards 130s systolic HR slightly down to 100-110s.  Requesting guidance on anything else to do tonight. Advised he is ok to take another 25 mg atenolol  tonight, but would not exceed that dose.  Andee Flatten MD Cards on call. 8pm

## 2024-08-23 NOTE — Telephone Encounter (Signed)
 Received page from the answering service that patient had called with heart rate in the 110s.  Not dizzy, felt fine.  I attempted to call patient back x 2.  Patient did not answer.  Left voicemail with instructions to call back answering service.

## 2024-09-01 ENCOUNTER — Telehealth: Payer: Self-pay | Admitting: Cardiovascular Disease

## 2024-09-01 NOTE — Telephone Encounter (Signed)
 Spoke with pt regarding his heart rate. Pt stated his heart rate since having an ablation has been in the 60s but recently his heart rate has been elevated, 90-122. The pt is using his apple watch to detect heart rate. The apple watch has not alerted to any arrhythmias. The pt denies any chest pain, shortness of breath, lightheadedness or dizziness. Pt was asked if he is drinking an adequate amount of fluids. The pt stated he probably is not drinking enough. Pt's blood pressure has also been elevated. Pt stated it is usually 120s/70s, however it has been in the 140s/90s. The pt blood pressure while on the phone was 144/94. Pt stated he doubled his atenolol  and losartan  which he stated he had once been advised to do. Pt was told to hydrate and given ED precautions. Pt verbalized understanding. All questions if any were answered.

## 2024-09-01 NOTE — Telephone Encounter (Signed)
 Per Patients MyChart Message Requesting an Appt: faster than usual heartbeat, 80-105   STAT if HR is under 50 or over 120  (normal HR is 60-100 beats per minute)  What is your heart rate? 90-100 at rest but sometimes goes to 110-115 after slight exertion  Do you have a log of your heart rate readings (document readings)? My normal since ablation is usually around 60. Started doubling atenolol  twice daily as I did last fall during afib.Blood pressure up at last reading with 95 bpm but does not show irregular rhythm   Do you have any other symptoms?

## 2024-09-02 NOTE — Telephone Encounter (Signed)
 Pt advised of Renee's response and agreeable to plan.

## 2024-09-04 NOTE — Telephone Encounter (Signed)
 Called transferred from operator from pt who is extremely anxious regarding his HR and BP.  He is reporting an ablation appr 6 months ago and everything being great until the last 1 or 1.5 weeks.  HR was been elevated and he reports currently it is 125 bpm.  HR has been running around 60-70, now around 90 most times but will increase to 105 - 115 bpm with activity.  His heart monitor does not show At Fib as in the past, just a fast heart rate.  His last BP was 151/102 which is more elevated than his normal.  BP had been 110-120/?,  More recently has been running around 140/90-95 especially when HR is elevated.  Pt is ordered to take Atenolol  25 BID (may take extra tablet twice a day as needed for breakthrough At Fib) and Losartan  25 mg daily according to his chart. Based on his report he has been not been taking his medications completely as instructed.  Today he took Atenolol  25 mg (2) tablets around 9 or 10 am and then another around 12 or 1 pm.  He took Losartan  around 2 pm.  He took Alprazolam  a little while ago.  He is talking extremely fast and repeating information before this RN can ask follow up questions.   Pt is frequently taking his VS.  Advised he needs to stop taking VS as frequently as his his causing more anxiety which is leading to his HR and BP to become higher.  Advised to try to relax, take his medications as prescribed, and avoid increased NA+ intake.  Perimeters for BP and HR given as to when he should seek further assistance.  Advised he can also call provider on call over the weekend for further instructions as necessary.  Pt states understanding and admits he is very anxious.  He will try to relax and take medications as ordered.

## 2024-09-04 NOTE — Telephone Encounter (Signed)
 Pt states he's been doing everything he was instructed to but his HR is 125 right now.

## 2024-09-07 NOTE — Telephone Encounter (Signed)
 Patient states rates have improved with medication adjustment but continues to be in afib. Appt made for tomorrow. Pt in agreement.

## 2024-09-07 NOTE — Telephone Encounter (Signed)
 Attempted to contact pt. No voicemail available. Will try again later.

## 2024-09-08 ENCOUNTER — Other Ambulatory Visit: Payer: Self-pay | Admitting: Cardiovascular Disease

## 2024-09-08 ENCOUNTER — Ambulatory Visit (HOSPITAL_COMMUNITY)
Admission: RE | Admit: 2024-09-08 | Discharge: 2024-09-08 | Disposition: A | Discharge status: Home / Self Care | Source: Ambulatory Visit | Attending: Physician Assistant | Admitting: Physician Assistant

## 2024-09-08 VITALS — BP 130/88 | HR 107 | Ht 68.5 in | Wt 176.4 lb

## 2024-09-08 DIAGNOSIS — D6869 Other thrombophilia: Secondary | ICD-10-CM

## 2024-09-08 DIAGNOSIS — I484 Atypical atrial flutter: Secondary | ICD-10-CM | POA: Diagnosis not present

## 2024-09-08 DIAGNOSIS — I4891 Unspecified atrial fibrillation: Secondary | ICD-10-CM

## 2024-09-08 MED ORDER — FLECAINIDE ACETATE 100 MG PO TABS: 100.0000 mg | ORAL_TABLET | Freq: Two times a day (BID) | ORAL | 2 refills | Status: DC

## 2024-09-08 NOTE — H&P (View-Only) (Signed)
 Primary Care Physician: Garald Karlynn GAILS, MD Referring Physician: self referred Primary EP: Dr. Nancey Lynwood JONELLE Evan Farmer is a 73 y.o. male with a h/o afib, atrial flutter, HTN, HLD, TIA who presents for follow up in the [facility]. Patient had been maintained on flecainide  for several years and had an ablation at [facility] in 1992. He underwent repeat ablation with Dr Nancey for afib and atrial flutter 08/22/22. He was found to be in atypical atrial flutter 12/12/22 and underwent TEE/DCCV on 12/21/22. Unfortunately, he had quick return of his arrhythmia and flecainide  was resumed with repeat DCCV on 01/29/23. He is on Eliquis  for stroke prevention. He went into atrial flutter and underwent another DCCV on 06/10/23. Flecainide  increased at 08/16/23 visit which did not maintain SR. He was seen by Dr Nancey and underwent CTI ablation on 02/17/24. Pulmonary veins were electrically silent from prior ablation so PVI was not performed.   Patient returns for follow up for atrial fibrillation and atrial flutter. Over the past 2-3 weeks he has noticed intermittent elevated heart rates. He is in atrial flutter today. There were no specific triggers that he could identify. No bleeding issues on anticoagulation.   Today, he  denies symptoms of palpitations, chest pain, shortness of breath, orthopnea, PND, lower extremity edema, dizziness, presyncope, syncope, snoring, daytime somnolence, bleeding, or neurologic sequela. The patient is tolerating medications without difficulties and is otherwise without complaint today.    Past Medical History:  Diagnosis Date   Anxiety    Cataract    Depression    Diverticulosis    HTN (hypertension)    Hyperlipidemia    Paroxysmal A-fib (HCC)    Prostate cancer [facility])    2010 Dr Andra    [facility] spotted fever    age 20   S/P cardiac cath 02/2011   No obstructive disease. Normal LV function   Scoliosis    TIA (transient ischemic attack)     2012   Tubular adenoma of colon    Vitamin B 12 deficiency    Vitamin D  deficiency    Weight gain     Current Outpatient Medications  Medication Sig Dispense Refill   Cyanocobalamin  (B-12 SL) Place 2 tablets under the tongue daily.     ibuprofen (ADVIL) 200 MG tablet Take 200 mg by mouth every 6 (six) hours as needed for moderate pain (pain score 4-6). (Patient taking differently: Take 200 mg by mouth as needed for moderate pain (pain score 4-6).)     losartan  (COZAAR ) 25 MG tablet TAKE 1 TABLET(25 MG) BY MOUTH DAILY (Patient taking differently: Take 12.5 mg by mouth every morning.) 90 tablet 2   Multiple Vitamins-Minerals (MULTIVITAMIN WITH MINERALS) tablet Take 1 tablet by mouth daily.     sildenafil  (VIAGRA ) 100 MG tablet Take 1 tablet (100 mg total) by mouth daily as needed for erectile dysfunction. (Patient taking differently: Take 50 mg by mouth daily as needed for erectile dysfunction.) 12 tablet 5   ALPRAZolam  (XANAX ) 0.5 MG tablet TAKE 1 TABLET BY MOUTH TWICE A DAY AS NEEDED FOR ANXIETY 60 tablet 1   apixaban  (ELIQUIS ) 5 MG TABS tablet Take 1 tablet (5 mg total) by mouth 2 (two) times daily. 180 tablet 2   atenolol  (TENORMIN ) 25 MG tablet TAKE 1 TABLET BY MOUTH 2 TIMES DAILY. MAY TAKE AN EXTRA TABLET TWICE DAILY FOR BREAKTHROUGH AFIB 270 tablet 3   Cholecalciferol (VITAMIN D3) 50 MCG (2000 UT) capsule Take 1  capsule (2,000 Units total) by mouth daily. 100 capsule 3   No current facility-administered medications for this encounter.    ROS- All systems are reviewed and negative except as per the HPI above  Physical Exam: Vitals:   09/08/24 0824  Weight: 80 kg  Height: 5' 8.5 (1.74 m)    Wt Readings from Last 3 Encounters:  09/08/24 80 kg  07/07/24 77.1 kg  05/18/24 80.4 kg    GEN: Well nourished, well developed in no acute distress CARDIAC: Irregularly irregular rate and rhythm, no murmurs, rubs, gallops RESPIRATORY:  Clear to auscultation without rales, wheezing or  rhonchi  ABDOMEN: Soft, non-tender, non-distended EXTREMITIES:  No edema; No deformity    EKG today demonstrates Atypical atrial flutter with variable block Vent. rate 107 BPM PR interval * ms QRS duration 84 ms QT/QTcB 332/443 ms   Echo 07/24/22  1. Left ventricular ejection fraction, by estimation, is 55 to 60%. The left ventricle has normal function. The left ventricle has no regional wall motion abnormalities. Diastolic function indeterminant due to Afib.   2. Right ventricular systolic function is normal. The right ventricular size is normal. There is normal pulmonary artery systolic pressure.   3. Left atrial size was moderately dilated.   4. Right atrial size was mildly dilated.   5. The mitral valve is grossly normal. Trivial mitral valve  regurgitation.   6. The aortic valve is tricuspid. There is mild calcification of the  aortic valve. There is mild thickening of the aortic valve. Aortic valve regurgitation is mild. Aortic valve sclerosis/calcification is present, without any evidence of aortic stenosis.   7. Aortic dilatation noted. There is mild dilatation of the aortic root, measuring 41 mm. There is mild dilatation of the ascending aorta, measuring 41 mm.   8. The inferior vena cava is normal in size with greater than 50%  respiratory variability, suggesting right atrial pressure of 3 mmHg.   Comparison(s): Compard to prior TEE report in 2017, there is no  significant change.    CHA2DS2-VASc Score = 5  The patient's score is based upon: CHF History: 0 HTN History: 1 Diabetes History: 0 Stroke History: 2 (TIA) Vascular Disease History: 1 (aortic atherosclerosis) Age Score: 1 Gender Score: 0       ASSESSMENT AND PLAN: Persistent Atrial Fibrillation/typical and atypical atrial flutter (ICD10:  I48.19) The patient's CHA2DS2-VASc score is 5, indicating a 7.2% annual risk of stroke.   S/p ablation 1992, 08/22/22, and 02/17/24 We discussed rhythm control options  today including flecainide , dofetilide, and amiodarone. He previously failed flecainide  prior to ablation. He would like to try flecainide  one more time. Start flecainide  100 mg BID and return for ECG next week. If he does not chemically convert, will plan for DCCV. If he fails flecainide  again, then he would be agreeable to dofetilide admission.  Continue atenolol  50 mg BID for now. Will reduce back to 25 mg BID once he is back in SR. Continue Eliquis  5 mg BID  Secondary Hypercoagulable State (ICD10:  D68.69) The patient is at significant risk for stroke/thromboembolism based upon his CHA2DS2-VASc Score of 5.  Continue Apixaban  (Eliquis ). No bleeding issues.   HTN Stable on current regimen   Follow up in the AF clinic next week for ECG.    Daril Kicks PA-C Af[facility] [address] [place], [place] [address] [phone]

## 2024-09-08 NOTE — Progress Notes (Signed)
 Primary Care Physician: Evan Karlynn GAILS, MD Referring Physician: self referred Primary EP: Dr. Nancey Lynwood JONELLE Evan Farmer is a 73 y.o. male with a h/o afib, atrial flutter, HTN, HLD, TIA who presents for follow up in the [facility]. Patient had been maintained on flecainide  for several years and had an ablation at [facility] in 1992. He underwent repeat ablation with Dr Nancey for afib and atrial flutter 08/22/22. He was found to be in atypical atrial flutter 12/12/22 and underwent TEE/DCCV on 12/21/22. Unfortunately, he had quick return of his arrhythmia and flecainide  was resumed with repeat DCCV on 01/29/23. He is on Eliquis  for stroke prevention. He went into atrial flutter and underwent another DCCV on 06/10/23. Flecainide  increased at 08/16/23 visit which did not maintain SR. He was seen by Dr Nancey and underwent CTI ablation on 02/17/24. Pulmonary veins were electrically silent from prior ablation so PVI was not performed.   Patient returns for follow up for atrial fibrillation and atrial flutter. Over the past 2-3 weeks he has noticed intermittent elevated heart rates. He is in atrial flutter today. There were no specific triggers that he could identify. No bleeding issues on anticoagulation.   Today, he  denies symptoms of palpitations, chest pain, shortness of breath, orthopnea, PND, lower extremity edema, dizziness, presyncope, syncope, snoring, daytime somnolence, bleeding, or neurologic sequela. The patient is tolerating medications without difficulties and is otherwise without complaint today.    Past Medical History:  Diagnosis Date   Anxiety    Cataract    Depression    Diverticulosis    HTN (hypertension)    Hyperlipidemia    Paroxysmal A-fib (HCC)    Prostate cancer [facility])    2010 Dr Andra    [facility] spotted fever    age 20   S/P cardiac cath 02/2011   No obstructive disease. Normal LV function   Scoliosis    TIA (transient ischemic attack)     2012   Tubular adenoma of colon    Vitamin B 12 deficiency    Vitamin D  deficiency    Weight gain     Current Outpatient Medications  Medication Sig Dispense Refill   Cyanocobalamin  (B-12 SL) Place 2 tablets under the tongue daily.     ibuprofen (ADVIL) 200 MG tablet Take 200 mg by mouth every 6 (six) hours as needed for moderate pain (pain score 4-6). (Patient taking differently: Take 200 mg by mouth as needed for moderate pain (pain score 4-6).)     losartan  (COZAAR ) 25 MG tablet TAKE 1 TABLET(25 MG) BY MOUTH DAILY (Patient taking differently: Take 12.5 mg by mouth every morning.) 90 tablet 2   Multiple Vitamins-Minerals (MULTIVITAMIN WITH MINERALS) tablet Take 1 tablet by mouth daily.     sildenafil  (VIAGRA ) 100 MG tablet Take 1 tablet (100 mg total) by mouth daily as needed for erectile dysfunction. (Patient taking differently: Take 50 mg by mouth daily as needed for erectile dysfunction.) 12 tablet 5   ALPRAZolam  (XANAX ) 0.5 MG tablet TAKE 1 TABLET BY MOUTH TWICE A DAY AS NEEDED FOR ANXIETY 60 tablet 1   apixaban  (ELIQUIS ) 5 MG TABS tablet Take 1 tablet (5 mg total) by mouth 2 (two) times daily. 180 tablet 2   atenolol  (TENORMIN ) 25 MG tablet TAKE 1 TABLET BY MOUTH 2 TIMES DAILY. MAY TAKE AN EXTRA TABLET TWICE DAILY FOR BREAKTHROUGH AFIB 270 tablet 3   Cholecalciferol (VITAMIN D3) 50 MCG (2000 UT) capsule Take 1  capsule (2,000 Units total) by mouth daily. 100 capsule 3   No current facility-administered medications for this encounter.    ROS- All systems are reviewed and negative except as per the HPI above  Physical Exam: Vitals:   09/08/24 0824  Weight: 80 kg  Height: 5' 8.5 (1.74 m)    Wt Readings from Last 3 Encounters:  09/08/24 80 kg  07/07/24 77.1 kg  05/18/24 80.4 kg    GEN: Well nourished, well developed in no acute distress CARDIAC: Irregularly irregular rate and rhythm, no murmurs, rubs, gallops RESPIRATORY:  Clear to auscultation without rales, wheezing or  rhonchi  ABDOMEN: Soft, non-tender, non-distended EXTREMITIES:  No edema; No deformity    EKG today demonstrates Atypical atrial flutter with variable block Vent. rate 107 BPM PR interval * ms QRS duration 84 ms QT/QTcB 332/443 ms   Echo 07/24/22  1. Left ventricular ejection fraction, by estimation, is 55 to 60%. The left ventricle has normal function. The left ventricle has no regional wall motion abnormalities. Diastolic function indeterminant due to Afib.   2. Right ventricular systolic function is normal. The right ventricular size is normal. There is normal pulmonary artery systolic pressure.   3. Left atrial size was moderately dilated.   4. Right atrial size was mildly dilated.   5. The mitral valve is grossly normal. Trivial mitral valve  regurgitation.   6. The aortic valve is tricuspid. There is mild calcification of the  aortic valve. There is mild thickening of the aortic valve. Aortic valve regurgitation is mild. Aortic valve sclerosis/calcification is present, without any evidence of aortic stenosis.   7. Aortic dilatation noted. There is mild dilatation of the aortic root, measuring 41 mm. There is mild dilatation of the ascending aorta, measuring 41 mm.   8. The inferior vena cava is normal in size with greater than 50%  respiratory variability, suggesting right atrial pressure of 3 mmHg.   Comparison(s): Compard to prior TEE report in 2017, there is no  significant change.    CHA2DS2-VASc Score = 5  The patient's score is based upon: CHF History: 0 HTN History: 1 Diabetes History: 0 Stroke History: 2 (TIA) Vascular Disease History: 1 (aortic atherosclerosis) Age Score: 1 Gender Score: 0       ASSESSMENT AND PLAN: Persistent Atrial Fibrillation/typical and atypical atrial flutter (ICD10:  I48.19) The patient's CHA2DS2-VASc score is 5, indicating a 7.2% annual risk of stroke.   S/p ablation 1992, 08/22/22, and 02/17/24 We discussed rhythm control options  today including flecainide , dofetilide, and amiodarone. He previously failed flecainide  prior to ablation. He would like to try flecainide  one more time. Start flecainide  100 mg BID and return for ECG next week. If he does not chemically convert, will plan for DCCV. If he fails flecainide  again, then he would be agreeable to dofetilide admission.  Continue atenolol  50 mg BID for now. Will reduce back to 25 mg BID once he is back in SR. Continue Eliquis  5 mg BID  Secondary Hypercoagulable State (ICD10:  D68.69) The patient is at significant risk for stroke/thromboembolism based upon his CHA2DS2-VASc Score of 5.  Continue Apixaban  (Eliquis ). No bleeding issues.   HTN Stable on current regimen   Follow up in the AF clinic next week for ECG.    Daril Kicks PA-C Af[facility] [address] [place], [place] [address] [phone]

## 2024-09-08 NOTE — Patient Instructions (Addendum)
Start Flecainide 100mg twice a day 

## 2024-09-15 ENCOUNTER — Ambulatory Visit (HOSPITAL_COMMUNITY): Payer: Self-pay | Admitting: Physician Assistant

## 2024-09-15 ENCOUNTER — Ambulatory Visit (HOSPITAL_COMMUNITY)
Admission: RE | Admit: 2024-09-15 | Discharge: 2024-09-15 | Disposition: A | Discharge status: Home / Self Care | Source: Ambulatory Visit | Attending: Physician Assistant | Admitting: Physician Assistant

## 2024-09-15 VITALS — HR 100

## 2024-09-15 DIAGNOSIS — I4892 Unspecified atrial flutter: Secondary | ICD-10-CM

## 2024-09-15 DIAGNOSIS — I4891 Unspecified atrial fibrillation: Secondary | ICD-10-CM

## 2024-09-15 LAB — BASIC METABOLIC PANEL WITH GFR
BUN/Creatinine Ratio: 16 (ref 10–24)
BUN: 18 mg/dL (ref 8–27)
CO2: 32 mmol/L — ABNORMAL HIGH (ref 20–29)
Calcium: 9.2 mg/dL (ref 8.6–10.2)
Chloride: 101 mmol/L (ref 96–106)
Creatinine, Ser: 1.16 mg/dL (ref 0.76–1.27)
Glucose: 92 mg/dL (ref 70–99)
Potassium: 4.5 mmol/L (ref 3.5–5.2)
Sodium: 138 mmol/L (ref 134–144)
eGFR: 67 mL/min/1.73 (ref >59)

## 2024-09-15 LAB — CBC
Hematocrit: 48 % (ref 37.5–51.0)
Hemoglobin: 15.9 g/dL (ref 13.0–17.7)
MCH: 32.9 pg (ref 26.6–33.0)
MCHC: 33.1 g/dL (ref 31.5–35.7)
MCV: 99 fL — ABNORMAL HIGH (ref 79–97)
Platelets: 141 x10E3/uL — ABNORMAL LOW (ref 150–450)
RBC: 4.84 x10E6/uL (ref 4.14–5.80)
RDW: 13.2 % (ref 11.6–15.4)
WBC: 4.2 x10E3/uL (ref 3.4–10.8)

## 2024-09-15 NOTE — Patient Instructions (Signed)
 Cardioversion scheduled for: Friday November 7th   - Arrive at the [employer] [facility] ([address]  and check in with ADMITTING at 7:15AM   - Do not eat or drink anything after midnight the night prior to your procedure.   - Take all your morning medication (except diabetic medications) with a sip of water prior to arrival.  - Do NOT miss any doses of your blood thinner - if you should miss a dose or take a dose more than 4 hours late -- please notify our office immediately.  - You will not be able to drive home after your procedure. Please ensure you have a responsible adult to drive you home. You will need someone with you for 24 hours post procedure.     - Expect to be in the procedural area approximately 2 hours.   - If you feel as if you go back into normal rhythm prior to scheduled cardioversion, please notify our office immediately.   If your procedure is canceled in the cardioversion suite you will be charged a cancellation fee.

## 2024-09-15 NOTE — Progress Notes (Signed)
 Patient returns for ECG after resuming flecainide . ECG shows:  Atypical atrial flutter with predominantly 2:1 block Vent. rate 100 BPM PR interval 276 ms QRS duration 110 ms QT/QTcB 374/482 ms  Patient remains in atrial flutter, will arrange for DCCV. Check bmet/cbc today.  Follow up in the AF clinic post DCCV.    Informed Consent   Shared Decision Making/Informed Consent The risks (stroke, cardiac arrhythmias rarely resulting in the need for a temporary or permanent pacemaker, skin irritation or burns and complications associated with conscious sedation including aspiration, arrhythmia, respiratory failure and death), benefits (restoration of normal sinus rhythm) and alternatives of a direct current cardioversion were explained in detail to Evan Farmer and he agrees to proceed.

## 2024-09-15 NOTE — Addendum Note (Signed)
 Encounter addended by: Franchot Glade RAMAN, RN on: 09/15/2024 9:34 AM  Actions taken: Order list changed, Diagnosis association updated

## 2024-09-17 NOTE — Progress Notes (Signed)
 Spoke to patient and instructed them to come at 0700  and to be NPO after 0000.     Confirmed that patient will have a ride home and someone to stay with them for 24 hours after the procedure.   Medications reviewed.  Confirmed blood thinner.  Confirmed no breaks in taking blood thinner for 3+ weeks prior to procedure.

## 2024-09-17 NOTE — Progress Notes (Signed)
 Attempted to reach patient, unidentified voicemail. Left message for return call to review instructions for appointment on 09/18/2024.

## 2024-09-18 ENCOUNTER — Ambulatory Visit (HOSPITAL_COMMUNITY): Admitting: Anesthesiology

## 2024-09-18 ENCOUNTER — Other Ambulatory Visit: Payer: Self-pay

## 2024-09-18 ENCOUNTER — Ambulatory Visit (HOSPITAL_COMMUNITY)
Admission: RE | Admit: 2024-09-18 | Discharge: 2024-09-18 | Disposition: A | Discharge status: Home / Self Care | Attending: Internal Medicine | Admitting: Internal Medicine

## 2024-09-18 ENCOUNTER — Encounter (HOSPITAL_COMMUNITY)
Admission: RE | Disposition: A | Discharge status: Home / Self Care | Payer: Self-pay | Source: Home / Self Care | Attending: Internal Medicine

## 2024-09-18 DIAGNOSIS — I4819 Other persistent atrial fibrillation: Secondary | ICD-10-CM | POA: Diagnosis present

## 2024-09-18 DIAGNOSIS — Z79899 Other long term (current) drug therapy: Secondary | ICD-10-CM | POA: Insufficient documentation

## 2024-09-18 DIAGNOSIS — I484 Atypical atrial flutter: Secondary | ICD-10-CM | POA: Diagnosis not present

## 2024-09-18 DIAGNOSIS — D6869 Other thrombophilia: Secondary | ICD-10-CM | POA: Diagnosis not present

## 2024-09-18 DIAGNOSIS — I4891 Unspecified atrial fibrillation: Secondary | ICD-10-CM | POA: Diagnosis not present

## 2024-09-18 DIAGNOSIS — E785 Hyperlipidemia, unspecified: Secondary | ICD-10-CM | POA: Diagnosis not present

## 2024-09-18 DIAGNOSIS — I10 Essential (primary) hypertension: Secondary | ICD-10-CM | POA: Diagnosis not present

## 2024-09-18 DIAGNOSIS — Z7901 Long term (current) use of anticoagulants: Secondary | ICD-10-CM | POA: Diagnosis not present

## 2024-09-18 DIAGNOSIS — I483 Typical atrial flutter: Secondary | ICD-10-CM | POA: Insufficient documentation

## 2024-09-18 DIAGNOSIS — F418 Other specified anxiety disorders: Secondary | ICD-10-CM | POA: Diagnosis not present

## 2024-09-18 DIAGNOSIS — I4892 Unspecified atrial flutter: Secondary | ICD-10-CM

## 2024-09-18 DIAGNOSIS — Z8673 Personal history of transient ischemic attack (TIA), and cerebral infarction without residual deficits: Secondary | ICD-10-CM | POA: Insufficient documentation

## 2024-09-18 HISTORY — PX: CARDIOVERSION: EP1203

## 2024-09-18 SURGERY — CARDIOVERSION (CATH LAB)
Anesthesia: General

## 2024-09-18 MED ORDER — SODIUM CHLORIDE 0.9 % IV SOLN: INTRAVENOUS | Status: DC

## 2024-09-18 MED ORDER — LIDOCAINE 2% (20 MG/ML) 5 ML SYRINGE
INTRAMUSCULAR | Status: DC | PRN
Start: 2024-09-18 — End: 2024-09-18
  Administered 2024-09-18: 60 mg via INTRAVENOUS

## 2024-09-18 MED ORDER — PROPOFOL 10 MG/ML IV BOLUS
INTRAVENOUS | Status: DC | PRN
Start: 2024-09-18 — End: 2024-09-18
  Administered 2024-09-18: 60 mg via INTRAVENOUS

## 2024-09-18 SURGICAL SUPPLY — 1 items: PAD DEFIB RADIO PHYSIO CONN (PAD) ×1 IMPLANT

## 2024-09-18 NOTE — Interval H&P Note (Signed)
 History and Physical Interval Note:  09/18/2024 7:43 AM  Evan Farmer  has presented today for surgery, with the diagnosis of AFIB.  The various methods of treatment have been discussed with the patient and family. After consideration of risks, benefits and other options for treatment, the patient has consented to  Procedure(s): CARDIOVERSION (N/A) as a surgical intervention.  The patient's history has been reviewed, patient examined, no change in status, stable for surgery.  I have reviewed the patient's chart and labs.  Questions were answered to the patient's satisfaction.     [name] A [name]

## 2024-09-18 NOTE — Discharge Instructions (Signed)

## 2024-09-18 NOTE — CV Procedure (Signed)
 Procedure: Electrical Cardioversion Indications:  Atrial Fibrillation and Atrial Flutter  Procedure Details:  Consent: Risks of procedure as well as the alternatives and risks of each were explained to the (patient/caregiver).  Consent for procedure obtained.  Time Out: Verified patient identification, verified procedure, site/side was marked, verified correct patient position, special equipment/implants available, medications/allergies/relevent history reviewed, required imaging and test results available. PERFORMED.  Patient placed on cardiac monitor, pulse oximetry, supplemental oxygen as necessary.  Sedation given: propofol  per anesthesia Pacer pads placed anterior and posterior chest.  Cardioverted 1 time(s).  Cardioversion with synchronized biphasic 300J shock.  Evaluation: Findings: Post procedure EKG shows: NSR Complications: None Patient did tolerate procedure well.  Time Spent Directly with the Patient:  20 minutes    Arlene Brickel A Merriam Brandner 09/18/2024, 8:07 AM

## 2024-09-18 NOTE — Anesthesia Preprocedure Evaluation (Addendum)
 Anesthesia Evaluation  Patient identified by MRN, date of birth, ID band Patient awake    Reviewed: Allergy & Precautions, NPO status , Patient's Chart, lab work & pertinent test results  Airway Mallampati: II  TM Distance: >3 FB Neck ROM: Full    Dental  (+) Dental Advisory Given   Pulmonary neg pulmonary ROS   Pulmonary exam normal breath sounds clear to auscultation       Cardiovascular hypertension, Pt. on medications + dysrhythmias Atrial Fibrillation  Rhythm:Irregular Rate:Abnormal     Neuro/Psych  PSYCHIATRIC DISORDERS Anxiety Depression    TIA   GI/Hepatic negative GI ROS, Neg liver ROS,,,  Endo/Other  negative endocrine ROS    Renal/GU negative Renal ROS     Musculoskeletal negative musculoskeletal ROS (+)    Abdominal   Peds  Hematology  (+) Blood dyscrasia (Eliquis )   Anesthesia Other Findings Day of surgery medications reviewed with the patient.  Reproductive/Obstetrics                              Anesthesia Physical Anesthesia Plan  ASA: 3  Anesthesia Plan: General   Post-op Pain Management: Minimal or no pain anticipated   Induction: Intravenous  PONV Risk Score and Plan: 2 and TIVA  Airway Management Planned: Mask  Additional Equipment:   Intra-op Plan:   Post-operative Plan:   Informed Consent: I have reviewed the patients History and Physical, chart, labs and discussed the procedure including the risks, benefits and alternatives for the proposed anesthesia with the patient or authorized representative who has indicated his/her understanding and acceptance.     Dental advisory given  Plan Discussed with: CRNA  Anesthesia Plan Comments:          Anesthesia Quick Evaluation

## 2024-09-18 NOTE — Transfer of Care (Signed)
 Immediate Anesthesia Transfer of Care Note  Patient: Evan Farmer  Procedure(s) Performed: CARDIOVERSION  Patient Location: PACU and Cath Lab  Anesthesia Type:General  Level of Consciousness: sedated  Airway & Oxygen Therapy: Patient connected to face mask oxygen  Post-op Assessment: Report given to RN  Post vital signs: stable  Last Vitals:  Vitals Value Taken Time  BP 121/76 09/18/24 08:03  Temp    Pulse 62 09/18/24 08:05  Resp 12 09/18/24 08:05  SpO2 100 % 09/18/24 08:05    Last Pain:  Vitals:   09/18/24 0717  TempSrc: Temporal         Complications: No notable events documented.

## 2024-09-20 ENCOUNTER — Encounter (HOSPITAL_COMMUNITY): Payer: Self-pay | Admitting: Internal Medicine

## 2024-09-21 NOTE — Anesthesia Postprocedure Evaluation (Signed)
 Anesthesia Post Note  Patient: Evan Farmer  Procedure(s) Performed: CARDIOVERSION     Patient location during evaluation: Cath Lab Anesthesia Type: General Level of consciousness: awake and alert Pain management: pain level controlled Vital Signs Assessment: post-procedure vital signs reviewed and stable Respiratory status: spontaneous breathing, nonlabored ventilation and respiratory function stable Cardiovascular status: blood pressure returned to baseline and stable Postop Assessment: no apparent nausea or vomiting Anesthetic complications: no   No notable events documented.  Last Vitals:  Vitals:   09/18/24 0815 09/18/24 0825  BP: 115/80 119/87  Pulse: 62 62  Resp: 11 19  Temp:    SpO2: 96% 96%    Last Pain:  Vitals:   09/18/24 0825  TempSrc:   PainSc: 0-No pain                 Garnette FORBES Skillern

## 2024-09-29 ENCOUNTER — Ambulatory Visit (HOSPITAL_COMMUNITY)
Admission: RE | Admit: 2024-09-29 | Discharge: 2024-09-29 | Disposition: A | Discharge status: Home / Self Care | Source: Ambulatory Visit | Attending: Physician Assistant | Admitting: Physician Assistant

## 2024-09-29 VITALS — BP 124/70 | HR 58 | Ht 68.5 in | Wt 176.2 lb

## 2024-09-29 DIAGNOSIS — Z79899 Other long term (current) drug therapy: Secondary | ICD-10-CM | POA: Diagnosis not present

## 2024-09-29 DIAGNOSIS — Z5181 Encounter for therapeutic drug level monitoring: Secondary | ICD-10-CM | POA: Diagnosis not present

## 2024-09-29 DIAGNOSIS — I4891 Unspecified atrial fibrillation: Secondary | ICD-10-CM

## 2024-09-29 DIAGNOSIS — I4819 Other persistent atrial fibrillation: Secondary | ICD-10-CM | POA: Diagnosis not present

## 2024-09-29 DIAGNOSIS — D6869 Other thrombophilia: Secondary | ICD-10-CM

## 2024-09-29 NOTE — Progress Notes (Signed)
 Primary Care Physician: Garald Karlynn GAILS, MD Referring Physician: self referred Primary EP: Dr. Nancey Lynwood JONELLE Evan Farmer is a 73 y.o. male with a h/o afib, atrial flutter, HTN, HLD, TIA who presents for follow up in the [facility]. Patient had been maintained on flecainide  for several years and had an ablation at [facility] in 1992. He underwent repeat ablation with Dr Nancey for afib and atrial flutter 08/22/22. He was found to be in atypical atrial flutter 12/12/22 and underwent TEE/DCCV on 12/21/22. Unfortunately, he had quick return of his arrhythmia and flecainide  was resumed with repeat DCCV on 01/29/23. He is on Eliquis  for stroke prevention. He went into atrial flutter and underwent another DCCV on 06/10/23. Flecainide  increased at 08/16/23 visit which did not maintain SR. He was seen by Dr Nancey and underwent CTI ablation on 02/17/24. Pulmonary veins were electrically silent from prior ablation so PVI was not performed.   Patient returns for follow up for atrial fibrillation and flecainide  monitoring. He is s/p DCCV on 09/18/24. Patient in SR today and feels well. No bleeding issues on anticoagulation.   Today, he  denies symptoms of palpitations, chest pain, shortness of breath, orthopnea, PND, lower extremity edema, dizziness, presyncope, syncope, snoring, daytime somnolence, bleeding, or neurologic sequela. The patient is tolerating medications without difficulties and is otherwise without complaint today.    Past Medical History:  Diagnosis Date   Anxiety    Cataract    Depression    Diverticulosis    HTN (hypertension)    Hyperlipidemia    Paroxysmal A-fib (HCC)    Prostate cancer [facility])    2010 Dr Andra    [facility] spotted fever    age 32   S/P cardiac cath 02/2011   No obstructive disease. Normal LV function   Scoliosis    TIA (transient ischemic attack)    2012   Tubular adenoma of colon    Vitamin B 12 deficiency    Vitamin D  deficiency     Weight gain     Current Outpatient Medications  Medication Sig Dispense Refill   ALPRAZolam  (XANAX ) 0.5 MG tablet TAKE 1 TABLET BY MOUTH TWICE A DAY AS NEEDED FOR ANXIETY 60 tablet 1   apixaban  (ELIQUIS ) 5 MG TABS tablet Take 1 tablet (5 mg total) by mouth 2 (two) times daily. 180 tablet 2   atenolol  (TENORMIN ) 25 MG tablet TAKE 1 TABLET BY MOUTH 2 TIMES DAILY. MAY TAKE AN EXTRA TABLET TWICE DAILY FOR BREAKTHROUGH AFIB (Patient taking differently: Take 25 mg by mouth 2 (two) times daily.) 270 tablet 3   Cholecalciferol (VITAMIN D3) 50 MCG (2000 UT) capsule Take 1 capsule (2,000 Units total) by mouth daily. 100 capsule 3   diphenhydrAMINE (BENADRYL) 25 MG tablet Take 12.5 mg by mouth at bedtime as needed for sleep.     flecainide  (TAMBOCOR ) 100 MG tablet Take 1 tablet (100 mg total) by mouth 2 (two) times daily. 60 tablet 2   ibuprofen (ADVIL) 200 MG tablet Take 200 mg by mouth every 6 (six) hours as needed for moderate pain (pain score 4-6). (Patient taking differently: Take 200 mg by mouth as needed for moderate pain (pain score 4-6).)     losartan  (COZAAR ) 25 MG tablet TAKE 1 TABLET(25 MG) BY MOUTH DAILY 90 tablet 2   Multiple Vitamins-Minerals (MULTIVITAMIN WITH MINERALS) tablet Take 1 tablet by mouth daily.     sildenafil  (VIAGRA ) 100 MG tablet Take 1 tablet (100 mg  total) by mouth daily as needed for erectile dysfunction. 12 tablet 5   No current facility-administered medications for this encounter.    ROS- All systems are reviewed and negative except as per the HPI above  Physical Exam: Vitals:   09/29/24 0935  BP: 124/70  Pulse: (!) 58  Weight: 79.9 kg  Height: 5' 8.5 (1.74 m)     Wt Readings from Last 3 Encounters:  09/29/24 79.9 kg  09/18/24 78 kg  09/08/24 80 kg    GEN: Well nourished, well developed in no acute distress CARDIAC: Regular rate and rhythm, no murmurs, rubs, gallops RESPIRATORY:  Clear to auscultation without rales, wheezing or rhonchi  ABDOMEN:  Soft, non-tender, non-distended EXTREMITIES:  No edema; No deformity    EKG today demonstrates SB, 1st degree AV block Vent. rate 58 BPM PR interval 214 ms QRS duration 110 ms QT/QTcB 434/426 ms   Echo 07/24/22  1. Left ventricular ejection fraction, by estimation, is 55 to 60%. The left ventricle has normal function. The left ventricle has no regional wall motion abnormalities. Diastolic function indeterminant due to Afib.   2. Right ventricular systolic function is normal. The right ventricular size is normal. There is normal pulmonary artery systolic pressure.   3. Left atrial size was moderately dilated.   4. Right atrial size was mildly dilated.   5. The mitral valve is grossly normal. Trivial mitral valve  regurgitation.   6. The aortic valve is tricuspid. There is mild calcification of the  aortic valve. There is mild thickening of the aortic valve. Aortic valve regurgitation is mild. Aortic valve sclerosis/calcification is present, without any evidence of aortic stenosis.   7. Aortic dilatation noted. There is mild dilatation of the aortic root, measuring 41 mm. There is mild dilatation of the ascending aorta, measuring 41 mm.   8. The inferior vena cava is normal in size with greater than 50%  respiratory variability, suggesting right atrial pressure of 3 mmHg.   Comparison(s): Compard to prior TEE report in 2017, there is no  significant change.    CHA2DS2-VASc Score = 5  The patient's score is based upon: CHF History: 0 HTN History: 1 Diabetes History: 0 Stroke History: 2 (TIA) Vascular Disease History: 1 (aortic atherosclerosis) Age Score: 1 Gender Score: 0       ASSESSMENT AND PLAN: Persistent Atrial Fibrillation/typical and atypical atrial flutter (ICD10:  I48.19) The patient's CHA2DS2-VASc score is 5, indicating a 7.2% annual risk of stroke.   S/p ablation 1992, 08/22/22, 02/17/24 S/p DCCV 09/18/24 Patient appears to be maintaining SR Continue flecainide  100  mg BID Continue atenolol  25 mg BID Continue Eliquis  5 mg BID If he fails flecainide , he would be agreeable to dofetilide admission.  We briefly discussed enrollment in BASICHR study, he would consider after the holiday season. He will reach out if he wants to proceed.   Secondary Hypercoagulable State (ICD10:  D68.69) The patient is at significant risk for stroke/thromboembolism based upon his CHA2DS2-VASc Score of 5.  Continue Apixaban  (Eliquis ). No bleeding issues.   High Risk Medication Monitoring (ICD 10: J342684) Patient requires ongoing monitoring for anti-arrhythmic medication which has the potential to cause life threatening arrhythmias. Intervals on ECG acceptable for flecainide  monitoring.     HTN Stable on current regimen   Follow up in the AF clinic in 6 months.    Daril Kicks PA-C [facility] [address] [place], [place] [address] [phone]

## 2024-10-16 ENCOUNTER — Other Ambulatory Visit (HOSPITAL_COMMUNITY): Payer: Self-pay | Admitting: Physician Assistant

## 2024-12-01 ENCOUNTER — Other Ambulatory Visit: Payer: Self-pay | Admitting: Internal Medicine

## 2024-12-03 ENCOUNTER — Telehealth: Payer: Self-pay

## 2024-12-03 NOTE — Telephone Encounter (Signed)
 Copied from CRM #[id number]. Topic: Clinical - Prescription Issue >> Dec 03, 2024  2:14 PM Olam RAMAN wrote: Reason for CRM: Pt was calling for ALPRAZolam  (XANAX ) 0.5 MG tablet advised medication was pending. Asked pt is he was ok, medication reasons. He said he was fine and will check back tmw

## 2024-12-08 ENCOUNTER — Other Ambulatory Visit (HOSPITAL_COMMUNITY): Payer: Self-pay | Admitting: Physician Assistant

## 2024-12-08 ENCOUNTER — Other Ambulatory Visit: Payer: Self-pay | Admitting: Cardiovascular Disease

## 2024-12-08 DIAGNOSIS — I10 Essential (primary) hypertension: Secondary | ICD-10-CM

## 2025-01-12 ENCOUNTER — Ambulatory Visit: Admitting: Internal Medicine

## 2025-03-30 ENCOUNTER — Ambulatory Visit (HOSPITAL_COMMUNITY): Admitting: Physician Assistant
